# Patient Record
Sex: Male | Born: 1985 | Race: White | Hispanic: No | Marital: Married | Smoking: Former smoker
Health system: Southern US, Community
[De-identification: ages and names within clinical notes are randomized; demographics above are authoritative.]

## PROBLEM LIST (undated history)

## (undated) DIAGNOSIS — Z87442 Personal history of urinary calculi: Secondary | ICD-10-CM

---

## 2015-06-16 ENCOUNTER — Emergency Department (HOSPITAL_COMMUNITY): Payer: 59

## 2015-06-16 ENCOUNTER — Inpatient Hospital Stay (HOSPITAL_COMMUNITY)
Admission: EM | Admit: 2015-06-16 | Discharge: 2015-06-23 | DRG: 023 | Disposition: A | Discharge status: Rehabilitation Facility | Payer: 59 | Attending: General Surgery | Admitting: General Surgery

## 2015-06-16 ENCOUNTER — Encounter (HOSPITAL_COMMUNITY): Payer: Self-pay | Admitting: Emergency Medicine

## 2015-06-16 DIAGNOSIS — J9600 Acute respiratory failure, unspecified whether with hypoxia or hypercapnia: Secondary | ICD-10-CM | POA: Diagnosis present

## 2015-06-16 DIAGNOSIS — K5901 Slow transit constipation: Secondary | ICD-10-CM | POA: Diagnosis not present

## 2015-06-16 DIAGNOSIS — G8111 Spastic hemiplegia affecting right dominant side: Secondary | ICD-10-CM | POA: Diagnosis not present

## 2015-06-16 DIAGNOSIS — Z9289 Personal history of other medical treatment: Secondary | ICD-10-CM

## 2015-06-16 DIAGNOSIS — S0512XA Contusion of eyeball and orbital tissues, left eye, initial encounter: Secondary | ICD-10-CM | POA: Diagnosis present

## 2015-06-16 DIAGNOSIS — R402433 Glasgow coma scale score 3-8, at hospital admission: Secondary | ICD-10-CM | POA: Diagnosis present

## 2015-06-16 DIAGNOSIS — H05232 Hemorrhage of left orbit: Secondary | ICD-10-CM | POA: Diagnosis present

## 2015-06-16 DIAGNOSIS — S0591XS Unspecified injury of right eye and orbit, sequela: Secondary | ICD-10-CM | POA: Diagnosis not present

## 2015-06-16 DIAGNOSIS — Z87820 Personal history of traumatic brain injury: Secondary | ICD-10-CM | POA: Diagnosis present

## 2015-06-16 DIAGNOSIS — G8191 Hemiplegia, unspecified affecting right dominant side: Secondary | ICD-10-CM | POA: Diagnosis present

## 2015-06-16 DIAGNOSIS — W19XXXA Unspecified fall, initial encounter: Secondary | ICD-10-CM | POA: Diagnosis present

## 2015-06-16 DIAGNOSIS — H5704 Mydriasis: Secondary | ICD-10-CM | POA: Diagnosis present

## 2015-06-16 DIAGNOSIS — I609 Nontraumatic subarachnoid hemorrhage, unspecified: Secondary | ICD-10-CM | POA: Diagnosis not present

## 2015-06-16 DIAGNOSIS — W01118A Fall on same level from slipping, tripping and stumbling with subsequent striking against other sharp object, initial encounter: Secondary | ICD-10-CM | POA: Diagnosis present

## 2015-06-16 DIAGNOSIS — H5702 Anisocoria: Secondary | ICD-10-CM | POA: Diagnosis present

## 2015-06-16 DIAGNOSIS — Z23 Encounter for immunization: Secondary | ICD-10-CM

## 2015-06-16 DIAGNOSIS — S069X4S Unspecified intracranial injury with loss of consciousness of 6 hours to 24 hours, sequela: Secondary | ICD-10-CM | POA: Diagnosis not present

## 2015-06-16 DIAGNOSIS — Z978 Presence of other specified devices: Secondary | ICD-10-CM

## 2015-06-16 DIAGNOSIS — S069X0S Unspecified intracranial injury without loss of consciousness, sequela: Secondary | ICD-10-CM | POA: Diagnosis not present

## 2015-06-16 DIAGNOSIS — R339 Retention of urine, unspecified: Secondary | ICD-10-CM | POA: Diagnosis not present

## 2015-06-16 DIAGNOSIS — S066X4S Traumatic subarachnoid hemorrhage with loss of consciousness of 6 hours to 24 hours, sequela: Secondary | ICD-10-CM | POA: Diagnosis not present

## 2015-06-16 DIAGNOSIS — R258 Other abnormal involuntary movements: Secondary | ICD-10-CM | POA: Diagnosis present

## 2015-06-16 DIAGNOSIS — R4182 Altered mental status, unspecified: Secondary | ICD-10-CM | POA: Diagnosis present

## 2015-06-16 DIAGNOSIS — F17210 Nicotine dependence, cigarettes, uncomplicated: Secondary | ICD-10-CM | POA: Diagnosis present

## 2015-06-16 DIAGNOSIS — S069X4A Unspecified intracranial injury with loss of consciousness of 6 hours to 24 hours, initial encounter: Secondary | ICD-10-CM | POA: Diagnosis not present

## 2015-06-16 DIAGNOSIS — S066X9A Traumatic subarachnoid hemorrhage with loss of consciousness of unspecified duration, initial encounter: Secondary | ICD-10-CM | POA: Diagnosis present

## 2015-06-16 DIAGNOSIS — S0591XA Unspecified injury of right eye and orbit, initial encounter: Secondary | ICD-10-CM | POA: Diagnosis present

## 2015-06-16 DIAGNOSIS — R402412 Glasgow coma scale score 13-15, at arrival to emergency department: Secondary | ICD-10-CM | POA: Diagnosis present

## 2015-06-16 DIAGNOSIS — J9690 Respiratory failure, unspecified, unspecified whether with hypoxia or hypercapnia: Secondary | ICD-10-CM

## 2015-06-16 DIAGNOSIS — S06369A Traumatic hemorrhage of cerebrum, unspecified, with loss of consciousness of unspecified duration, initial encounter: Secondary | ICD-10-CM | POA: Diagnosis present

## 2015-06-16 DIAGNOSIS — G9340 Encephalopathy, unspecified: Secondary | ICD-10-CM | POA: Diagnosis not present

## 2015-06-16 DIAGNOSIS — H468 Other optic neuritis: Secondary | ICD-10-CM | POA: Diagnosis present

## 2015-06-16 DIAGNOSIS — J9601 Acute respiratory failure with hypoxia: Secondary | ICD-10-CM | POA: Diagnosis not present

## 2015-06-16 DIAGNOSIS — S066X4A Traumatic subarachnoid hemorrhage with loss of consciousness of 6 hours to 24 hours, initial encounter: Secondary | ICD-10-CM | POA: Diagnosis not present

## 2015-06-16 DIAGNOSIS — S0592XS Unspecified injury of left eye and orbit, sequela: Secondary | ICD-10-CM | POA: Diagnosis not present

## 2015-06-16 DIAGNOSIS — H4901 Third [oculomotor] nerve palsy, right eye: Secondary | ICD-10-CM | POA: Diagnosis present

## 2015-06-16 DIAGNOSIS — G44309 Post-traumatic headache, unspecified, not intractable: Secondary | ICD-10-CM | POA: Diagnosis not present

## 2015-06-16 HISTORY — DX: Personal history of urinary calculi: Z87.442

## 2015-06-16 LAB — TYPE AND SCREEN
ABO/RH(D): A NEG
Antibody Screen: NEGATIVE
Unit division: 0
Unit division: 0

## 2015-06-16 LAB — CBC
HCT: 43 % (ref 39.0–52.0)
HEMOGLOBIN: 14.5 g/dL (ref 13.0–17.0)
MCH: 28 pg (ref 26.0–34.0)
MCHC: 33.7 g/dL (ref 30.0–36.0)
MCV: 83.2 fL (ref 78.0–100.0)
Platelets: 205 10*3/uL (ref 150–400)
RBC: 5.17 MIL/uL (ref 4.22–5.81)
RDW: 13.1 % (ref 11.5–15.5)
WBC: 9.4 10*3/uL (ref 4.0–10.5)

## 2015-06-16 LAB — COMPREHENSIVE METABOLIC PANEL
ALT: 29 U/L (ref 17–63)
ANION GAP: 10 (ref 5–15)
AST: 22 U/L (ref 15–41)
Albumin: 4.1 g/dL (ref 3.5–5.0)
Alkaline Phosphatase: 58 U/L (ref 38–126)
BUN: 11 mg/dL (ref 6–20)
CHLORIDE: 100 mmol/L — AB (ref 101–111)
CO2: 27 mmol/L (ref 22–32)
Calcium: 9 mg/dL (ref 8.9–10.3)
Creatinine, Ser: 1.07 mg/dL (ref 0.61–1.24)
GFR calc non Af Amer: >60 mL/min (ref >60)
Glucose, Bld: 100 mg/dL — ABNORMAL HIGH (ref 65–99)
Potassium: 3.6 mmol/L (ref 3.5–5.1)
SODIUM: 137 mmol/L (ref 135–145)
Total Bilirubin: 0.5 mg/dL (ref 0.3–1.2)
Total Protein: 6.5 g/dL (ref 6.5–8.1)

## 2015-06-16 LAB — ETHANOL

## 2015-06-16 LAB — ABO/RH: ABO/RH(D): A NEG

## 2015-06-16 LAB — PROTIME-INR
INR: 1.07 (ref 0.00–1.49)
Prothrombin Time: 14.1 seconds (ref 11.6–15.2)

## 2015-06-16 MED ORDER — IOHEXOL 300 MG/ML  SOLN
100.0000 mL | Freq: Once | INTRAMUSCULAR | Status: AC | PRN
  Administered 2015-06-16: 100 mL via INTRAVENOUS

## 2015-06-16 MED ORDER — FENTANYL CITRATE (PF) 100 MCG/2ML IJ SOLN
INTRAMUSCULAR | Status: AC
  Administered 2015-06-16: 100 ug
  Filled 2015-06-16: qty 2

## 2015-06-16 MED ORDER — LIDOCAINE-EPINEPHRINE (PF) 2 %-1:200000 IJ SOLN
10.0000 mL | Freq: Once | INTRAMUSCULAR | Status: AC
  Filled 2015-06-16: qty 20

## 2015-06-16 MED ORDER — FENTANYL CITRATE (PF) 100 MCG/2ML IJ SOLN
100.0000 ug | Freq: Once | INTRAMUSCULAR | Status: AC
  Administered 2015-06-16: 100 ug via INTRAVENOUS

## 2015-06-16 MED ORDER — PROPOFOL 1000 MG/100ML IV EMUL
5.0000 ug/kg/min | Freq: Once | INTRAVENOUS | Status: DC
  Administered 2015-06-16: 10 ug/kg/min via INTRAVENOUS

## 2015-06-16 MED ORDER — FENTANYL CITRATE (PF) 100 MCG/2ML IJ SOLN
INTRAMUSCULAR | Status: AC
  Filled 2015-06-16: qty 2

## 2015-06-16 MED ORDER — LIDOCAINE-EPINEPHRINE (PF) 2 %-1:200000 IJ SOLN
INTRAMUSCULAR | Status: AC
  Administered 2015-06-16: 20 mL
  Filled 2015-06-16: qty 20

## 2015-06-16 NOTE — ED Notes (Signed)
100 of roc given

## 2015-06-16 NOTE — ED Notes (Signed)
30 etomidate given
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
## 2015-06-16 NOTE — ED Notes (Signed)
Pt in CT with Everlena Cooper, Primary RN, and Dr. Ladona Ridgel, and Dr. Rush Landmark.
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
## 2015-06-16 NOTE — ED Notes (Signed)
Chaplain and dr. Ladona Ridgel updating family. Lidocaine present

## 2015-06-16 NOTE — ED Notes (Signed)
Repeat toco pressure 23.

## 2015-06-16 NOTE — ED Notes (Signed)
CT room 1 ready

## 2015-06-16 NOTE — ED Notes (Signed)
Primary RN Everlena Cooper, Dr. Madilyn Hook, Dr. Joyice Faster, and Dr. Rush Landmark at the bedside on arrival.

## 2015-06-16 NOTE — ED Notes (Signed)
100mcg of fentanyl given

## 2015-06-16 NOTE — ED Notes (Signed)
Clarified with Dr. Ladona Ridgel and Dr. Madilyn Hook, patient's propofol to be continued for now. Increased to 76mcg/kg/min. Verbal order for repeat dose of fentanyl per Dr. Madilyn Hook.

## 2015-06-16 NOTE — ED Notes (Signed)
Paged Neurosurgery/JONES to [facility] @ [phone]

## 2015-06-16 NOTE — ED Notes (Signed)
100 succ given 

## 2015-06-16 NOTE — ED Notes (Signed)
toco pressure to left eye 40 per Dr. Ladona Ridgel

## 2015-06-16 NOTE — ED Notes (Signed)
AC present in CT.

## 2015-06-17 ENCOUNTER — Encounter (HOSPITAL_COMMUNITY): Payer: Self-pay | Admitting: Radiology

## 2015-06-17 ENCOUNTER — Inpatient Hospital Stay (HOSPITAL_COMMUNITY): Payer: 59

## 2015-06-17 DIAGNOSIS — J9601 Acute respiratory failure with hypoxia: Secondary | ICD-10-CM

## 2015-06-17 DIAGNOSIS — I609 Nontraumatic subarachnoid hemorrhage, unspecified: Secondary | ICD-10-CM

## 2015-06-17 DIAGNOSIS — G9340 Encephalopathy, unspecified: Secondary | ICD-10-CM

## 2015-06-17 LAB — BASIC METABOLIC PANEL
Anion gap: 12 (ref 5–15)
BUN: 10 mg/dL (ref 6–20)
CALCIUM: 8.9 mg/dL (ref 8.9–10.3)
CO2: 27 mmol/L (ref 22–32)
Chloride: 97 mmol/L — ABNORMAL LOW (ref 101–111)
Creatinine, Ser: 0.96 mg/dL (ref 0.61–1.24)
GFR calc Af Amer: >60 mL/min (ref >60)
GFR calc non Af Amer: >60 mL/min (ref >60)
GLUCOSE: 112 mg/dL — AB (ref 65–99)
Potassium: 3.8 mmol/L (ref 3.5–5.1)
Sodium: 136 mmol/L (ref 135–145)

## 2015-06-17 LAB — PREPARE FRESH FROZEN PLASMA: Unit division: 0

## 2015-06-17 LAB — CBC
HCT: 42.2 % (ref 39.0–52.0)
Hemoglobin: 14.1 g/dL (ref 13.0–17.0)
MCH: 28.2 pg (ref 26.0–34.0)
MCHC: 33.4 g/dL (ref 30.0–36.0)
MCV: 84.4 fL (ref 78.0–100.0)
PLATELETS: 208 10*3/uL (ref 150–400)
RBC: 5 MIL/uL (ref 4.22–5.81)
RDW: 13.5 % (ref 11.5–15.5)
WBC: 13.4 10*3/uL — ABNORMAL HIGH (ref 4.0–10.5)

## 2015-06-17 LAB — URINALYSIS, ROUTINE W REFLEX MICROSCOPIC
BILIRUBIN URINE: NEGATIVE
Glucose, UA: NEGATIVE mg/dL
Ketones, ur: NEGATIVE mg/dL
Leukocytes, UA: NEGATIVE
NITRITE: NEGATIVE
Protein, ur: NEGATIVE mg/dL
SPECIFIC GRAVITY, URINE: 1.02 (ref 1.005–1.030)
UROBILINOGEN UA: 0.2 mg/dL (ref 0.0–1.0)
pH: 6 (ref 5.0–8.0)

## 2015-06-17 LAB — BLOOD GAS, ARTERIAL
ACID-BASE EXCESS: 0.9 mmol/L (ref 0.0–2.0)
BICARBONATE: 25.5 meq/L — AB (ref 20.0–24.0)
Drawn by: 41308
FIO2: 0.4
LHR: 14 {breaths}/min
O2 SAT: 99.2 %
PEEP/CPAP: 5 cmH2O
PH ART: 7.377 (ref 7.350–7.450)
Patient temperature: 98.6
TCO2: 26.9 mmol/L (ref 0–100)
VT: 500 mL
pCO2 arterial: 44.5 mmHg (ref 35.0–45.0)
pO2, Arterial: 166 mmHg — ABNORMAL HIGH (ref 80.0–100.0)

## 2015-06-17 LAB — BLOOD PRODUCT ORDER (VERBAL) VERIFICATION

## 2015-06-17 LAB — RAPID URINE DRUG SCREEN, HOSP PERFORMED
Amphetamines: NOT DETECTED
BARBITURATES: NOT DETECTED
Benzodiazepines: NOT DETECTED
COCAINE: NOT DETECTED
Opiates: NOT DETECTED
TETRAHYDROCANNABINOL: NOT DETECTED

## 2015-06-17 LAB — URINE MICROSCOPIC-ADD ON

## 2015-06-17 LAB — MRSA PCR SCREENING: MRSA by PCR: NEGATIVE

## 2015-06-17 MED ORDER — FENTANYL CITRATE (PF) 100 MCG/2ML IJ SOLN
100.0000 ug | Freq: Once | INTRAMUSCULAR | Status: AC
  Administered 2015-06-17: 100 ug via INTRAVENOUS

## 2015-06-17 MED ORDER — CEPHALEXIN 500 MG PO CAPS
500.0000 mg | ORAL_CAPSULE | Freq: Three times a day (TID) | ORAL | Status: DC
  Administered 2015-06-17 (×2): 500 mg via ORAL
  Filled 2015-06-17 (×4): qty 1

## 2015-06-17 MED ORDER — ROCURONIUM BROMIDE 50 MG/5ML IV SOLN
INTRAVENOUS | Status: AC
  Filled 2015-06-17: qty 2

## 2015-06-17 MED ORDER — ACETAMINOPHEN 160 MG/5ML PO SOLN
650.0000 mg | Freq: Four times a day (QID) | ORAL | Status: DC | PRN
  Administered 2015-06-17 – 2015-06-21 (×5): 650 mg via ORAL
  Filled 2015-06-17 (×5): qty 20.3

## 2015-06-17 MED ORDER — IOHEXOL 350 MG/ML SOLN
50.0000 mL | Freq: Once | INTRAVENOUS | Status: AC | PRN
  Administered 2015-06-17: 50 mL via INTRAVENOUS

## 2015-06-17 MED ORDER — ONDANSETRON HCL 4 MG PO TABS
4.0000 mg | ORAL_TABLET | Freq: Four times a day (QID) | ORAL | Status: DC | PRN
Start: 2015-06-17 — End: 2015-06-23

## 2015-06-17 MED ORDER — ONDANSETRON HCL 4 MG/2ML IJ SOLN: 4.0000 mg | Freq: Four times a day (QID) | INTRAMUSCULAR | Status: DC | PRN

## 2015-06-17 MED ORDER — MIDAZOLAM HCL 2 MG/2ML IJ SOLN: 2.0000 mg | INTRAMUSCULAR | Status: DC | PRN

## 2015-06-17 MED ORDER — PROPOFOL 1000 MG/100ML IV EMUL
INTRAVENOUS | Status: AC
  Administered 2015-06-17: 50 ug/kg/min via INTRAVENOUS
  Filled 2015-06-17: qty 100

## 2015-06-17 MED ORDER — PANTOPRAZOLE SODIUM 40 MG PO TBEC
40.0000 mg | DELAYED_RELEASE_TABLET | Freq: Every day | ORAL | Status: DC
  Administered 2015-06-20 – 2015-06-21 (×2): 40 mg via ORAL
  Filled 2015-06-17 (×2): qty 1

## 2015-06-17 MED ORDER — SODIUM CHLORIDE 0.9 % IV SOLN
40.0000 ug/h | INTRAVENOUS | Status: DC
  Administered 2015-06-17: 40 ug/h via INTRAVENOUS
  Filled 2015-06-17: qty 50

## 2015-06-17 MED ORDER — ANTISEPTIC ORAL RINSE SOLUTION (CORINZ)
7.0000 mL | OROMUCOSAL | Status: DC
  Administered 2015-06-17 – 2015-06-18 (×12): 7 mL via OROMUCOSAL

## 2015-06-17 MED ORDER — SODIUM CHLORIDE 0.9 % IV BOLUS (SEPSIS)
1000.0000 mL | Freq: Once | INTRAVENOUS | Status: AC
  Administered 2015-06-18: 1000 mL via INTRAVENOUS

## 2015-06-17 MED ORDER — PANTOPRAZOLE SODIUM 40 MG IV SOLR
40.0000 mg | Freq: Every day | INTRAVENOUS | Status: DC
  Administered 2015-06-17 – 2015-06-19 (×3): 40 mg via INTRAVENOUS
  Filled 2015-06-17 (×3): qty 40

## 2015-06-17 MED ORDER — CIPROFLOXACIN HCL 0.3 % OP SOLN
2.0000 [drp] | Freq: Once | OPHTHALMIC | Status: AC
  Administered 2015-06-17: 2 [drp] via OPHTHALMIC
  Filled 2015-06-17: qty 2.5

## 2015-06-17 MED ORDER — SODIUM CHLORIDE 0.9 % IV SOLN
25.0000 ug/h | INTRAVENOUS | Status: DC
  Administered 2015-06-18: 150 ug/h via INTRAVENOUS
  Administered 2015-06-18: 200 ug/h via INTRAVENOUS
  Administered 2015-06-19: 275 ug/h via INTRAVENOUS
  Filled 2015-06-17 (×4): qty 50

## 2015-06-17 MED ORDER — LIDOCAINE HCL (CARDIAC) 20 MG/ML IV SOLN
INTRAVENOUS | Status: AC
  Filled 2015-06-17: qty 5

## 2015-06-17 MED ORDER — ERYTHROMYCIN 5 MG/GM OP OINT
TOPICAL_OINTMENT | Freq: Three times a day (TID) | OPHTHALMIC | Status: DC
  Administered 2015-06-17: 16:00:00 via OPHTHALMIC
  Administered 2015-06-17 – 2015-06-18 (×3): 1 via OPHTHALMIC
  Administered 2015-06-18: 21:00:00 via OPHTHALMIC
  Administered 2015-06-18: 1 via OPHTHALMIC
  Administered 2015-06-19 – 2015-06-22 (×12): via OPHTHALMIC
  Administered 2015-06-23: 1 via OPHTHALMIC
  Filled 2015-06-17: qty 3.5

## 2015-06-17 MED ORDER — PROPOFOL 1000 MG/100ML IV EMUL
INTRAVENOUS | Status: AC
  Filled 2015-06-17: qty 100

## 2015-06-17 MED ORDER — ROCURONIUM BROMIDE 50 MG/5ML IV SOLN
100.0000 mg | Freq: Once | INTRAVENOUS | Status: AC
  Administered 2015-06-17: 100 mg via INTRAVENOUS
  Filled 2015-06-17: qty 10

## 2015-06-17 MED ORDER — FENTANYL BOLUS VIA INFUSION
50.0000 ug | INTRAVENOUS | Status: DC | PRN
  Filled 2015-06-17: qty 50

## 2015-06-17 MED ORDER — FENTANYL CITRATE (PF) 100 MCG/2ML IJ SOLN
INTRAMUSCULAR | Status: AC
  Administered 2015-06-17: 100 ug
  Filled 2015-06-17: qty 2

## 2015-06-17 MED ORDER — TETANUS-DIPHTH-ACELL PERTUSSIS 5-2.5-18.5 LF-MCG/0.5 IM SUSP
INTRAMUSCULAR | Status: AC
  Filled 2015-06-17: qty 0.5

## 2015-06-17 MED ORDER — PROPOFOL 1000 MG/100ML IV EMUL
5.0000 ug/kg/min | INTRAVENOUS | Status: DC
  Administered 2015-06-17: 45 ug/kg/min via INTRAVENOUS
  Administered 2015-06-17: 70 ug/kg/min via INTRAVENOUS
  Administered 2015-06-17: 80 ug/kg/min via INTRAVENOUS
  Administered 2015-06-17: 60 ug/kg/min via INTRAVENOUS
  Administered 2015-06-17 (×2): 50 ug/kg/min via INTRAVENOUS
  Administered 2015-06-18: 30 ug/kg/min via INTRAVENOUS
  Administered 2015-06-18 (×2): 40 ug/kg/min via INTRAVENOUS
  Filled 2015-06-17 (×3): qty 100
  Filled 2015-06-17: qty 200
  Filled 2015-06-17 (×4): qty 100

## 2015-06-17 MED ORDER — ETOMIDATE 2 MG/ML IV SOLN
INTRAVENOUS | Status: AC
  Filled 2015-06-17: qty 20

## 2015-06-17 MED ORDER — SODIUM CHLORIDE 0.9 % IV SOLN
INTRAVENOUS | Status: DC
  Administered 2015-06-17 – 2015-06-18 (×5): via INTRAVENOUS
  Administered 2015-06-19: 100 mL via INTRAVENOUS
  Administered 2015-06-19 – 2015-06-22 (×6): via INTRAVENOUS

## 2015-06-17 MED ORDER — CEPHALEXIN 250 MG/5ML PO SUSR
500.0000 mg | Freq: Three times a day (TID) | ORAL | Status: DC
  Administered 2015-06-17 – 2015-06-19 (×5): 500 mg
  Filled 2015-06-17 (×8): qty 10

## 2015-06-17 MED ORDER — CHLORHEXIDINE GLUCONATE 0.12% ORAL RINSE (MEDLINE KIT)
15.0000 mL | Freq: Two times a day (BID) | OROMUCOSAL | Status: DC
  Administered 2015-06-17 – 2015-06-19 (×5): 15 mL via OROMUCOSAL

## 2015-06-17 MED ORDER — TETANUS-DIPHTH-ACELL PERTUSSIS 5-2.5-18.5 LF-MCG/0.5 IM SUSP
0.5000 mL | Freq: Once | INTRAMUSCULAR | Status: AC
  Administered 2015-06-17: 0.5 mL via INTRAMUSCULAR

## 2015-06-17 MED ORDER — SUCCINYLCHOLINE CHLORIDE 20 MG/ML IJ SOLN
INTRAMUSCULAR | Status: AC
  Filled 2015-06-17: qty 1

## 2015-06-17 MED ORDER — FENTANYL CITRATE (PF) 100 MCG/2ML IJ SOLN: 50.0000 ug | Freq: Once | INTRAMUSCULAR | Status: AC

## 2015-06-17 MED ORDER — FENTANYL CITRATE (PF) 100 MCG/2ML IJ SOLN: 50.0000 ug | INTRAMUSCULAR | Status: DC | PRN
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
## 2015-06-17 NOTE — Care Management Note (Signed)
Case Management Note  Patient Details  Name: Tyshan Enderle MRN: [id number] Date of Birth: 02-25-1986  Subjective/Objective:                    Action/Plan:  UR completed .  Expected Discharge Date:                  Expected Discharge Plan:     In-House Referral:     Discharge planning Services     Post Acute Care Choice:    Choice offered to:     DME Arranged:    DME Agency:     HH Arranged:    HH Agency:     Status of Service:  In process, will continue to follow  Medicare Important Message Given:    Date Medicare IM Given:    Medicare IM give by:    Date Additional Medicare IM Given:    Additional Medicare Important Message give by:     If discussed at Long Length of Stay Meetings, dates discussed:    Additional Comments:  Kingsley Plan, RN 06/17/2015, 8:07 AM
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
## 2015-06-17 NOTE — ED Notes (Signed)
Awaiting MD to allow transfer to unit, CTA to be done prior to floor per Dr.

## 2015-06-17 NOTE — ED Notes (Signed)
Verbal restraint order from Dr. Ladona Ridgel.
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
## 2015-06-17 NOTE — ED Notes (Signed)
Neurosurgeon at the bedside for placement of bolt. Dr. Ladona Ridgel called to speak to wife to update. Jasmine from 3w present at the bedside.
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
## 2015-06-17 NOTE — ED Notes (Signed)
Preparing for transport to ct. Rocuronium prior to transport.

## 2015-06-17 NOTE — ED Notes (Signed)
Neuro surgeon at the bedside

## 2015-06-17 NOTE — ED Notes (Signed)
Dr. Cathey Endow at the bedside.

## 2015-06-17 NOTE — ED Notes (Signed)
Everlena Cooper, RN, respiratory therapist, and Sharia Reeve, emt travel to CT for CTA.
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
## 2015-06-17 NOTE — Progress Notes (Signed)
   06/17/15 1402  Clinical Encounter Type  Visited With Patient and family together;Health care provider  Visit Type Initial;Spiritual support  Referral From Chaplain  Spiritual Encounters  Spiritual Needs Prayer;Emotional   Chaplain visited with family, offered emotional support and prayer. Chaplain support available as needed.   Alda Ponder, Chaplain 06/17/2015 2:04 PM
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
## 2015-06-18 ENCOUNTER — Inpatient Hospital Stay (HOSPITAL_COMMUNITY): Payer: 59

## 2015-06-18 LAB — POCT I-STAT 3, ART BLOOD GAS (G3+)
Acid-Base Excess: 1 mmol/L (ref 0.0–2.0)
BICARBONATE: 25.6 meq/L — AB (ref 20.0–24.0)
O2 Saturation: 98 %
PCO2 ART: 41.1 mmHg (ref 35.0–45.0)
TCO2: 27 mmol/L (ref 0–100)
pH, Arterial: 7.407 (ref 7.350–7.450)
pO2, Arterial: 118 mmHg — ABNORMAL HIGH (ref 80.0–100.0)

## 2015-06-18 LAB — BASIC METABOLIC PANEL
ANION GAP: 9 (ref 5–15)
BUN: 8 mg/dL (ref 6–20)
CHLORIDE: 105 mmol/L (ref 101–111)
CO2: 26 mmol/L (ref 22–32)
Calcium: 8.5 mg/dL — ABNORMAL LOW (ref 8.9–10.3)
Creatinine, Ser: 1 mg/dL (ref 0.61–1.24)
GFR calc non Af Amer: >60 mL/min (ref >60)
GLUCOSE: 92 mg/dL (ref 65–99)
POTASSIUM: 3.6 mmol/L (ref 3.5–5.1)
Sodium: 140 mmol/L (ref 135–145)

## 2015-06-18 LAB — CBC
HEMATOCRIT: 40.3 % (ref 39.0–52.0)
HEMOGLOBIN: 13.2 g/dL (ref 13.0–17.0)
MCH: 28 pg (ref 26.0–34.0)
MCHC: 32.8 g/dL (ref 30.0–36.0)
MCV: 85.6 fL (ref 78.0–100.0)
Platelets: 141 10*3/uL — ABNORMAL LOW (ref 150–400)
RBC: 4.71 MIL/uL (ref 4.22–5.81)
RDW: 13.6 % (ref 11.5–15.5)
WBC: 8.1 10*3/uL (ref 4.0–10.5)

## 2015-06-18 LAB — TRIGLYCERIDES: Triglycerides: 143 mg/dL (ref <150)

## 2015-06-18 LAB — MAGNESIUM: MAGNESIUM: 2 mg/dL (ref 1.7–2.4)

## 2015-06-18 LAB — PHOSPHORUS: Phosphorus: 2.9 mg/dL (ref 2.5–4.6)

## 2015-06-18 MED ORDER — MIDAZOLAM HCL 2 MG/2ML IJ SOLN
1.0000 mg | INTRAMUSCULAR | Status: DC | PRN
  Administered 2015-06-18 – 2015-06-19 (×3): 2 mg via INTRAVENOUS
  Filled 2015-06-18 (×3): qty 2

## 2015-06-18 MED ORDER — GADOBENATE DIMEGLUMINE 529 MG/ML IV SOLN
20.0000 mL | Freq: Once | INTRAVENOUS | Status: AC | PRN
  Administered 2015-06-18: 20 mL via INTRAVENOUS

## 2015-06-18 MED ORDER — ANTISEPTIC ORAL RINSE SOLUTION (CORINZ)
7.0000 mL | Freq: Four times a day (QID) | OROMUCOSAL | Status: DC
  Administered 2015-06-18 – 2015-06-20 (×8): 7 mL via OROMUCOSAL
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
## 2015-06-18 NOTE — Progress Notes (Signed)
Pt transported down to MRI on vent. Pt placed on 100% throughout transport, no complications noted.

## 2015-06-19 ENCOUNTER — Inpatient Hospital Stay (HOSPITAL_COMMUNITY): Payer: 59

## 2015-06-19 LAB — RENAL FUNCTION PANEL
ALBUMIN: 2.8 g/dL — AB (ref 3.5–5.0)
ANION GAP: 12 (ref 5–15)
BUN: 5 mg/dL — ABNORMAL LOW (ref 6–20)
CALCIUM: 8.5 mg/dL — AB (ref 8.9–10.3)
CO2: 24 mmol/L (ref 22–32)
CREATININE: 0.81 mg/dL (ref 0.61–1.24)
Chloride: 103 mmol/L (ref 101–111)
GFR calc non Af Amer: >60 mL/min (ref >60)
GLUCOSE: 83 mg/dL (ref 65–99)
PHOSPHORUS: 2.3 mg/dL — AB (ref 2.5–4.6)
Potassium: 4 mmol/L (ref 3.5–5.1)
SODIUM: 139 mmol/L (ref 135–145)

## 2015-06-19 LAB — POCT I-STAT 3, ART BLOOD GAS (G3+)
ACID-BASE DEFICIT: 1 mmol/L (ref 0.0–2.0)
Bicarbonate: 24.5 meq/L — ABNORMAL HIGH (ref 20.0–24.0)
O2 SAT: 99 %
PCO2 ART: 46.6 mmHg — AB (ref 35.0–45.0)
TCO2: 26 mmol/L (ref 0–100)
pH, Arterial: 7.333 — ABNORMAL LOW (ref 7.350–7.450)
pO2, Arterial: 138 mmHg — ABNORMAL HIGH (ref 80.0–100.0)

## 2015-06-19 LAB — CBC
HEMATOCRIT: 37 % — AB (ref 39.0–52.0)
HEMOGLOBIN: 12.3 g/dL — AB (ref 13.0–17.0)
MCH: 28.3 pg (ref 26.0–34.0)
MCHC: 33.2 g/dL (ref 30.0–36.0)
MCV: 85.1 fL (ref 78.0–100.0)
Platelets: 146 10*3/uL — ABNORMAL LOW (ref 150–400)
RBC: 4.35 MIL/uL (ref 4.22–5.81)
RDW: 13.2 % (ref 11.5–15.5)
WBC: 9.6 10*3/uL (ref 4.0–10.5)

## 2015-06-19 LAB — TRIGLYCERIDES: TRIGLYCERIDES: 147 mg/dL (ref <150)

## 2015-06-19 LAB — MAGNESIUM: MAGNESIUM: 1.7 mg/dL (ref 1.7–2.4)

## 2015-06-19 MED ORDER — FENTANYL CITRATE (PF) 100 MCG/2ML IJ SOLN
25.0000 ug | INTRAMUSCULAR | Status: DC | PRN
  Administered 2015-06-19 – 2015-06-20 (×9): 100 ug via INTRAVENOUS
  Administered 2015-06-20: 50 ug via INTRAVENOUS
  Administered 2015-06-21: 100 ug via INTRAVENOUS
  Administered 2015-06-21: 50 ug via INTRAVENOUS
  Filled 2015-06-19 (×12): qty 2

## 2015-06-19 MED ORDER — PIVOT 1.5 CAL PO LIQD
1000.0000 mL | ORAL | Status: DC
  Filled 2015-06-19 (×3): qty 1000

## 2015-06-19 MED ORDER — PRO-STAT SUGAR FREE PO LIQD: 60.0000 mL | Freq: Four times a day (QID) | ORAL | Status: DC

## 2015-06-19 MED ORDER — FENTANYL CITRATE (PF) 100 MCG/2ML IJ SOLN
25.0000 ug | INTRAMUSCULAR | Status: DC | PRN
  Administered 2015-06-19: 50 ug via INTRAVENOUS
  Administered 2015-06-19: 100 ug via INTRAVENOUS
  Administered 2015-06-19: 50 ug via INTRAVENOUS
  Filled 2015-06-19 (×3): qty 2
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
## 2015-06-19 NOTE — Procedures (Signed)
Extubation Procedure Note  Patient Details:   Name: Dakota [name] DOB: November 16, 1985 MRN: [id number]   Airway Documentation:  Airway (Active)  Secured at (cm) 25 cm 06/19/2015  7:41 AM  Measured From Lips 06/19/2015  7:41 AM  Secured Location Right 06/19/2015  7:41 AM  Secured By [employer] 06/19/2015  7:41 AM  Tube Holder Repositioned Yes 06/19/2015  7:41 AM  Cuff Pressure (cm H2O) 24 cm H2O 06/19/2015  7:41 AM  Site Condition Dry 06/19/2015  7:41 AM    Evaluation  O2 sats: stable throughout Complications: No apparent complications Patient did tolerate procedure well. Bilateral Breath Sounds: Clear Suctioning: Oral, Airway Yes   Positive cuff leak prior to extubation   Ok Anis, MA 06/19/2015, 10:47 AM

## 2015-06-19 NOTE — Progress Notes (Signed)
PULMONARY / CRITICAL CARE MEDICINE   Name: Dakota [name] MRN: [id number] DOB: Jan 20, 1986    ADMISSION DATE:  06/16/2015 CONSULTATION DATE:  06/17/2015  REFERRING MD :  Trauma MD  CHIEF COMPLAINT:  Vent management  INITIAL PRESENTATION: 29 year old male with no significant PMH who came back from grocery shopping.  Was in his garage then called out to his wife.  She came in he was in the floor, his left eye was proptotic and bleeding.  Brought to the ED, was oriented only to self and only said antenna.  Was noted to not be protecting his airway and had a SAH.  Was sedated and intubated.  Left pupil at the time was blown.  PCCM consulted for vent and ICU management.  STUDIES:  Head CT 10/6 with SAH  SIGNIFICANT EVENTS: 10/6 unknown trauma, intubated.  HISTORY OF PRESENT ILLNESS:  29 year old male with no significant PMH who came back from grocery shopping.  Was in his garage then called out to his wife.  She came in he was in the floor, his left eye was proptotic and bleeding.  Brought to the ED, was oriented only to self and only said antenna.  Was noted to not be protecting his airway and had a SAH.  Was sedated and intubated.  Left pupil at the time was blown.  PCCM consulted for vent and ICU management.  SUBJECTIVE: Awake and interactive on the left not right.  VITAL SIGNS: Temp:  [98.8 F (37.1 C)-101.8 F (38.8 C)] 99.9 F (37.7 C) (10/09 0900) Pulse Rate:  [58-124] 96 (10/09 0900) Resp:  [13-19] 14 (10/09 0900) BP: (102-145)/(49-89) 124/65 mmHg (10/09 0900) SpO2:  [87 %-100 %] 98 % (10/09 0900) FiO2 (%):  [30 %-40 %] 30 % (10/09 0823) HEMODYNAMICS:   VENTILATOR SETTINGS: Vent Mode:  [-] PRVC FiO2 (%):  [30 %-40 %] 30 % Set Rate:  [14 bmp] 14 bmp Vt Set:  [500 mL] 500 mL PEEP:  [5 cmH20] 5 cmH20 Pressure Support:  [10 cmH20] 10 cmH20 Plateau Pressure:  [13 cmH20-19 cmH20] 15 cmH20 INTAKE / OUTPUT:  Intake/Output Summary (Last 24 hours) at 06/19/15 0959 Last data filed  at 06/19/15 0900  Gross per 24 hour  Intake 3154.07 ml  Output   2223 ml  Net 931.07 ml    PHYSICAL EXAMINATION: General:  Well built male, acutely ill.  Alert and interactive. Neuro:  Intubated, moves left to command, right is paralyzed. HEENT:  Ventric out, left eye proptotic and pupil is dilated and non-reactive. Cardiovascular:  RRR, Nl S1/S2, -M/R/G. Lungs:  CTA bilaterally. Abdomen:  Soft, NT, ND and +BS. Musculoskeletal:  -edema and -tenderness. Skin:  Intact.  LABS:  CBC  Recent Labs Lab 06/17/15 0350 06/18/15 0230 06/19/15 0516  WBC 13.4* 8.1 9.6  HGB 14.1 13.2 12.3*  HCT 42.2 40.3 37.0*  PLT 208 141* 146*   Coag's  Recent Labs Lab 06/16/15 2158  INR 1.07   BMET  Recent Labs Lab 06/17/15 0350 06/18/15 0230 06/19/15 0516  NA 136 140 139  K 3.8 3.6 4.0  CL 97* 105 103  CO2 BUN 10 8 5*  CREATININE 0.96 1.00 0.81  GLUCOSE 112* 92 83   Electrolytes  Recent Labs Lab 06/17/15 0350 06/18/15 0230 06/19/15 0516  CALCIUM 8.9 8.5* 8.5*  MG  --  2.0 1.7  PHOS  --  2.9 2.3*   Sepsis Markers No results for input(s): LATICACIDVEN, PROCALCITON, O2SATVEN in the  last 168 hours. ABG  Recent Labs Lab 06/17/15 0230 06/18/15 0351 06/19/15 0359  PHART 7.377 7.407 7.333*  PCO2ART 44.5 41.1 46.6*  PO2ART 166* 118.0* 138.0*   Liver Enzymes  Recent Labs Lab 06/16/15 2158 06/19/15 0516  AST 22  --   ALT 29  --   ALKPHOS 58  --   BILITOT 0.5  --   ALBUMIN 4.1 2.8*   Cardiac Enzymes No results for input(s): TROPONINI, PROBNP in the last 168 hours. Glucose No results for input(s): GLUCAP in the last 168 hours.  Imaging Mr [name] Head Wo Contrast  06/18/2015   CLINICAL DATA:  Subarachnoid hemorrhage.  EXAM: MRI HEAD WITHOUT AND WITH CONTRAST  MRA HEAD WITHOUT CONTRAST  TECHNIQUE: Multiplanar, multiecho pulse sequences of the brain and surrounding structures were obtained without and with intravenous contrast. Angiographic images of the  head were obtained using MRA technique without contrast.  CONTRAST:  20mL MULTIHANCE GADOBENATE DIMEGLUMINE 529 MG/ML IV SOLN  COMPARISON:  Head CT 06/18/2015.  Head CTA 06/17/2015.  FINDINGS: MRI HEAD FINDINGS  A right frontal burr hole is noted related to prior intracranial pressure monitor. Abnormal FLAIR signal with in the basal cisterns is consistent with previously demonstrated subarachnoid hemorrhage. FLAIR hyperintensity within multiple cerebral sulci may represent a combination of subarachnoid hemorrhage and hyperoxygenation as the patient is on a ventilator. Trace hemorrhage is again seen in the occipital horns of the lateral ventricles.  As seen on head CT earlier today, there is a focus of hemorrhage in the left cerebral peduncle which measures 1.3 cm and has mild surrounding vasogenic edema. Abnormal diffusion-weighted signal around the periphery of this hemorrhage is favored to be artifactual due to the blood products. No sizable acute infarct is identified.  The ventricles and sulci are normal in size. Cerebellar tonsils project at or minimally below the foramen magnum. No midline shift or extra-axial fluid collection is present. No abnormal enhancement is identified.  Left-sided proptosis and left orbital preseptal and postseptal swelling/ hematoma are noted as previously described. There is mild bilateral ethmoid, sphenoid, and maxillary sinus mucosal thickening. Mastoid air cells are clear. Major intracranial vascular flow voids are preserved.  MRA HEAD FINDINGS  The visualized distal vertebral arteries are patent with the right being slightly larger than the left. PICA, AICA, and SCA origins are patent. Basilar artery is patent without stenosis. PCAs are unremarkable. There are patent posterior communicating arteries bilaterally.  Internal carotid arteries are patent from skullbase to carotid termini without stenosis. There is a patent and robust anterior communicating artery. ACAs and MCAs are  unremarkable. No intracranial aneurysm is identified.  IMPRESSION: 1. Subarachnoid hemorrhage predominantly in the basilar cisterns as seen on recent CTs. Trace intraventricular hemorrhage. 2. 1.3 cm focus of hemorrhage in the left cerebral peduncle with mild surrounding edema. This is favored to reflect a hemorrhage secondary to the patient's recent trauma rather than a pre-existing lesion or cause of the subarachnoid hemorrhage. 3. Unremarkable head MRA.   Electronically Signed   By: Sebastian Ache M.D.   On: 06/18/2015 14:57   Mr [name] DG Contrast  06/18/2015   CLINICAL DATA:  Subarachnoid hemorrhage.  EXAM: MRI HEAD WITHOUT AND WITH CONTRAST  MRA HEAD WITHOUT CONTRAST  TECHNIQUE: Multiplanar, multiecho pulse sequences of the brain and surrounding structures were obtained without and with intravenous contrast. Angiographic images of the head were obtained using MRA technique without contrast.  CONTRAST:  20mL MULTIHANCE GADOBENATE DIMEGLUMINE 529 MG/ML IV SOLN  COMPARISON:  Head CT 06/18/2015.  Head CTA 06/17/2015.  FINDINGS: MRI HEAD FINDINGS  A right frontal burr hole is noted related to prior intracranial pressure monitor. Abnormal FLAIR signal with in the basal cisterns is consistent with previously demonstrated subarachnoid hemorrhage. FLAIR hyperintensity within multiple cerebral sulci may represent a combination of subarachnoid hemorrhage and hyperoxygenation as the patient is on a ventilator. Trace hemorrhage is again seen in the occipital horns of the lateral ventricles.  As seen on head CT earlier today, there is a focus of hemorrhage in the left cerebral peduncle which measures 1.3 cm and has mild surrounding vasogenic edema. Abnormal diffusion-weighted signal around the periphery of this hemorrhage is favored to be artifactual due to the blood products. No sizable acute infarct is identified.  The ventricles and sulci are normal in size. Cerebellar tonsils project at or minimally below the foramen  magnum. No midline shift or extra-axial fluid collection is present. No abnormal enhancement is identified.  Left-sided proptosis and left orbital preseptal and postseptal swelling/ hematoma are noted as previously described. There is mild bilateral ethmoid, sphenoid, and maxillary sinus mucosal thickening. Mastoid air cells are clear. Major intracranial vascular flow voids are preserved.  MRA HEAD FINDINGS  The visualized distal vertebral arteries are patent with the right being slightly larger than the left. PICA, AICA, and SCA origins are patent. Basilar artery is patent without stenosis. PCAs are unremarkable. There are patent posterior communicating arteries bilaterally.  Internal carotid arteries are patent from skullbase to carotid termini without stenosis. There is a patent and robust anterior communicating artery. ACAs and MCAs are unremarkable. No intracranial aneurysm is identified.  IMPRESSION: 1. Subarachnoid hemorrhage predominantly in the basilar cisterns as seen on recent CTs. Trace intraventricular hemorrhage. 2. 1.3 cm focus of hemorrhage in the left cerebral peduncle with mild surrounding edema. This is favored to reflect a hemorrhage secondary to the patient's recent trauma rather than a pre-existing lesion or cause of the subarachnoid hemorrhage. 3. Unremarkable head MRA.   Electronically Signed   By: Sebastian Ache M.D.   On: 06/18/2015 14:57   Dg Chest Port 1 View  06/19/2015   CLINICAL DATA:  Follow of endotracheal tube placement  EXAM: PORTABLE CHEST 1 VIEW  COMPARISON:  06/18/2015  FINDINGS: Endotracheal tube tip is 4 cm above the carina. Nasogastric tube enters the stomach. Volume loss persists in both lower lobes but is improving slightly. No worsening or new findings.  IMPRESSION: Improving infiltrates/ volume loss in the lower lobes.   Electronically Signed   By: Paulina Fusi M.D.   On: 06/19/2015 08:06   ASSESSMENT / PLAN:  PULMONARY OETT 10/6>>> A: VDRF due to neuro  condition. P:   - SBT for potential extubation today. - Neuro status significantly improved, would deserve a shot at extubation and clearance of the neck prior to trach/peg and family is aware and ok with that. - Titrate O2 for sat of 88-92%. - VAP prevention.  CARDIOVASCULAR CVL PIV A: No active issues. P:  - Hydrate NS 100 ml/hr. - Maintain BP.  RENAL A:  Mild hypochloremia. P:   - IVF hydration. - BMET in AM. - Replace electrolytes as indicated.  GASTROINTESTINAL A:  No active issues. P:   - PPI - If not extubatable today then will start TF.  HEMATOLOGIC A:  Hg stable. P:  - CBC in AM. - Transfuse per ICU protocol.  INFECTIOUS A:  No signs of active infection. P:   - Keflex for surgical prophylaxis 10/6>>> -  Monitor WBC and fever curve.  ENDOCRINE A:  No active issues.   P:   - Monitor blood sugar on BMET.  NEUROLOGIC A:  SAH Likely traumatic brain injury Ventric in place. P:   - D/C fentanyl drip and change to pushes. - NS following. - RASS score of 0 to -2. - Versed pushes.  FAMILY  - Updates: Father and wife updated bedside.  PCCM will see over the weekend for vent and ICU management.  The patient is critically ill with multiple organ systems failure and requires high complexity decision making for assessment and support, frequent evaluation and titration of therapies, application of advanced monitoring technologies and extensive interpretation of multiple databases.   Critical Care Time devoted to patient care services described in this note is  35  Minutes. This time reflects time of care of this signee Dr Koren Bound. This critical care time does not reflect procedure time, or teaching time or supervisory time of PA/NP/Med student/Med Resident etc but could involve care discussion time.  Alyson Reedy, M.D. [facility] Pulmonary/Critical Care Medicine. Pager: [phone]. After hours pager: [phone].  06/19/2015, 9:59 AM

## 2015-06-19 NOTE — Progress Notes (Signed)
Brief Nutrition Note  Consult received for enteral/tube feeding initiation and management.  Adult Enteral Nutrition Protocol initiated. RD to follow-up 10/10.  Admitting Dx: SAH (subarachnoid hemorrhage) (HCC) [I60.9]  Body mass index is 27.22 kg/(m^2). Pt meets criteria for overweight based on current BMI.  Labs:   Recent Labs Lab 06/17/15 0350 06/18/15 0230 06/19/15 0516  NA 136 140 139  K 3.8 3.6 4.0  CL 97* 105 103  CO2 BUN 10 8 5*  CREATININE 0.96 1.00 0.81  CALCIUM 8.9 8.5* 8.5*  MG  --  2.0 1.7  PHOS  --  2.9 2.3*  GLUCOSE 112* 92 83    Tilda Franco, [place], [place], LDN Pager: [phone] After Hours Pager: [phone]

## 2015-06-19 NOTE — Progress Notes (Signed)
Follow up - Trauma and Critical Care  Patient Details:    Dakota [name] is an 29 y.o. male.  Lines/tubes : Airway (Active)  Secured at (cm) 25 cm 06/19/2015  7:41 AM  Measured From Lips 06/19/2015  7:41 AM  Secured Location Right 06/19/2015  7:41 AM  Secured By [employer] 06/19/2015  7:41 AM  Tube Holder Repositioned Yes 06/19/2015  7:41 AM  Cuff Pressure (cm H2O) 24 cm H2O 06/19/2015  7:41 AM  Site Condition Dry 06/19/2015  7:41 AM     NG/OG Tube Orogastric Right mouth (Active)  Placement Verification Auscultation 06/19/2015  8:00 AM  Site Assessment Clean;Intact;Dry 06/19/2015  8:00 AM  Status Clamped 06/19/2015  8:00 AM  Drainage Appearance Bile 06/19/2015  7:00 AM  Output (mL) 400 mL 06/19/2015  6:00 AM     Urethral Catheter 30335 Temperature probe 16 Fr. (Active)  Indication for Insertion or Continuance of Catheter Unstable critical patients (first 24-48 hours) 06/19/2015  8:00 AM  Site Assessment Clean;Intact 06/19/2015  8:00 AM  Catheter Maintenance Bag below level of bladder 06/19/2015  8:00 AM  Collection Container Standard drainage bag 06/19/2015  8:00 AM  Securement Method Securing device (Describe) 06/19/2015  8:00 AM  Urinary Catheter Interventions Unclamped 06/19/2015  8:00 AM  Output (mL) 100 mL 06/19/2015  7:25 AM    Microbiology/Sepsis markers: Results for orders placed or performed during the hospital encounter of 06/16/15  MRSA PCR Screening     Status: None   Collection Time: 06/17/15  2:22 AM  Result Value Ref Range Status   MRSA by PCR NEGATIVE NEGATIVE Final    Comment:        The GeneXpert MRSA Assay (FDA approved for NASAL specimens only), is one component of a comprehensive MRSA colonization surveillance program. It is not intended to diagnose MRSA infection nor to guide or monitor treatment for MRSA infections.     Anti-infectives:  Anti-infectives    Start     Dose/Rate Route Frequency Ordered Stop   06/17/15 2200  cephALEXin (KEFLEX) 250  MG/5ML suspension 500 mg     500 mg Per Tube 3 times per day 06/17/15 2136 06/22/15 0559   06/17/15 0600  cephALEXin (KEFLEX) capsule 500 mg  Status:  Discontinued     500 mg Oral 3 times per day 06/17/15 0145 06/17/15 2136      Best Practice/Protocols:  VTE Prophylaxis: Mechanical GI Prophylaxis: Proton Pump Inhibitor Continous Sedation  Consults: Treatment Team:  Tia Alert, MD    Events:  Subjective:    Overnight Issues: Waking up more   Objective:  Vital signs for last 24 hours: Temp:  [98.8 F (37.1 C)-102.6 F (39.2 C)] 99.7 F (37.6 C) (10/09 0800) Pulse Rate:  [58-144] 101 (10/09 0823) Resp:  [13-19] 14 (10/09 0823) BP: (102-145)/(49-89) 136/66 mmHg (10/09 0800) SpO2:  [87 %-100 %] 98 % (10/09 0800) FiO2 (%):  [30 %-40 %] 30 % (10/09 0823)  Hemodynamic parameters for last 24 hours:    Intake/Output from previous day: 10/08 0701 - 10/09 0700 In: 3327.5 [I.V.:3327.5] Out: 1983 [Urine:1583; Emesis/NG output:400]  Intake/Output this shift: Total I/O In: 127.5 [I.V.:127.5] Out: 100 [Urine:100]  Vent settings for last 24 hours: Vent Mode:  [-] PRVC FiO2 (%):  [30 %-40 %] 30 % Set Rate:  [14 bmp] 14 bmp Vt Set:  [500 mL] 500 mL PEEP:  [5 cmH20] 5 cmH20 Pressure Support:  [10 cmH20] 10 cmH20 Plateau Pressure:  [13 cmH20-19 cmH20] 15  cmH20  Physical Exam:  General: on vent follow commands Neuro: weakness right upper extremity and weakness right lower extremity HEENT/Neck: intubated  CVS: regular rate and rhythm, S1, S2 normal, no murmur, click, rub or gallop GI: soft, nontender, BS WNL, no r/g Skin: no rash Extremities: no edema, no erythema, pulses WNL  GCS  12 follows commands   Results for orders placed or performed during the hospital encounter of 06/16/15 (from the past 24 hour(s))  Triglycerides     Status: None   Collection Time: 06/18/15  6:15 PM  Result Value Ref Range   Triglycerides 143 <150 mg/dL  I-STAT 3, arterial blood gas (G3+)      Status: Abnormal   Collection Time: 06/19/15  3:59 AM  Result Value Ref Range   pH, Arterial 7.333 (L) 7.350 - 7.450   pCO2 arterial 46.6 (H) 35.0 - 45.0 mmHg   pO2, Arterial 138.0 (H) 80.0 - 100.0 mmHg   Bicarbonate 24.5 (H) 20.0 - 24.0 mEq/L   TCO2 26 0 - 100 mmol/L   O2 Saturation 99.0 %   Acid-base deficit 1.0 0.0 - 2.0 mmol/L   Patient temperature 100.4 F    Collection site RADIAL, ALLEN'S TEST ACCEPTABLE    Drawn by RT    Sample type ARTERIAL   Triglycerides     Status: None   Collection Time: 06/19/15  5:16 AM  Result Value Ref Range   Triglycerides 147 <150 mg/dL  CBC     Status: Abnormal   Collection Time: 06/19/15  5:16 AM  Result Value Ref Range   WBC 9.6 4.0 - 10.5 K/uL   RBC 4.35 4.22 - 5.81 MIL/uL   Hemoglobin 12.3 (L) 13.0 - 17.0 g/dL   HCT 16.1 (L) 09.6 - 04.5 %   MCV 85.1 78.0 - 100.0 fL   MCH 28.3 26.0 - 34.0 pg   MCHC 33.2 30.0 - 36.0 g/dL   RDW 40.9 81.1 - 91.4 %   Platelets 146 (L) 150 - 400 K/uL  Magnesium     Status: None   Collection Time: 06/19/15  5:16 AM  Result Value Ref Range   Magnesium 1.7 1.7 - 2.4 mg/dL  Renal function panel     Status: Abnormal   Collection Time: 06/19/15  5:16 AM  Result Value Ref Range   Sodium 139 135 - 145 mmol/L   Potassium 4.0 3.5 - 5.1 mmol/L   Chloride 103 101 - 111 mmol/L   CO2 24 22 - 32 mmol/L   Glucose, Bld 83 65 - 99 mg/dL   BUN 5 (L) 6 - 20 mg/dL   Creatinine, Ser 7.82 0.61 - 1.24 mg/dL   Calcium 8.5 (L) 8.9 - 10.3 mg/dL   Phosphorus 2.3 (L) 2.5 - 4.6 mg/dL   Albumin 2.8 (L) 3.5 - 5.0 g/dL   GFR calc non Af Amer >60 >60 mL/min   GFR calc Af Amer >60 >60 mL/min   Anion gap 12 5 - 15     Assessment/Plan:   LOS: 3 days  NEURO   intracranial injury   Plan: NSG following, ct head with punctate lesion, likely remove bolt today and will get mri  PULM  neuro assoc acute resp failure   Plan: continue PRVCfor now  Start to wean today   CARDIO  HD stable   Plan: continue  monitoring  RENAL  no acute abnormality   Plan: follow electrolyes  GI  no acute abnormality   Plan: attempt to start tube feeds  ID  no acute infection   Plan: prophylaxis for ICPm  HEME  ICH   Plan: mech prophylaxis  ENDO no acute abnormality   Plan: continue glc monitoring            Additional comments:None  Critical Care Total Time*: 30 Minutes  Darcell Yacoub A. 06/19/2015  *Care during the described time interval was provided by me and/or other providers on the critical care team.  I have reviewed this patient's available data, including medical history, events of note, physical examination and test results as part of my evaluation.

## 2015-06-19 NOTE — Progress Notes (Signed)
Order for swallow evaluation completed, will be completed next date. Thanks for this order. Donavan Burnet, MS [facility] SLP [phone]

## 2015-06-19 NOTE — Progress Notes (Signed)
Patient ID: Dakota [name], male   DOB: 03-26-1986, 29 y.o.   MRN: [id number]  :Patient about to be extubated Awake following commands Cervical spine cleared clinically - collar removed.  OK for extubation.  [name]. Corliss Skains, MD, [facility]  General/ Trauma Surgery  06/19/2015 10:45 AM

## 2015-06-19 NOTE — Progress Notes (Signed)
Pt seen and examined this am. No issues overnight. Able to answer questions, denies pain.  EXAM: Temp:  [99.1 F (37.3 C)-100.6 F (38.1 C)] 100 F (37.8 C) (10/09 1900) Pulse Rate:  [58-116] 85 (10/09 1900) Resp:  [8-22] 15 (10/09 1900) BP: (111-149)/(49-101) 118/56 mmHg (10/09 1900) SpO2:  [87 %-100 %] 99 % (10/09 1900) FiO2 (%):  [30 %-40 %] 30 % (10/09 0823) Weight:  [88.6 kg (195 lb 5.2 oz)] 88.6 kg (195 lb 5.2 oz) (10/09 0500) Intake/Output      10/09 0701 - 10/10 0700   I.V. (mL/kg) 1368.9 (15.5)   Total Intake(mL/kg) 1368.9 (15.5)   Urine (mL/kg/hr) 1445 (1.2)   Emesis/NG output    Total Output 1445   Net -76.1        Awake, alert Answers simple questions appropriately Breathing spontaneously Good strength LUE/LLE Plegic RUE/RLE  LABS: Lab Results  Component Value Date   CREATININE 0.81 06/19/2015   BUN 5* 06/19/2015   NA 139 06/19/2015   K 4.0 06/19/2015   CL 103 06/19/2015   CO2 24 06/19/2015   Lab Results  Component Value Date   WBC 9.6 06/19/2015   HGB 12.3* 06/19/2015   HCT 37.0* 06/19/2015   MCV 85.1 06/19/2015   PLT 146* 06/19/2015    IMAGING: MRI demonstrates hemorrhagic contusion of left crus cerebri.  IMPRESSION: - 29 y.o. male s/p trauma with left hemiplegia from a hemorrhagic contusion of the left cerebral peduncle.  PLAN: - Cont supportive care

## 2015-06-19 NOTE — Progress Notes (Signed)
Per Dr. Molli Knock wait for SLP evaluation before starting tube feeding.

## 2015-06-20 ENCOUNTER — Inpatient Hospital Stay (HOSPITAL_COMMUNITY): Payer: 59

## 2015-06-20 ENCOUNTER — Encounter (HOSPITAL_COMMUNITY): Payer: Self-pay | Admitting: Physical Medicine and Rehabilitation

## 2015-06-20 DIAGNOSIS — S069X0S Unspecified intracranial injury without loss of consciousness, sequela: Secondary | ICD-10-CM

## 2015-06-20 DIAGNOSIS — G44309 Post-traumatic headache, unspecified, not intractable: Secondary | ICD-10-CM

## 2015-06-20 LAB — MAGNESIUM: MAGNESIUM: 1.8 mg/dL (ref 1.7–2.4)

## 2015-06-20 LAB — CBC
HCT: 36.7 % — ABNORMAL LOW (ref 39.0–52.0)
HEMOGLOBIN: 12.1 g/dL — AB (ref 13.0–17.0)
MCH: 27.7 pg (ref 26.0–34.0)
MCHC: 33 g/dL (ref 30.0–36.0)
MCV: 84 fL (ref 78.0–100.0)
PLATELETS: 164 10*3/uL (ref 150–400)
RBC: 4.37 MIL/uL (ref 4.22–5.81)
RDW: 12.7 % (ref 11.5–15.5)
WBC: 7.7 10*3/uL (ref 4.0–10.5)

## 2015-06-20 LAB — GLUCOSE, CAPILLARY
GLUCOSE-CAPILLARY: 86 mg/dL (ref 65–99)
GLUCOSE-CAPILLARY: 88 mg/dL (ref 65–99)
GLUCOSE-CAPILLARY: 99 mg/dL (ref 65–99)
Glucose-Capillary: 89 mg/dL (ref 65–99)
Glucose-Capillary: 90 mg/dL (ref 65–99)
Glucose-Capillary: 107 mg/dL — ABNORMAL HIGH (ref 65–99)

## 2015-06-20 LAB — BASIC METABOLIC PANEL
ANION GAP: 8 (ref 5–15)
CHLORIDE: 102 mmol/L (ref 101–111)
CO2: 25 mmol/L (ref 22–32)
Calcium: 8.6 mg/dL — ABNORMAL LOW (ref 8.9–10.3)
Creatinine, Ser: 0.71 mg/dL (ref 0.61–1.24)
GFR calc Af Amer: >60 mL/min (ref >60)
Glucose, Bld: 92 mg/dL (ref 65–99)
POTASSIUM: 4 mmol/L (ref 3.5–5.1)
SODIUM: 135 mmol/L (ref 135–145)

## 2015-06-20 LAB — POCT I-STAT 3, ART BLOOD GAS (G3+)
ACID-BASE DEFICIT: 2 mmol/L (ref 0.0–2.0)
Bicarbonate: 23.1 meq/L (ref 20.0–24.0)
O2 SAT: 98 %
PCO2 ART: 42.1 mmHg (ref 35.0–45.0)
PH ART: 7.349 — AB (ref 7.350–7.450)
PO2 ART: 112 mmHg — AB (ref 80.0–100.0)
Patient temperature: 99.1
TCO2: 24 mmol/L (ref 0–100)

## 2015-06-20 LAB — PHOSPHORUS: Phosphorus: 2 mg/dL — ABNORMAL LOW (ref 2.5–4.6)

## 2015-06-20 MED ORDER — RESOURCE THICKENUP CLEAR PO POWD
ORAL | Status: DC | PRN
  Filled 2015-06-20: qty 125

## 2015-06-20 MED ORDER — INFLUENZA VAC SPLIT QUAD 0.5 ML IM SUSY
0.5000 mL | PREFILLED_SYRINGE | INTRAMUSCULAR | Status: DC
  Filled 2015-06-20: qty 0.5

## 2015-06-20 MED ORDER — ENSURE ENLIVE PO LIQD
237.0000 mL | Freq: Three times a day (TID) | ORAL | Status: DC
  Administered 2015-06-20 – 2015-06-23 (×8): 237 mL via ORAL
  Filled 2015-06-20 (×9): qty 237

## 2015-06-20 MED ORDER — OXYCODONE HCL 5 MG PO TABS
5.0000 mg | ORAL_TABLET | ORAL | Status: DC | PRN
  Administered 2015-06-20: 10 mg via ORAL
  Administered 2015-06-20 (×2): 5 mg via ORAL
  Administered 2015-06-20 – 2015-06-21 (×2): 10 mg via ORAL
  Administered 2015-06-21: 5 mg via ORAL
  Administered 2015-06-21: 10 mg via ORAL
  Filled 2015-06-20: qty 1
  Filled 2015-06-20 (×4): qty 2
  Filled 2015-06-20: qty 1
  Filled 2015-06-20 (×2): qty 2

## 2015-06-20 NOTE — Progress Notes (Signed)
   06/20/15 1447  Clinical Encounter Type  Visited With Family  Visit Type Follow-up  Spiritual Encounters  Spiritual Needs Prayer   Chaplain met family in waiting area, followed up with them, and offered support and prayer. Chaplain support available as needed.   Jeri Lager, Chaplain 06/20/2015 2:47 PM
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
## 2015-06-20 NOTE — Progress Notes (Signed)
PT Cancellation Note  Patient Details Name: Dakota [name] MRN: [id number] DOB: [birth year]   Cancelled Treatment:    Reason Eval/Treat Not Completed: Fatigue/lethargy limiting ability to participate.  Pt very painful this am and needing pain meds.  Meds have made pt very drowsy and limiting ability to participate at this time.  Will try back another time as able.     Sunny Schlein, [place] [phone] 06/20/2015, 9:19 AM

## 2015-06-20 NOTE — Progress Notes (Addendum)
Rehab Admissions Coordinator Note:  Patient was screened by [name] for appropriateness for an Inpatient Acute Rehab Consult per PT recommendation.   At this time, we are recommending Inpatient Rehab consult. OT consult also recommended.  [name] 06/20/2015, 7:38 AM  I can be reached at [phone].

## 2015-06-21 LAB — GLUCOSE, CAPILLARY
GLUCOSE-CAPILLARY: 111 mg/dL — AB (ref 65–99)
Glucose-Capillary: 88 mg/dL (ref 65–99)

## 2015-06-21 MED ORDER — MORPHINE SULFATE (PF) 2 MG/ML IV SOLN
2.0000 mg | INTRAVENOUS | Status: DC | PRN
  Administered 2015-06-21 – 2015-06-22 (×3): 2 mg via INTRAVENOUS
  Filled 2015-06-21 (×4): qty 1

## 2015-06-21 MED ORDER — TAMSULOSIN HCL 0.4 MG PO CAPS
0.8000 mg | ORAL_CAPSULE | Freq: Every day | ORAL | Status: DC
  Administered 2015-06-21 – 2015-06-23 (×3): 0.8 mg via ORAL
  Filled 2015-06-21 (×3): qty 2

## 2015-06-21 MED ORDER — BETHANECHOL CHLORIDE 25 MG PO TABS
25.0000 mg | ORAL_TABLET | Freq: Four times a day (QID) | ORAL | Status: DC
  Administered 2015-06-21 (×3): 25 mg via ORAL
  Filled 2015-06-21 (×5): qty 1

## 2015-06-21 MED ORDER — OXYCODONE HCL 5 MG PO TABS
10.0000 mg | ORAL_TABLET | ORAL | Status: DC | PRN
  Administered 2015-06-21 (×3): 15 mg via ORAL
  Filled 2015-06-21 (×3): qty 3

## 2015-06-21 MED ORDER — RESOURCE THICKENUP CLEAR PO POWD
ORAL | Status: DC | PRN
  Filled 2015-06-21: qty 125

## 2015-06-21 MED ORDER — TRAMADOL HCL 50 MG PO TABS
50.0000 mg | ORAL_TABLET | Freq: Four times a day (QID) | ORAL | Status: DC
  Administered 2015-06-21 – 2015-06-22 (×4): 50 mg via ORAL
  Filled 2015-06-21 (×4): qty 1
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
## 2015-06-21 NOTE — Progress Notes (Signed)
Patient transferred form unit 29M to room 5M08 at 1600. Alert and in stable condition.

## 2015-06-21 NOTE — Progress Notes (Signed)
Trauma Service Note  Subjective: Patient sitting up in chair.  Still very weak on the right side, but has some sensation.  Improved on swallowing and will likely be upgraded to thin liquids.  Having issues with eye pain  Objective: Vital signs in last 24 hours: Temp:  [98.5 F (36.9 C)-100.1 F (37.8 C)] 98.8 F (37.1 C) (10/11 0802) Pulse Rate:  [69-105] 76 (10/11 0800) Resp:  [10-18] 10 (10/11 0800) BP: (110-132)/(63-87) 126/64 mmHg (10/11 0800) SpO2:  [93 %-98 %] 95 % (10/11 0800)    Intake/Output from previous day: 10/10 0701 - 10/11 0700 In: 3065 [P.O.:665; I.V.:2400] Out: 2600 [Urine:2600] Intake/Output this shift: Total I/O In: -  Out: 120 [Urine:120]  General: No acute distress.  Seems dep[resse3d.  Lungs: Clear  Abd: Benign  Extremities: No changes  Neuro: Right sided paresis  Lab Results: CBC   Recent Labs  06/19/15 0516 06/20/15 0223  WBC 9.6 7.7  HGB 12.3* 12.1*  HCT 37.0* 36.7*  PLT 146* 164   BMET  Recent Labs  06/19/15 0516 06/20/15 0223  NA 139 135  K 4.0 4.0  CL 103 102  CO2 24 25  GLUCOSE 83 92  BUN 5* <5*  CREATININE 0.81 0.71  CALCIUM 8.5* 8.6*   PT/INR No results for input(s): LABPROT, INR in the last 72 hours. ABG  Recent Labs  06/19/15 0359 06/20/15 0311  PHART 7.333* 7.349*  HCO3 24.5* 23.1    Studies/Results: Dg Chest Port 1 View  06/20/2015   CLINICAL DATA:  Shortness of Breath  EXAM: PORTABLE CHEST 1 VIEW  COMPARISON:  April 19, 2015  FINDINGS: Nasogastric tube and endotracheal tube have been removed. No pneumothorax. There is patchy infiltrate in the left base, stable. The right lung is now clear. Heart is upper normal in size with pulmonary vascularity within normal limits. No adenopathy.  IMPRESSION: No pneumothorax. Patchy infiltrate left base, stable. Elsewhere lungs clear. No new opacity. No change in cardiac silhouette.   Electronically Signed   By: Bretta Bang III M.D.   On: 06/20/2015 07:23     Anti-infectives: Anti-infectives    Start     Dose/Rate Route Frequency Ordered Stop   06/17/15 2200  cephALEXin (KEFLEX) 250 MG/5ML suspension 500 mg  Status:  Discontinued     500 mg Per Tube 3 times per day 06/17/15 2136 06/19/15 1921   06/17/15 0600  cephALEXin (KEFLEX) capsule 500 mg  Status:  Discontinued     500 mg Oral 3 times per day 06/17/15 0145 06/17/15 2136      Assessment/Plan: s/p  Better pain control.  Transfer to SDU  LOS: 5 days   Marta Lamas. Gae Bon, MD, FACS [phone] Trauma Surgeon 06/21/2015
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
## 2015-06-21 NOTE — Progress Notes (Signed)
Inpatient Rehabilitation   I met with the patient, his wife Benjie Karvonen and his dad Grayland Ormond at the bedside to discuss post acute rehab needs.  I provided information on IP Rehab program and answered all their questions.  They have requested that I pursue insurance authorization for IP Rehab and this process was initiated earlier this afternoon.  I will follow up tomorrow for pt's medical readiness and will await insurance decision.  Please call if questions.  [facility] Admissions Coordinator Cell [phone] Office [phone]
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
## 2015-06-22 DIAGNOSIS — S0591XA Unspecified injury of right eye and orbit, initial encounter: Secondary | ICD-10-CM | POA: Diagnosis present

## 2015-06-22 DIAGNOSIS — W19XXXA Unspecified fall, initial encounter: Secondary | ICD-10-CM | POA: Diagnosis present

## 2015-06-22 MED ORDER — TRAMADOL HCL 50 MG PO TABS
100.0000 mg | ORAL_TABLET | Freq: Four times a day (QID) | ORAL | Status: DC
  Administered 2015-06-22 – 2015-06-23 (×5): 100 mg via ORAL
  Filled 2015-06-22 (×6): qty 2

## 2015-06-22 MED ORDER — OXYCODONE HCL 5 MG PO TABS
5.0000 mg | ORAL_TABLET | ORAL | Status: DC | PRN
  Administered 2015-06-22 (×3): 15 mg via ORAL
  Administered 2015-06-23 (×2): 10 mg via ORAL
  Administered 2015-06-23: 15 mg via ORAL
  Filled 2015-06-22: qty 2
  Filled 2015-06-22 (×2): qty 3
  Filled 2015-06-22: qty 2
  Filled 2015-06-22 (×2): qty 3

## 2015-06-22 MED ORDER — BETHANECHOL CHLORIDE 25 MG PO TABS
25.0000 mg | ORAL_TABLET | Freq: Three times a day (TID) | ORAL | Status: DC
  Administered 2015-06-22 – 2015-06-23 (×4): 25 mg via ORAL
  Filled 2015-06-22 (×4): qty 1

## 2015-06-22 MED ORDER — DOCUSATE SODIUM 100 MG PO CAPS
100.0000 mg | ORAL_CAPSULE | Freq: Two times a day (BID) | ORAL | Status: DC
  Administered 2015-06-22 – 2015-06-23 (×2): 100 mg via ORAL
  Filled 2015-06-22 (×2): qty 1

## 2015-06-22 NOTE — Progress Notes (Signed)
Pt IV removed during repositioning. Site assessed no bleeding. IV catheter still intact. Dry dressing applied. Attempted to restart but was unsuccessful. IV team consult order

## 2015-06-22 NOTE — Progress Notes (Signed)
Subjective: Patient reports that he is improving. Less headache. Still no movement on the right side.  Objective: Vital signs in last 24 hours: Temp:  [98.4 F (36.9 C)-99.2 F (37.3 C)] 98.8 F (37.1 C) (10/12 1422) Pulse Rate:  [68-82] 73 (10/12 1422) Resp:  [16-20] 16 (10/12 1422) BP: (114-138)/(45-91) 114/45 mmHg (10/12 1422) SpO2:  [94 %-98 %] 97 % (10/12 1422)  Intake/Output from previous day: 10/11 0730 - 10/12 0729 In: 900 [I.V.:900] Out: 1945 [Urine:1945] Intake/Output this shift:    He remains right hemiplegic and his left eye is swollen shut with right facial droop. He does have some movement in the right lower extremity.  Lab Results: Lab Results  Component Value Date   WBC 7.7 06/20/2015   HGB 12.1* 06/20/2015   HCT 36.7* 06/20/2015   MCV 84.0 06/20/2015   PLT 164 06/20/2015   Lab Results  Component Value Date   INR 1.07 06/16/2015   BMET Lab Results  Component Value Date   NA 135 06/20/2015   K 4.0 06/20/2015   CL 102 06/20/2015   CO2 25 06/20/2015   GLUCOSE 92 06/20/2015   BUN <5* 06/20/2015   CREATININE 0.71 06/20/2015   CALCIUM 8.6* 06/20/2015    Studies/Results: No results found.  Assessment/Plan: Overall stable. Awaiting CIR   LOS: 6 days    Dakota [name] S 06/22/2015, 3:25 PM
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
## 2015-06-22 NOTE — Progress Notes (Signed)
Therapy put pt in the chair this am attempted to ambulate from the chair to the bed but pt too weak. Pt stated that he was unable to move right upper and lower extremities due to weakness. Pt stated that he felt sensation the same as other but not able to move. Pt assisted x2 back to bed to rest. Call bell within reach. Family at bedside. Will continue to monitor.
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
## 2015-06-22 NOTE — Progress Notes (Signed)
[facility] to [facility] today.  Will update the team when I have their decision.  Per pt's dad, pt had morphine a short while ago and is sleeping heavily at this time.    Weldon PickingSusan Dajha Urquilla PT Inpatient Rehab Admissions Coordinator Cell [phone] Office [phone]
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
## 2015-06-23 ENCOUNTER — Inpatient Hospital Stay (HOSPITAL_COMMUNITY)
Admission: RE | Admit: 2015-06-23 | Discharge: 2015-07-14 | DRG: 949 | Disposition: A | Discharge status: Home / Self Care | Payer: 59 | Source: Intra-hospital | Attending: Physical Medicine & Rehabilitation | Admitting: Physical Medicine & Rehabilitation

## 2015-06-23 ENCOUNTER — Inpatient Hospital Stay (HOSPITAL_COMMUNITY): Payer: 59

## 2015-06-23 ENCOUNTER — Inpatient Hospital Stay (HOSPITAL_COMMUNITY): Payer: 59 | Admitting: Speech Pathology

## 2015-06-23 DIAGNOSIS — S0591XS Unspecified injury of right eye and orbit, sequela: Secondary | ICD-10-CM | POA: Diagnosis not present

## 2015-06-23 DIAGNOSIS — S0592XS Unspecified injury of left eye and orbit, sequela: Secondary | ICD-10-CM | POA: Diagnosis not present

## 2015-06-23 DIAGNOSIS — R252 Cramp and spasm: Secondary | ICD-10-CM | POA: Diagnosis present

## 2015-06-23 DIAGNOSIS — G8191 Hemiplegia, unspecified affecting right dominant side: Secondary | ICD-10-CM | POA: Diagnosis present

## 2015-06-23 DIAGNOSIS — S066X4A Traumatic subarachnoid hemorrhage with loss of consciousness of 6 hours to 24 hours, initial encounter: Secondary | ICD-10-CM

## 2015-06-23 DIAGNOSIS — S069X4A Unspecified intracranial injury with loss of consciousness of 6 hours to 24 hours, initial encounter: Secondary | ICD-10-CM | POA: Diagnosis not present

## 2015-06-23 DIAGNOSIS — R1310 Dysphagia, unspecified: Secondary | ICD-10-CM | POA: Diagnosis present

## 2015-06-23 DIAGNOSIS — R52 Pain, unspecified: Secondary | ICD-10-CM

## 2015-06-23 DIAGNOSIS — K5900 Constipation, unspecified: Secondary | ICD-10-CM | POA: Diagnosis present

## 2015-06-23 DIAGNOSIS — R471 Dysarthria and anarthria: Secondary | ICD-10-CM | POA: Diagnosis present

## 2015-06-23 DIAGNOSIS — R258 Other abnormal involuntary movements: Secondary | ICD-10-CM | POA: Diagnosis not present

## 2015-06-23 DIAGNOSIS — S066X4S Traumatic subarachnoid hemorrhage with loss of consciousness of 6 hours to 24 hours, sequela: Secondary | ICD-10-CM | POA: Diagnosis not present

## 2015-06-23 DIAGNOSIS — S066X9D Traumatic subarachnoid hemorrhage with loss of consciousness of unspecified duration, subsequent encounter: Secondary | ICD-10-CM | POA: Diagnosis present

## 2015-06-23 DIAGNOSIS — E871 Hypo-osmolality and hyponatremia: Secondary | ICD-10-CM | POA: Diagnosis present

## 2015-06-23 DIAGNOSIS — R339 Retention of urine, unspecified: Secondary | ICD-10-CM | POA: Diagnosis not present

## 2015-06-23 DIAGNOSIS — S0591XA Unspecified injury of right eye and orbit, initial encounter: Secondary | ICD-10-CM | POA: Diagnosis present

## 2015-06-23 DIAGNOSIS — S058X2D Other injuries of left eye and orbit, subsequent encounter: Secondary | ICD-10-CM

## 2015-06-23 DIAGNOSIS — Z87820 Personal history of traumatic brain injury: Secondary | ICD-10-CM | POA: Diagnosis present

## 2015-06-23 DIAGNOSIS — G8111 Spastic hemiplegia affecting right dominant side: Secondary | ICD-10-CM

## 2015-06-23 DIAGNOSIS — Z9889 Other specified postprocedural states: Secondary | ICD-10-CM | POA: Diagnosis not present

## 2015-06-23 DIAGNOSIS — J9600 Acute respiratory failure, unspecified whether with hypoxia or hypercapnia: Secondary | ICD-10-CM | POA: Diagnosis present

## 2015-06-23 DIAGNOSIS — K5901 Slow transit constipation: Secondary | ICD-10-CM | POA: Diagnosis not present

## 2015-06-23 DIAGNOSIS — R140 Abdominal distension (gaseous): Secondary | ICD-10-CM

## 2015-06-23 DIAGNOSIS — S069X4S Unspecified intracranial injury with loss of consciousness of 6 hours to 24 hours, sequela: Secondary | ICD-10-CM | POA: Diagnosis not present

## 2015-06-23 MED ORDER — ENSURE ENLIVE PO LIQD
237.0000 mL | Freq: Three times a day (TID) | ORAL | Status: DC
  Administered 2015-06-24 – 2015-07-04 (×18): 237 mL via ORAL

## 2015-06-23 MED ORDER — POLYETHYLENE GLYCOL 3350 17 G PO PACK
17.0000 g | PACK | Freq: Two times a day (BID) | ORAL | Status: DC
  Administered 2015-06-23 – 2015-07-07 (×23): 17 g via ORAL
  Filled 2015-06-23 (×32): qty 1

## 2015-06-23 MED ORDER — ACETAMINOPHEN 160 MG/5ML PO SOLN
650.0000 mg | Freq: Four times a day (QID) | ORAL | Status: DC | PRN
  Administered 2015-07-11: 650 mg via ORAL
  Filled 2015-06-23 (×2): qty 20.3

## 2015-06-23 MED ORDER — DOCUSATE SODIUM 100 MG PO CAPS: 200.0000 mg | ORAL_CAPSULE | Freq: Two times a day (BID) | ORAL | Status: DC

## 2015-06-23 MED ORDER — BETHANECHOL CHLORIDE 25 MG PO TABS
25.0000 mg | ORAL_TABLET | Freq: Four times a day (QID) | ORAL | Status: DC
  Administered 2015-06-23 – 2015-07-02 (×38): 25 mg via ORAL
  Filled 2015-06-23 (×38): qty 1

## 2015-06-23 MED ORDER — OXYCODONE HCL 5 MG PO TABS
10.0000 mg | ORAL_TABLET | ORAL | Status: DC | PRN
  Administered 2015-06-23 – 2015-06-26 (×6): 10 mg via ORAL
  Filled 2015-06-23 (×6): qty 2

## 2015-06-23 MED ORDER — BISACODYL 10 MG RE SUPP
10.0000 mg | Freq: Every day | RECTAL | Status: DC | PRN
  Administered 2015-06-26: 10 mg via RECTAL
  Filled 2015-06-23: qty 1

## 2015-06-23 MED ORDER — METHOCARBAMOL 500 MG PO TABS
500.0000 mg | ORAL_TABLET | Freq: Four times a day (QID) | ORAL | Status: DC | PRN
  Filled 2015-06-23: qty 1

## 2015-06-23 MED ORDER — BETHANECHOL CHLORIDE 25 MG PO TABS
25.0000 mg | ORAL_TABLET | Freq: Four times a day (QID) | ORAL | Status: DC
  Administered 2015-06-23: 25 mg via ORAL
  Filled 2015-06-23: qty 1

## 2015-06-23 MED ORDER — ERYTHROMYCIN 5 MG/GM OP OINT
TOPICAL_OINTMENT | Freq: Three times a day (TID) | OPHTHALMIC | Status: DC
  Administered 2015-06-23 – 2015-06-25 (×5): via OPHTHALMIC
  Administered 2015-06-25: 1 via OPHTHALMIC
  Administered 2015-06-25 – 2015-07-03 (×22): via OPHTHALMIC
  Administered 2015-07-03: 1 via OPHTHALMIC
  Administered 2015-07-04 – 2015-07-14 (×26): via OPHTHALMIC
  Filled 2015-06-23 (×2): qty 3.5

## 2015-06-23 MED ORDER — ONDANSETRON HCL 4 MG PO TABS: 4.0000 mg | ORAL_TABLET | Freq: Four times a day (QID) | ORAL | Status: DC | PRN

## 2015-06-23 MED ORDER — FLEET ENEMA 7-19 GM/118ML RE ENEM: 1.0000 | ENEMA | Freq: Once | RECTAL | Status: DC | PRN

## 2015-06-23 MED ORDER — ALUM & MAG HYDROXIDE-SIMETH 200-200-20 MG/5ML PO SUSP
30.0000 mL | ORAL | Status: DC | PRN
  Administered 2015-06-29: 30 mL via ORAL
  Filled 2015-06-23: qty 30

## 2015-06-23 MED ORDER — TRAZODONE HCL 50 MG PO TABS: 25.0000 mg | ORAL_TABLET | Freq: Every evening | ORAL | Status: DC | PRN

## 2015-06-23 MED ORDER — FLEET ENEMA 7-19 GM/118ML RE ENEM
1.0000 | ENEMA | Freq: Once | RECTAL | Status: AC
  Administered 2015-06-23: 1 via RECTAL
  Filled 2015-06-23: qty 1

## 2015-06-23 MED ORDER — TRAMADOL HCL 50 MG PO TABS
100.0000 mg | ORAL_TABLET | Freq: Four times a day (QID) | ORAL | Status: DC
  Administered 2015-06-23 – 2015-07-07 (×44): 100 mg via ORAL
  Filled 2015-06-23 (×48): qty 2

## 2015-06-23 MED ORDER — ONDANSETRON HCL 4 MG/2ML IJ SOLN: 4.0000 mg | Freq: Four times a day (QID) | INTRAMUSCULAR | Status: DC | PRN

## 2015-06-23 MED ORDER — TAMSULOSIN HCL 0.4 MG PO CAPS
0.8000 mg | ORAL_CAPSULE | Freq: Every day | ORAL | Status: DC
Start: 2015-06-24 — End: 2015-07-07
  Administered 2015-06-24 – 2015-07-07 (×14): 0.8 mg via ORAL
  Filled 2015-06-23 (×14): qty 2

## 2015-06-23 MED ORDER — GUAIFENESIN-DM 100-10 MG/5ML PO SYRP
5.0000 mL | ORAL_SOLUTION | Freq: Four times a day (QID) | ORAL | Status: DC | PRN
Start: 2015-06-23 — End: 2015-07-14

## 2015-06-23 MED ORDER — POLYETHYLENE GLYCOL 3350 17 G PO PACK
17.0000 g | PACK | Freq: Every day | ORAL | Status: DC
  Administered 2015-06-23: 17 g via ORAL
  Filled 2015-06-23 (×2): qty 1

## 2015-06-23 NOTE — Progress Notes (Signed)
Inpatient Rehabilitation   [facility] Re[facility].  I have spoken with Charma IgoMichael Jeffery, trauma PA who gives medical clearance.  I have updated Courtney Robarge, CM and Dysheka Bibbs, SW as well as pt's Charity fundraiserN.  I will make necessary arrangements to admit today.  Please call if questions.  Weldon PickingSusan Burnette Sautter PT Inpatient Rehab Admissions Coordinator Cell [phone] Office [phone]
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
## 2015-06-24 ENCOUNTER — Inpatient Hospital Stay (HOSPITAL_COMMUNITY): Payer: 59 | Admitting: Occupational Therapy

## 2015-06-24 ENCOUNTER — Inpatient Hospital Stay (HOSPITAL_COMMUNITY): Payer: 59 | Admitting: Physical Therapy

## 2015-06-24 ENCOUNTER — Inpatient Hospital Stay (HOSPITAL_COMMUNITY): Payer: 59

## 2015-06-24 ENCOUNTER — Inpatient Hospital Stay (HOSPITAL_COMMUNITY): Payer: 59 | Admitting: Speech Pathology

## 2015-06-24 DIAGNOSIS — S0592XS Unspecified injury of left eye and orbit, sequela: Secondary | ICD-10-CM

## 2015-06-24 DIAGNOSIS — S066X4S Traumatic subarachnoid hemorrhage with loss of consciousness of 6 hours to 24 hours, sequela: Secondary | ICD-10-CM

## 2015-06-24 DIAGNOSIS — G8191 Hemiplegia, unspecified affecting right dominant side: Secondary | ICD-10-CM

## 2015-06-24 LAB — CBC WITH DIFFERENTIAL/PLATELET
Basophils Absolute: 0 10*3/uL (ref 0.0–0.1)
Basophils Relative: 0 %
Eosinophils Absolute: 0.2 10*3/uL (ref 0.0–0.7)
Eosinophils Relative: 3 %
HEMATOCRIT: 40.9 % (ref 39.0–52.0)
Hemoglobin: 14.1 g/dL (ref 13.0–17.0)
LYMPHS ABS: 1.6 10*3/uL (ref 0.7–4.0)
LYMPHS PCT: 19 %
MCH: 28.4 pg (ref 26.0–34.0)
MCHC: 34.5 g/dL (ref 30.0–36.0)
MCV: 82.3 fL (ref 78.0–100.0)
MONO ABS: 0.7 10*3/uL (ref 0.1–1.0)
MONOS PCT: 8 %
NEUTROS ABS: 5.8 10*3/uL (ref 1.7–7.7)
Neutrophils Relative %: 70 %
Platelets: 267 10*3/uL (ref 150–400)
RBC: 4.97 MIL/uL (ref 4.22–5.81)
RDW: 13 % (ref 11.5–15.5)
WBC: 8.3 10*3/uL (ref 4.0–10.5)

## 2015-06-24 LAB — URINALYSIS, ROUTINE W REFLEX MICROSCOPIC
Bilirubin Urine: NEGATIVE
GLUCOSE, UA: NEGATIVE mg/dL
KETONES UR: NEGATIVE mg/dL
Leukocytes, UA: NEGATIVE
Nitrite: NEGATIVE
PH: 6.5 (ref 5.0–8.0)
PROTEIN: NEGATIVE mg/dL
Specific Gravity, Urine: 1.013 (ref 1.005–1.030)
Urobilinogen, UA: 1 mg/dL (ref 0.0–1.0)

## 2015-06-24 LAB — COMPREHENSIVE METABOLIC PANEL
ALBUMIN: 3.4 g/dL — AB (ref 3.5–5.0)
ALK PHOS: 59 U/L (ref 38–126)
ALT: 124 U/L — AB (ref 17–63)
AST: 57 U/L — AB (ref 15–41)
Anion gap: 10 (ref 5–15)
BILIRUBIN TOTAL: 0.5 mg/dL (ref 0.3–1.2)
BUN: 12 mg/dL (ref 6–20)
CO2: 28 mmol/L (ref 22–32)
CREATININE: 0.87 mg/dL (ref 0.61–1.24)
Calcium: 9.2 mg/dL (ref 8.9–10.3)
Chloride: 96 mmol/L — ABNORMAL LOW (ref 101–111)
GFR calc Af Amer: >60 mL/min (ref >60)
GLUCOSE: 123 mg/dL — AB (ref 65–99)
Potassium: 3.9 mmol/L (ref 3.5–5.1)
Sodium: 134 mmol/L — ABNORMAL LOW (ref 135–145)
TOTAL PROTEIN: 6.6 g/dL (ref 6.5–8.1)

## 2015-06-24 LAB — URINE MICROSCOPIC-ADD ON
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
## 2015-06-24 NOTE — Progress Notes (Signed)
Dakota [name] PHYSICAL MEDICINE & REHABILITATION     PROGRESS NOTE    Subjective/Complaints: Had a reasonable night. Does complain of pain in his left eye. Right side remains quite weak.   ROS: Pt denies fever, rash/itching, headache, blurred or double vision, nausea, vomiting, abdominal pain, diarrhea, chest pain, shortness of breath, palpitations, dysuria, dizziness, neck or back pain, bleeding, anxiety, or depression  Objective: Vital Signs: Blood pressure 116/61, pulse 84, temperature 99 F (37.2 C), temperature source Oral, resp. rate 16, SpO2 96 %. Dg Abd Portable 1v  06/23/2015  CLINICAL DATA:  Abdominal pain and distention. EXAM: PORTABLE ABDOMEN - 1 VIEW COMPARISON:  CT of the chest abdomen pelvis dated 06/16/2015 FINDINGS: The bowel gas pattern is normal. No radio-opaque calculi or other significant radiographic abnormality are seen. Osseous structures are normal. IMPRESSION: Negative. Electronically Signed   By: Ted Mcalpine M.D.   On: 06/23/2015 17:34    Recent Labs  06/24/15 0518  WBC 8.3  HGB 14.1  HCT 40.9  PLT 267    Recent Labs  06/24/15 0518  NA 134*  K 3.9  CL 96*  GLUCOSE 123*  BUN 12  CREATININE 0.87  CALCIUM 9.2   CBG (last 3)   Recent Labs  06/21/15 1157  GLUCAP 111*    Wt Readings from Last 3 Encounters:  06/20/15 91.7 kg (202 lb 2.6 oz)    Physical Exam:  Constitutional: He is oriented to person, place, and time. He appears well-developed and well-nourished. He appears lethargic. He is easily aroused.  Flat affect. Fairly alert.    HENT:  Head: Normocephalic.  Tongue with white coating.  Eyes: Right eye exhibits no discharge. Left eye exhibits discharge. Left conjunctiva is injected. Left pupil is not reactive.  Left ptosis with decrease in lid edema/ecchymosis. Right - normal in appearance  Neck: Normal range of motion. Neck supple.  Cardiovascular: Normal rate and regular rhythm.  No murmur heard. Respiratory:  Effort normal and breath sounds normal. No respiratory distress. He has no wheezes.  GI: Soft. Bowel sounds are normal. He exhibits no distension. There is no tenderness.  Musculoskeletal: He exhibits no edema or tenderness.  Neurological: He is oriented to person, place, and time and easily aroused. He is unable to lift his left eye lid. Pupil non-reactive, cannot deviate medially or vertically, perhaps mild abduction Right facial weakness with mild to moderate dysarthria and right tongue deviation. Dense right hemiparesis of the UE: 0/5 prox to distal. RLE: with extensor tone, grossly 1-2/5 HF,KE, ADF/APF but inconsistent. Sensation 1/2 right face and RUE, perhaps 1+ RLE. Lacks insight and awareness of deficits.  Motor strength is 0/5 in the right deltoid, biceps, triceps, grip Trace right hip knee extensor synergy otherwise 0/5 in the right lower extremity 5/5 on the left upper and left lower extremity    Psychiatric: His affect is blunt. His speech is delayed and slurred. He is slowed.   Assessment/Plan: 1. Functional deficits secondary to TBI with [facility] which require 3+ hours per day of interdisciplinary therapy in a comprehensive inpatient rehab setting. Physiatrist is providing close team supervision and 24 hour management of active medical problems listed below. Physiatrist and rehab team continue to assess barriers to discharge/monitor patient progress toward functional and medical goals.  Function:  Bathing Bathing position      Bathing parts      Bathing assist        Upper Body Dressing/Undressing Upper body dressing  Upper body assist        Lower Body Dressing/Undressing Lower body dressing                                  Lower body assist        Toileting Toileting     Toileting steps completed by helper: Adjust clothing prior to toileting, Performs perineal hygiene, Adjust clothing after toileting    Toileting  assist Assist level: Two helpers   Transfers Chair/bed Optician, dispensingtransfer             Locomotion Ambulation           Wheelchair          Cognition Comprehension Comprehension assist level: Understands basic 75 - 89% of the time/ requires cueing 10 - 24% of the time  Expression Expression assist level: Expresses basic 25 - 49% of the time/requires cueing 50 - 75% of the time. Uses single words/gestures.  Social Interaction Social Interaction assist level: Interacts appropriately 90% of the time - Needs monitoring or encouragement for participation or interaction.  Problem Solving Problem solving assist level: Solves basic 50 - 74% of the time/requires cueing 25 - 49% of the time  Memory Memory assist level: Recognizes or recalls 75 - 89% of the time/requires cueing 10 - 24% of the time   Medical Problem List and Plan: 1. Functional deficits secondary to TBI with SAH   -begin CIR therapies 2. DVT Prophylaxis/Anticoagulation: Mechanical: Sequential compression devices, below knee Bilateral lower extremities.  -dopplers ordered due to dense Right hemiparesis and immobility.  3. Pain Management: Will continue oxycodone prn. encouraged use as needed. Consider scheduled doses short term 4. Mood: Monitor as mentation improves. LCSW to follow with patient for evaluation and support.  5. Neuropsych: This patient is not capable of making decisions on his own behalf. 6. Skin/Wound Care: Routine pressure relief measures. Monitor L-eye for resolution of edema.  7. Fluids/Electrolytes/Nutrition: Monitor I/O. continue nutritional supplements as only ordering/eating soft fruits. Educated wife on helping to order appropriate foods.  -Sodium 134 but essentially BMET was normal today. I personally reviewed the patient's labs today.  8. Constipation:  Senna S scheduled. Had bm last night. 9. Urinary retention:  Urecholine increased to qid. Continue Flomax. pvr's ranging from 500-1000cc  -encouraged  timed attempts at voiding 10. Left retrobulbar hemorrhage with RAPD: continue observation and conservative care. LOS (Days) 1 A FACE TO FACE EVALUATION WAS PERFORMED  Rubbie Goostree T 06/24/2015 10:12 AM
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
## 2015-06-24 NOTE — Progress Notes (Signed)
Pt attempted to void on BSC with no results. Pt states he has urge. Bladder scan for 538ml. RN I&O cathed pt at 12am for 500ml. Educated pt and wife on I&O catheterization, bladder scans, and urecholine for bladder retention. Will continue to monitor. Encouraged pt to [facility] to [facility] or toilet prior to any catheterization.

## 2015-06-24 NOTE — Progress Notes (Signed)
Patient information reviewed and entered into eRehab system by Tekeyah Santiago, RN, CRRN, PPS Coordinator.  Information including medical coding and functional independence measure will be reviewed and updated through discharge.    
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
## 2015-06-24 NOTE — Progress Notes (Signed)
Occupational Therapy Note  Patient Details  Name: Dakota PartridgeJessie [name] MRN: [id number] Date of Birth: 05/10/1986  Today's Date: 06/24/2015 OT Individual Time: 1130-1200 OT Individual Time Calculation (min): 30 min   Pt denied pain Individual Therapy  Pt resting in w/c with father present.  Pt stated he was extremely tired and requested to return to bed.  Pt agreed to remain in w/c until after lunch and then return to bed to rest before afternoon session.  Pt unable to keep right eye open and actively engaged in discussion although he did respond to direct questions.  Pt's father stated that pt is not used to this extensive therapy.  Discussed role of OT, LTGs, POC, and scheduling.    Lavone NeriLanier, Zenith Kercheval [facility] 06/24/2015, 12:10 PM
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
## 2015-06-25 ENCOUNTER — Inpatient Hospital Stay (HOSPITAL_COMMUNITY): Payer: 59 | Admitting: Physical Therapy

## 2015-06-25 ENCOUNTER — Inpatient Hospital Stay (HOSPITAL_COMMUNITY): Payer: 59 | Admitting: Occupational Therapy

## 2015-06-25 ENCOUNTER — Inpatient Hospital Stay (HOSPITAL_COMMUNITY): Payer: 59 | Admitting: Speech Pathology

## 2015-06-25 LAB — URINE CULTURE: CULTURE: NO GROWTH

## 2015-06-25 NOTE — Progress Notes (Signed)
[facility] PHYSICAL MEDICINE & REHABILITATION     PROGRESS NOTE    Subjective/Complaints: Father in room , asking about PRAFO ROS: Pt denies fever, rash/itching, headache, blurred or double vision, nausea, vomiting, abdominal pain, diarrhea, chest pain, shortness of breath, palpitations, dysuria, dizziness, neck or back pain, bleeding, anxiety, or depression  Objective: Vital Signs: Blood pressure 113/62, pulse 72, temperature 99.2 F (37.3 C), temperature source Oral, resp. rate 16, SpO2 96 %. Dg Abd Portable 1v  06/23/2015  CLINICAL DATA:  Abdominal pain and distention. EXAM: PORTABLE ABDOMEN - 1 VIEW COMPARISON:  CT of the chest abdomen pelvis dated 06/16/2015 FINDINGS: The bowel gas pattern is normal. No radio-opaque calculi or other significant radiographic abnormality are seen. Osseous structures are normal. IMPRESSION: Negative. Electronically Signed   By: Ted Mcalpine M.D.   On: 06/23/2015 17:34    Recent Labs  06/24/15 0518  WBC 8.3  HGB 14.1  HCT 40.9  PLT 267    Recent Labs  06/24/15 0518  NA 134*  K 3.9  CL 96*  GLUCOSE 123*  BUN 12  CREATININE 0.87  CALCIUM 9.2   CBG (last 3)  No results for input(s): GLUCAP in the last 72 hours.  Wt Readings from Last 3 Encounters:  06/20/15 91.7 kg (202 lb 2.6 oz)    Physical Exam:  Constitutional: He is oriented to person, place, and time. He appears well-developed and well-nourished. He appears lethargic. He is easily aroused.  Flat affect. Fairly alert.    HENT:  Head: Normocephalic.  Tongue with white coating.  Eyes: Right eye exhibits no discharge. Left eye exhibits discharge. Left conjunctiva is injected. Left pupil is not reactive.  Left ptosis with decrease in lid edema/ecchymosis. Right - normal in appearance  Neck: Normal range of motion. Neck supple.  Cardiovascular: Normal rate and regular rhythm.  No murmur heard. Respiratory: Effort normal and breath sounds normal. No respiratory  distress. He has no wheezes.  GI: Soft. Bowel sounds are normal. He exhibits no distension. There is no tenderness.  Musculoskeletal: He exhibits no edema or tenderness.  Neurological: He is oriented to person, place, and time and easily aroused. He is unable to lift his left eye lid. Pupil non-reactive, cannot deviate medially or vertically, perhaps mild abduction Right facial weakness with mild to moderate dysarthria and right tongue deviation. Dense right hemiparesis of the UE: 0/5 prox to distal. RLE: with extensor tone, grossly 1-2/5 HF,KE, ADF/APF but inconsistent. Sensation 1/2 right face and RUE, perhaps 1+ RLE. Lacks insight and awareness of deficits.  Motor strength is 0/5 in the right deltoid, biceps, triceps, grip Trace right hip knee extensor synergy otherwise 0/5 in the right lower extremity 5/5 on the left upper and left lower extremity    Psychiatric: His affect is blunt. His speech is delayed and slurred. He is slowed.   Assessment/Plan: 1. Functional deficits secondary to TBI with [facility] which require 3+ hours per day of interdisciplinary therapy in a comprehensive inpatient rehab setting. Physiatrist is providing close team supervision and 24 hour management of active medical problems listed below. Physiatrist and rehab team continue to assess barriers to discharge/monitor patient progress toward functional and medical goals.  Function:  Bathing Bathing position   Position: Sitting EOB  Bathing parts Body parts bathed by patient: Right arm, Chest, Abdomen, Front perineal area, Right upper leg, Left upper leg, Right lower leg, Left lower leg Body parts bathed by helper: Left arm, Buttocks, Back  Bathing assist  Upper Body Dressing/Undressing Upper body dressing   What is the patient wearing?: Pull over shirt/dress     Pull over shirt/dress - Perfomed by patient: Thread/unthread left sleeve, Put head through opening, Pull shirt over trunk Pull over  shirt/dress - Perfomed by helper: Thread/unthread right sleeve        Upper body assist        Lower Body Dressing/Undressing Lower body dressing   What is the patient wearing?: Underwear, Pants, Socks, Shoes Underwear - Performed by patient: Thread/unthread left underwear leg, Pull underwear up/down Underwear - Performed by helper: Thread/unthread right underwear leg Pants- Performed by patient: Thread/unthread left pants leg, Pull pants up/down Pants- Performed by helper: Thread/unthread right pants leg       Socks - Performed by helper: Don/doff right sock, Don/doff left sock   Shoes - Performed by helper: Don/doff right shoe, Don/doff left shoe, Fasten right, Fasten left          Lower body assist        Toileting Toileting     Toileting steps completed by helper: Adjust clothing prior to toileting, Performs perineal hygiene, Adjust clothing after toileting    Toileting assist Assist level: Two helpers   Transfers Chair/bed transfer   Chair/bed transfer method: Squat pivot Chair/bed transfer assist level: Moderate assist (Pt 50 - 74%/lift or lower) Chair/bed transfer assistive device: Armrests     Locomotion Ambulation     Max distance: 80 ft Assist level: 2 helpers   Wheelchair   Type: Manual Max wheelchair distance: 165 ft Assist Level: Supervision or verbal cues  Cognition Comprehension Comprehension assist level: Understands basic 75 - 89% of the time/ requires cueing 10 - 24% of the time  Expression Expression assist level: Expresses basic 50 - 74% of the time/requires cueing 25 - 49% of the time. Needs to repeat parts of sentences.  Social Interaction Social Interaction assist level: Interacts appropriately 75 - 89% of the time - Needs redirection for appropriate language or to initiate interaction.  Problem Solving Problem solving assist level: Solves basic 50 - 74% of the time/requires cueing 25 - 49% of the time  Memory Memory assist level:  Recognizes or recalls 50 - 74% of the time/requires cueing 25 - 49% of the time   Medical Problem List and Plan: 1. 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 RIght hemiparesis dense in UE , emerging synergy RLE  -begin CIR therapies 2. DVT Prophylaxis/Anticoagulation: Mechanical: Sequential compression devices, below knee Bilateral lower extremities.  -dopplers ordered due to dense Right hemiparesis and immobility.  3. Pain Management: Will continue oxycodone prn. encouraged use as needed. Consider scheduled doses short term 4. Mood: Monitor as mentation improves. LCSW to follow with patient for evaluation and support.  5. Neuropsych: This patient is not capable of making decisions on his own behalf. 6. Skin/Wound Care: Routine pressure relief measures. Monitor L-eye for resolution of edema.  7. Fluids/Electrolytes/Nutrition: Monitor I/O. continue nutritional supplements   -Sodium 134  I personally reviewed the patient's labs today.  8. Constipation:  Senna S scheduled.  9. Urinary retention:  Urecholine increased to qid. Continue Flomax. pvr's ranging from 500-1000cc  -encouraged timed attempts at voiding 10. Left retrobulbar hemorrhage with RAPD: continue observation and conservative care.no eye pain c/os LOS (Days) 2 A FACE TO FACE EVALUATION WAS PERFORMED  Claudette LawsKIRSTEINS,ANDREW E 06/25/2015 9:17 AM

## 2015-06-25 NOTE — Progress Notes (Signed)
Orthopedic Tech Progress Note Patient Details:  Dakota PartridgeJessie [name] [birth year] [id number]  Ortho Devices Type of Ortho Device: Sling immobilizer Ortho Device/Splint Interventions: Application   Saul FordyceJennifer C [name] 06/25/2015, 11:19 AM

## 2015-06-25 NOTE — Progress Notes (Signed)
Occupational Therapy Session Note  Patient Details  Name: Pieter PartridgeJessie Valiente MRN: [id number] Date of Birth: 08-16-1986  Today's Date: 06/25/2015 OT Individual Time: 1300-1330 OT Individual Time Calculation (min): 30 min    Short Term Goals: Week 1:  OT Short Term Goal 1 (Week 1): Pt will transfer to the toilet with min A using a squat pivot transfer. OT Short Term Goal 2 (Week 1): Pt will complete toileting with steadying A. OT Short Term Goal 3 (Week 1): Pt will don shirt with set up. OT Short Term Goal 4 (Week 1): Pt will dress LB with min A. OT Short Term Goal 5 (Week 1): Pt will complete RUE self ROM ex with min cues.  Skilled Therapeutic Interventions/Progress Updates:    Pt seen for 1:1 OT session with focus on R NMR, cognitive remediation, and activity tolerance. Pt received sitting in w/c with father present. Engaged in card games while in standing with therapist strategically placing to right to facilitate weightbearing to RUE and RLE. Card games utilized to challenge problem solving skills. Played game of war with pt demonstrating 100% accuracy. Completed black-jack with pt dealing cards. He required 2 cues for correct sequence of distributing cards and 1 cue for correct addition. Therapist engaged in therapeutic conversation during task to address alternating attention. Pt demonstrated alternating attention with minimal cues. Returned to room and left with all needs in reach and father present.   Therapy Documentation Precautions:  Precautions Precautions: Fall Precaution Comments: R sided weakness Restrictions Weight Bearing Restrictions: No General:   Vital Signs:   Pain: Pain Assessment Pain Assessment: 0-10 Pain Score: 5  Pain Type: Acute pain Pain Location: Eye Pain Orientation: Left Pain Descriptors / Indicators: Aching Pain Onset: Gradual Pain Intervention(s): Medication (See eMAR)  See Function Navigator for Current Functional Status.   Therapy/Group:  Individual Therapy  Mersadie Kavanaugh N 06/25/2015, 1:40 PM

## 2015-06-25 NOTE — Progress Notes (Signed)
Speech Language Pathology Daily Session Note  Patient Details  Name: Dakota [name] MRN: [id number] Date of Birth: 03-31-1986  Today's Date: 06/25/2015 SLP Individual Time: [id number] SLP Individual Time Calculation (min): 45 min  Short Term Goals: Week 1: SLP Short Term Goal 1 (Week 1): Pt will consume presentations of his currently prescribed diet with supervision cues for use of swallowing precautions.  SLP Short Term Goal 2 (Week 1): Pt will consume trials of advanced consistencies with minimal overt s/s of aspiration and supervision cues for use of swallowing precautions over 2 targeted sessions prior to advancement.  SLP Short Term Goal 3 (Week 1): Pt will utilize memory compensatory strategies to facilitate recall of daily information wtih min assist verbal cues.   SLP Short Term Goal 4 (Week 1): Pt will complete basic to semi-complex self care and/or home management tasks with min assist verbal cues for functional problem solving.   SLP Short Term Goal 5 (Week 1): Pt will utilize increased vocal intensity and overarticulation to achieve intelligibility in conversation with supervision.   SLP Short Term Goal 6 (Week 1): Pt will recognize and correct errors in the moment during functional tasks with min assist verbal cues.    Skilled Therapeutic Interventions: Skilled ST intervention provided with focus on dysphagia and cognitive goals. Pt required moderate verbal encouragement to participate in tx tasks, along with 2 redirections. Pt consumed trial of D4/thin liquids to assess potential for diet upgrade. Prolonged mastication noted with regular solids and oral stasis cleared with liquid wash. No overt s/s of aspiration with small sips of thin liquids via straw trial. Pt completed complex PS tasks with 100% accuracy, no assist. Pt completed mediation management tasks with use of pill box organizer with 100% accuracy, no assist. Pt and father report changes in sound of pt's voice. Father states,  "it sounds weaker".    Function:  Eating Eating   Modified Consistency Diet: Yes Eating Assist Level: Supervision or verbal cues           Cognition Comprehension Comprehension assist level: Understands basic 90% of the time/cues < 10% of the time  Expression   Expression assist level: Expresses basic 75 - 89% of the time/requires cueing 10 - 24% of the time. Needs helper to occlude trach/needs to repeat words.  Social Interaction Social Interaction assist level: Interacts appropriately 50 - 74% of the time - May be physically or verbally inappropriate.  Problem Solving Problem solving assist level: Solves complex 90% of the time/cues < 10% of the time  Memory Memory assist level: Recognizes or recalls 50 - 74% of the time/requires cueing 25 - 49% of the time    Pain    Therapy/Group: Individual Therapy  Winola Drum, Kara PacerLeah N 06/25/2015, 11:29 AM

## 2015-06-25 NOTE — Progress Notes (Signed)
Orthopedic Tech Progress Note Patient Details:  Dakota PartridgeJessie [name] [birth year] [id number]  Patient ID: Dakota [name], male   DOB: [birth year], 29 y.o.   MRN: [id number]   Saul FordyceJennifer C Erisa Mehlman 06/25/2015, 11:26 AMCalled [employer] for right Prafo brace.

## 2015-06-25 NOTE — Progress Notes (Signed)
Physical Therapy Session Note  Patient Details  Name: Dakota PartridgeJessie [name] MRN: [id number] Date of Birth: [birth year]  Today's Date: 06/25/2015 PT Individual Time: 1400-1500 PT Individual Time Calculation (min): 60 min   Short Term Goals: Week 1:  PT Short Term Goal 1 (Week 1): Patient will ambulate x 100 ft using LRAD with assist of one person.  PT Short Term Goal 2 (Week 1): Patient will perform bed <> wheelchair transfers with consistent min A.  PT Short Term Goal 3 (Week 1): Patient will perform bed mobility with consistent min A and min cues for sequencing and technique.  PT Short Term Goal 4 (Week 1): Patient will negotiate up/down 12 stairs using 1 rail with mod A x 1.  PT Short Term Goal 5 (Week 1): Patient will maintain dynamic standing balance x 5 min with mod A overall.   Skilled Therapeutic Interventions/Progress Updates:   Pt received sitting in w/c in room, agreeable to therapy session, father present to observe and follow with chair as needed.  Skilled session focused on gait training with trials of varying AD and addition of blue rocker AFO for DF assist and increased R knee stabilization in stance.  Performed 1830' with use of hemi walker and no AFO at mod A level.  Pt with good hip and knee initiation, however note marked toe drag and require physical assist to clear RLE.  Added blue rocker AFO and progressed to [facility] x another 50' at mod to min A with marked improvement noted in ability to clear RLE as well as good stabilization of R knee during stance (provided intermittent tapping to quad for increased mm activation).  Cues for placement of cane as well as increased L step length.  Ended gait with progression to [facility] x 80' at min A with AFO as above.  Great ability to progress to two point gait pattern and step through pattern.  Cues for upright posture and min facilitation at hips for adequate weight shift.  Performed 4, 6" steps with use of L handrail at mod A level.  Pt able to bring  RLE to step, however provided facilitation for increased knee flex.  Max cues for sequencing.  Ended session with tall kneeling task with kay bench in front of pt.  Performing reaching task to the R with PT providing WB and external rotation through RUE as well as cues for increased R lateral weight shift.  Progressed to tall kneeling squats x 10 reps with cues for keeping "hip to my hip" for increased R weight shift.  Tolerated well.  Assisted back to room and left in w/c.  Educated pt and father on pts progress and OP therapy following hospital stay.    Therapy Documentation Precautions:  Precautions Precautions: Fall Precaution Comments: R sided weakness Restrictions Weight Bearing Restrictions: No   Vital Signs: Therapy Vitals Temp: 98.9 F (37.2 C) Temp Source: Oral Pulse Rate: 72 Resp: 18 BP: 122/76 mmHg Patient Position (if appropriate): Sitting Oxygen Therapy SpO2: 95 % Pain: Pain Assessment Pain Assessment: 0-10 Pain Score: 2  Pain Type: Acute pain Pain Location: Eye Pain Orientation: Left Pain Descriptors / Indicators: Aching Pain Onset: Gradual Pain Intervention(s): Medication (See eMAR)   See Function Navigator for Current Functional Status.   Therapy/Group: Individual Therapy  Vista Deckarcell, Jaeson Molstad Ann 06/25/2015, 3:55 PM

## 2015-06-25 NOTE — Progress Notes (Addendum)
Occupational Therapy Session Note  Patient Details  Name: Dakota PartridgeJessie [name] MRN: [id number] Date of Birth: 1985-10-12  Today's Date: 06/25/2015 OT Individual Time:  -    1100-1210   (70 min)   Short Term Goals: Week 1:  OT Short Term Goal 1 (Week 1): Pt will transfer to the toilet with min A using a squat pivot transfer. OT Short Term Goal 2 (Week 1): Pt will complete toileting with steadying A. OT Short Term Goal 3 (Week 1): Pt will don shirt with set up. OT Short Term Goal 4 (Week 1): Pt will dress LB with min A. OT Short Term Goal 5 (Week 1): Pt will complete RUE self ROM ex with min cues.  Skilled Therapeutic Interventions/Progress Updates:    Addressed the following deficits:  acute pain, blindness and low vision, cognitive deficits and hemiplegia affecting dominant side:  Pt lying in bed.  Supine>sit>wc>tub bench>wc with mod assist.  Dad left after OT started to give son privacy.  Sit to stand with min assist while in shower; did lateral leans for posterior pelvic area. Pt needed cues for impulsive movement through out session.  He rolled wc at end of session to clean up dirty linens with no cues and min guarding for reaching to floor.    Therapy Documentation Precautions:  Precautions Precautions: Fall Precaution Comments: R sided weakness Restrictions Weight Bearing Restrictions: No      Pain:  5/10  Right eye          See Function Navigator for Current Functional Status.   Therapy/Group: Individual Therapy  Humberto Sealsdwards, Shanyn Preisler J 06/25/2015, 8:00 AM

## 2015-06-26 ENCOUNTER — Inpatient Hospital Stay (HOSPITAL_COMMUNITY): Payer: 59

## 2015-06-26 ENCOUNTER — Inpatient Hospital Stay (HOSPITAL_COMMUNITY): Payer: 59 | Admitting: Physical Therapy

## 2015-06-26 DIAGNOSIS — Z9889 Other specified postprocedural states: Secondary | ICD-10-CM

## 2015-06-26 DIAGNOSIS — K5901 Slow transit constipation: Secondary | ICD-10-CM

## 2015-06-26 NOTE — Progress Notes (Signed)
[facility] PHYSICAL MEDICINE & REHABILITATION     PROGRESS NOTE    Subjective/Complaints: Discussed increased tone in Right finger flexors, received PRAFO yesterday Discussed bladder issues with RN ROS: Pt denies fever, rash/itching, headache, blurred or double vision, nausea, vomiting, abdominal pain, diarrhea, + constipation  Objective: Vital Signs: Blood pressure 111/58, pulse 69, temperature 97.7 F (36.5 C), temperature source Oral, resp. rate 18, SpO2 93 %. No results found.  Recent Labs  06/24/15 0518  WBC 8.3  HGB 14.1  HCT 40.9  PLT 267    Recent Labs  06/24/15 0518  NA 134*  K 3.9  CL 96*  GLUCOSE 123*  BUN 12  CREATININE 0.87  CALCIUM 9.2   CBG (last 3)  No results for input(s): GLUCAP in the last 72 hours.  Wt Readings from Last 3 Encounters:  06/20/15 91.7 kg (202 lb 2.6 oz)    Physical Exam:  Constitutional: He is oriented to person, place, and time. He appears well-developed and well-nourished. He appears lethargic. He is easily aroused.  Flat affect. Fairly alert.    HENT:  Head: Normocephalic.  Tongue with white coating.  Eyes: Right eye exhibits no discharge. Left eye exhibits discharge. Left conjunctiva is injected. Left pupil is not reactive.  Left ptosis with decrease in lid edema/ecchymosis. Right - normal in appearance  Neck: Normal range of motion. Neck supple.  Cardiovascular: Normal rate and regular rhythm.  No murmur heard. Respiratory: Effort normal and breath sounds normal. No respiratory distress. He has no wheezes.  GI: Soft. Bowel sounds are normal. He exhibits no distension. There is no tenderness. No rebound Musculoskeletal: He exhibits no edema or tenderness.  Neurological: He is oriented to person, place, and time and easily aroused. He is unable to lift his left eye lid. Pupil non-reactive, cannot deviate medially or vertically, perhaps mild abduction Right facial weakness with mild to moderate dysarthria and  right tongue deviation. Dense right hemiparesis of the UE: 0/5 prox to distal. RLE: with extensor tone, grossly 1-2/5 HF,KE, ADF/APF but inconsistent. Sensation 1/2 right face and RUE, perhaps 1+ RLE. Lacks insight and awareness of deficits.  Motor strength is 0/5 in the right deltoid, biceps, triceps, grip Trace right hip knee extensor synergy otherwise 0/5 in the right lower extremity 5/5 on the left upper and left lower extremity    Psychiatric: His affect is blunt. His speech is delayed and slurred. He is slowed.   Assessment/Plan: 1. Functional deficits secondary to TBI with [facility] which require 3+ hours per day of interdisciplinary therapy in a comprehensive inpatient rehab setting. Physiatrist is providing close team supervision and 24 hour management of active medical problems listed below. Physiatrist and rehab team continue to assess barriers to discharge/monitor patient progress toward functional and medical goals.  Function:  Bathing Bathing position   Position: Shower  Bathing parts Body parts bathed by patient: Right arm, Chest, Abdomen, Front perineal area, Right upper leg, Left upper leg, Right lower leg, Left lower leg, Buttocks Body parts bathed by helper: Left arm, Back  Bathing assist Assist Level: Touching or steadying assistance(Pt > 75%)      Upper Body Dressing/Undressing Upper body dressing   What is the patient wearing?: Pull over shirt/dress     Pull over shirt/dress - Perfomed by patient: Thread/unthread left sleeve, Put head through opening, Pull shirt over trunk Pull over shirt/dress - Perfomed by helper: Thread/unthread right sleeve        Upper body assist Assist Level: Touching or  steadying assistance(Pt > 75%)      Lower Body Dressing/Undressing Lower body dressing   What is the patient wearing?: Underwear, Pants, Socks, Shoes Underwear - Performed by patient: Thread/unthread left underwear leg, Pull underwear up/down Underwear - Performed by  helper: Thread/unthread right underwear leg Pants- Performed by patient: Thread/unthread left pants leg, Pull pants up/down Pants- Performed by helper: Thread/unthread right pants leg     Socks - Performed by patient: Don/doff left sock Socks - Performed by helper: Don/doff right sock   Shoes - Performed by helper: Don/doff right shoe, Don/doff left shoe, Fasten right, Fasten left          Lower body assist        Toileting Toileting     Toileting steps completed by helper: Adjust clothing prior to toileting, Performs perineal hygiene, Adjust clothing after toileting    Toileting assist Assist level: Two helpers   Transfers Chair/bed transfer   Chair/bed transfer method: Squat pivot Chair/bed transfer assist level: Moderate assist (Pt 50 - 74%/lift or lower) Chair/bed transfer assistive device: Armrests     Locomotion Ambulation     Max distance: 100 Assist level: Moderate assist (Pt 50 - 74%) (mod fading to min)   Wheelchair   Type: Manual Max wheelchair distance: 165 ft Assist Level: Supervision or verbal cues  Cognition Comprehension Comprehension assist level: Understands basic 90% of the time/cues < 10% of the time  Expression Expression assist level: Expresses basic 75 - 89% of the time/requires cueing 10 - 24% of the time. Needs helper to occlude trach/needs to repeat words.  Social Interaction Social Interaction assist level: Interacts appropriately 50 - 74% of the time - May be physically or verbally inappropriate.  Problem Solving Problem solving assist level: Solves complex 90% of the time/cues < 10% of the time  Memory Memory assist level: Recognizes or recalls 50 - 74% of the time/requires cueing 25 - 49% of the time   Medical Problem List and Plan: 1. 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 RIght hemiparesis dense in UE , emerging synergy RLE  -begin CIR therapies 2. DVT Prophylaxis/Anticoagulation: Mechanical: Sequential compression devices,  below knee Bilateral lower extremities.  -dopplers ordered due to dense Right hemiparesis and immobility.  3. Pain Management: Will continue oxycodone prn. encouraged use as needed. Consider scheduled doses short term 4. Mood: Monitor as mentation improves. LCSW to follow with patient for evaluation and support.  5. Neuropsych: This patient is not capable of making decisions on his own behalf. 6. Skin/Wound Care: Routine pressure relief measures. Monitor L-eye for resolution of edema.  7. Fluids/Electrolytes/Nutrition: Monitor I/O. continue nutritional supplements   -Sodium 134  I personally reviewed the patient's labs today.  8. Constipation:  Senna S scheduled. Will increase and give supp today 9. Urinary retention:  Urecholine increased to qid. Continue Flomax. pvr's normalized, still with hesitency but emptying without I/O cath   10. Left retrobulbar hemorrhage with RAPD: continue observation and conservative care.no eye pain c/os LOS (Days) 3 A FACE TO FACE EVALUATION WAS PERFORMED  KIRSTEINS,ANDREW E 06/26/2015 9:06 AM

## 2015-06-26 NOTE — Progress Notes (Signed)
Physical Therapy Session Note  Patient Details  Name: Pieter PartridgeJessie Sayavong MRN: [id number] Date of Birth: June 14, 1986  Today's Date: 06/26/2015 PT Individual Time: 1000-1100 PT Individual Time Calculation (min): 60 min   Short Term Goals: Week 1:  PT Short Term Goal 1 (Week 1): Patient will ambulate x 100 ft using LRAD with assist of one person.  PT Short Term Goal 2 (Week 1): Patient will perform bed <> wheelchair transfers with consistent min A.  PT Short Term Goal 3 (Week 1): Patient will perform bed mobility with consistent min A and min cues for sequencing and technique.  PT Short Term Goal 4 (Week 1): Patient will negotiate up/down 12 stairs using 1 rail with mod A x 1.  PT Short Term Goal 5 (Week 1): Patient will maintain dynamic standing balance x 5 min with mod A overall.   Skilled Therapeutic Interventions/Progress Updates:   Session focused on RLE/RUE NMR, functional ambulation, standing balance, and activity tolerance. Patient propelled wheelchair via L hemi technique to/from room with supervision to avoid obstacles on R. Gait using SBQC with R AFO donned x 90 ft with mod A overall, verbal/tactile cues to decrease R step length to maintain COG over BOS, widen BOS, and decrease compensatory trunk extension to advance RLE during gait. Patient performed rapid stepping to 4" step with LLE using SBQC for forced use RLE with verbal/tactile cues for knee extension in stance and full trunk/hip extension. Sit <> stand to fatigue with RUE HHA to prevent subluxation with focus on equal WB through BLE progressed to sit <> stand to fatigue with RUE HHA and LLE on 4" step to facilitate increased RLE WB. Quadruped with small bench in front with reaching using LUE across midline to facilitate increased RUE weightbearing and weight shift to R. Progressed to prolonged quadruped with assist at R tricep for stabilization, attempted unweighting LUE to promote increased RUE WB but patient with consistent LOB. Patient  ambulated back towards room x 90 ft with mod A overall, patient left sitting in wheelchair to return to room with family.    Therapy Documentation Precautions:  Precautions Precautions: Fall Precaution Comments: R sided weakness Restrictions Weight Bearing Restrictions: No Pain: Pain Assessment Pain Assessment: No/denies pain  See Function Navigator for Current Functional Status.   Therapy/Group: Individual Therapy  [name], [place] Gustafson A 06/26/2015, 11:01 AM

## 2015-06-26 NOTE — Progress Notes (Signed)
VASCULAR LAB PRELIMINARY  PRELIMINARY  PRELIMINARY  PRELIMINARY  Bilateral lower extremity venous duplex  completed.    Preliminary report:  Bilateral:  No evidence of DVT, superficial thrombosis, or Baker's Cyst.   Arnez Stoneking, RVT 06/26/2015, 11:49 AM

## 2015-06-27 ENCOUNTER — Inpatient Hospital Stay (HOSPITAL_COMMUNITY): Payer: 59 | Admitting: Occupational Therapy

## 2015-06-27 ENCOUNTER — Encounter (HOSPITAL_COMMUNITY): Payer: 59

## 2015-06-27 ENCOUNTER — Inpatient Hospital Stay (HOSPITAL_COMMUNITY): Payer: 59 | Admitting: Speech Pathology

## 2015-06-27 ENCOUNTER — Inpatient Hospital Stay (HOSPITAL_COMMUNITY): Payer: 59

## 2015-06-27 DIAGNOSIS — S066X4A Traumatic subarachnoid hemorrhage with loss of consciousness of 6 hours to 24 hours, initial encounter: Secondary | ICD-10-CM

## 2015-06-27 DIAGNOSIS — S069X4S Unspecified intracranial injury with loss of consciousness of 6 hours to 24 hours, sequela: Secondary | ICD-10-CM
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
## 2015-06-27 NOTE — Progress Notes (Signed)
[place] PHYSICAL MEDICINE & REHABILITATION     PROGRESS NOTE    Subjective/Complaints: Right leg feels "tense". Wearing PRAFO. Mild pain behind left ey. ROS: Pt denies fever, rash/itching, headache, blurred or double vision, nausea, vomiting, abdominal pain, diarrhea, + constipation  Objective: Vital Signs: Blood pressure 110/70, pulse 71, temperature 98.6 F (37 C), temperature source Oral, resp. rate 18, SpO2 95 %. No results found. No results for input(s): WBC, HGB, HCT, PLT in the last 72 hours. No results for input(s): NA, K, CL, GLUCOSE, BUN, CREATININE, CALCIUM in the last 72 hours.  Invalid input(s): CO CBG (last 3)  No results for input(s): GLUCAP in the last 72 hours.  Wt Readings from Last 3 Encounters:  06/20/15 91.7 kg (202 lb 2.6 oz)    Physical Exam:  Constitutional: He is oriented to person, place, and time. He appears well-developed and well-nourished. He appears lethargic. He is easily aroused.  Flat affect. Fairly alert.    HENT:  Head: Normocephalic.  Tongue with white coating.  Eyes: Right eye exhibits no discharge. Left eye exhibits discharge. Left conjunctiva is injected. Left pupil is not reactive.  Left ptosis with decrease in lid edema/ecchymosis. Right - normal in appearance  Neck: Normal range of motion. Neck supple.  Cardiovascular: Normal rate and regular rhythm.  No murmur heard. Respiratory: Effort normal and breath sounds normal. No respiratory distress. He has no wheezes.  GI: Soft. Bowel sounds are normal. He exhibits no distension. There is no tenderness. No rebound Musculoskeletal: He exhibits no edema or tenderness.  Neurological: He is oriented to person, place, and time and easily aroused. He is unable to lift his left eye lid. Pupil non-reactive, has mild medial movement rather than abduction (as I previously stated) Right facial weakness with mild to moderate dysarthria and right tongue deviation. Dense right  hemiparesis of the UE: 0/5 prox to distal. RLE: still with extensor tone, grossly 1-2/5 HF,KE, ADF/APF but inconsistent. Sensation 1/2 right face and RUE, perhaps 1+ RLE. Lacks insight and awareness of deficits.  Motor strength is 0/5 in the right deltoid, biceps, triceps, grip Trace right hip knee extensor synergy otherwise 0/5 in the right lower extremity 5/5 on the left upper and left lower extremity    Psychiatric: His affect is blunt. His speech is delayed and slurred. He is slowed.   Assessment/Plan: 1. Functional deficits secondary to TBI with [facility] which require 3+ hours per day of interdisciplinary therapy in a comprehensive inpatient rehab setting. Physiatrist is providing close team supervision and 24 hour management of active medical problems listed below. Physiatrist and rehab team continue to assess barriers to discharge/monitor patient progress toward functional and medical goals.  Function:  Bathing Bathing position Bathing activity did not occur: Refused Position: Systems developer parts bathed by patient: Right arm, Chest, Abdomen, Front perineal area, Right upper leg, Left upper leg, Right lower leg, Left lower leg, Buttocks Body parts bathed by helper: Left arm, Back  Bathing assist Assist Level: Touching or steadying assistance(Pt > 75%)      Upper Body Dressing/Undressing Upper body dressing Upper body dressing/undressing activity did not occur: Refused What is the patient wearing?: Pull over shirt/dress     Pull over shirt/dress - Perfomed by patient: Thread/unthread left sleeve, Put head through opening, Pull shirt over trunk Pull over shirt/dress - Perfomed by helper: Thread/unthread right sleeve        Upper body assist Assist Level: Touching or steadying assistance(Pt > 75%)  Lower Body Dressing/Undressing Lower body dressing Lower body dressing/undressing activity did not occur: Refused What is the patient wearing?: Underwear, Pants,  Socks, Shoes Underwear - Performed by patient: Thread/unthread left underwear leg, Pull underwear up/down Underwear - Performed by helper: Thread/unthread right underwear leg Pants- Performed by patient: Thread/unthread left pants leg, Pull pants up/down Pants- Performed by helper: Thread/unthread right pants leg     Socks - Performed by patient: Don/doff left sock Socks - Performed by helper: Don/doff right sock   Shoes - Performed by helper: Don/doff right shoe, Don/doff left shoe, Fasten right, Fasten left          Lower body assist        Toileting Toileting     Toileting steps completed by helper: Adjust clothing prior to toileting, Performs perineal hygiene, Adjust clothing after toileting    Toileting assist Assist level: Two helpers   Transfers Chair/bed transfer   Chair/bed transfer method: Ambulatory Chair/bed transfer assist level: Touching or steadying assistance (Pt > 75%) Chair/bed transfer assistive device: Armrests, Cane     Locomotion Ambulation     Max distance: 90 ft Assist level: Moderate assist (Pt 50 - 74%)   Wheelchair   Type: Manual Max wheelchair distance: 165 ft Assist Level: Supervision or verbal cues  Cognition Comprehension Comprehension assist level: Understands basic 90% of the time/cues < 10% of the time  Expression Expression assist level: Expresses basic 75 - 89% of the time/requires cueing 10 - 24% of the time. Needs helper to occlude trach/needs to repeat words.  Social Interaction Social Interaction assist level: Interacts appropriately 75 - 89% of the time - Needs redirection for appropriate language or to initiate interaction.  Problem Solving Problem solving assist level: Solves basic 75 - 89% of the time/requires cueing 10 - 24% of the time  Memory Memory assist level: Recognizes or recalls 75 - 89% of the time/requires cueing 10 - 24% of the time   Medical Problem List and Plan: 1. Functional deficits secondary to TBI with  SAH . RIght hemiparesis dense in UE , emerging synergy RLE  -begin CIR therapies 2. DVT Prophylaxis/Anticoagulation: Mechanical: Sequential compression devices, below knee Bilateral lower extremities.  -dopplers ordered due to dense Right hemiparesis and immobility--negative 3. Pain Management: Will continue oxycodone prn. encouraged use as needed.   -robaxin prn for spasms, consider something scheduled for tone 4. Mood: Monitor as mentation improves. LCSW to follow with patient for evaluation and support.  5. Neuropsych: This patient is not capable of making decisions on his own behalf. 6. Skin/Wound Care: Routine pressure relief measures. Monitor L-eye for resolution of edema.  7. Fluids/Electrolytes/Nutrition: Monitor I/O. continue nutritional supplements   -Sodium 134  I .  8. Constipation:  Senna S scheduled. Will increase and give supp today 9. Urinary retention:  Urecholine increased to qid. Continue Flomax. Emptying completely now,   10. Left retrobulbar hemorrhage with RAPD: continue observation and conservative care. Only mild pain currently LOS (Days) 4 A FACE TO FACE EVALUATION WAS PERFORMED  Cedricka Sackrider T 06/27/2015 9:21 AM
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
## 2015-06-27 NOTE — Progress Notes (Addendum)
Physical Therapy Session Note  Patient Details  Name: Pieter PartridgeJessie Cabriales MRN: [id number] Date of Birth: [birth year]  Today's Date: 06/27/2015 PT Individual Time: [id number] PT Individual Time Calculation (min): 30 min   Short Term Goals: Week 1:  PT Short Term Goal 1 (Week 1): Patient will ambulate x 100 ft using LRAD with assist of one person.  PT Short Term Goal 2 (Week 1): Patient will perform bed <> wheelchair transfers with consistent min A.  PT Short Term Goal 3 (Week 1): Patient will perform bed mobility with consistent min A and min cues for sequencing and technique.  PT Short Term Goal 4 (Week 1): Patient will negotiate up/down 12 stairs using 1 rail with mod A x 1.  PT Short Term Goal 5 (Week 1): Patient will maintain dynamic standing balance x 5 min with mod A overall.   Skilled Therapeutic Interventions/Progress Updates:  tx focused on neuromuscular re-education via forced use, positioning, manual cues, VCs for activities below. Pt wore R blue rocker AFO during session for R knee control.  Pt passed off to OT in gym at end of session.     Therapy Documentation Precautions:  Precautions Precautions: Fall Precaution Comments: R sided weakness Restrictions Weight Bearing Restrictions: No   Pain: Pain Assessment Pain Assessment: No/denies pain   Other Treatments: Treatments Therapeutic Activity: R heel cord stretch in sitting; R lateral leans over bolster in sitting while reaching across midline LUE; reaching up and to R in standing biased to R; sit>< stand without use of UEs focusing on eccentric control; R plevic protraction/retraction during reciprocal scooting in unsupported sitting Neuromuscular Facilitation: Right;Upper Extremity;Lower Extremity;Forced use;Activity to increase grading;Activity to increase timing and sequencing;Activity to increase sustained activation;Activity to increase lateral weight shifting;Activity to increase anterior-posterior weight  shifting Weight Bearing Technique Weight Bearing Technique: Yes RUE Weight Bearing Technique: Forearm standing;Forearm seated   See Function Navigator for Current Functional Status.   Therapy/Group: Individual Therapy  Carmell Elgin 06/27/2015, 3:53 PM

## 2015-06-27 NOTE — Progress Notes (Signed)
Occupational Therapy Session Note  Patient Details  Name: Dakota [name] MRN: [id number] Date of Birth: [birth year]  Today's Date: 06/27/2015 OT Individual Time: [id number] OT Individual Time Calculation (min): 30 min    Skilled Therapeutic Interventions/Progress Updates:    Session 1:  Rolled pt down to the therapy gym with his dad accompanying him and his spouse also coming in 1/2 way through the session.  Transferred to the therapy mat stand pivot with mod assist.  Focused session weightbearing in the RUE and active trunk shortening on the right side as well.  Had pt place the RUE on the mat while sitting and reaching across his body, to the right side to place clothespins.  Pt needing max facilitation to maintain right elbow extension and to keep from falling onto his right side.  Pt relying on the activity in left trunk to help him return to midline, however when therapist kept him from initiating this movement with his head he was able to demonstrate greater activity in the right trunk and trace elbow extension in the right arm.  Progressed to working on reciprical scooting, including sequencing an weightshift during session as well.  Pt needing mod assist to reciprically scoot the right side.  Pt left in wheelchair in the gym at end of session with family supervising and PT coming to see him.    Session 2 (15:35-16:02)  Pt sat EOM and worked on Best boyUE neuromuscular re-education using therapy ball balanced on board and using tilted stool.  He was able to initiate small movements of internal rotation of the right shoulder using therapy ball multiple repetitions.  Min facilitation for external rotation 50% of the time and to decreased shoulder compensations.  He was able to perform active shoulder flexion using the tilted stool multiple times as well.  Pt did better when force was applied by the therapist to keep him from pushing it forward, then when the therapist would let go he would push the stool  forward with greater force.  Pt transferred back to the wheelchair with min assist stand pivot, using right AFO.  He pushed himself back to his room with his father accompanying him.  Father reports he will stay with the pt so no safety belt was used with family present.     Therapy Documentation Precautions:  Precautions Precautions: Fall Precaution Comments: R sided weakness Restrictions Weight Bearing Restrictions: No  Pain: Pain Assessment Pain Assessment: No/denies pain   See Function Navigator for Current Functional Status.   Therapy/Group: Individual Therapy  Berkley Wrightsman OTR/L 06/27/2015, 4:38 PM

## 2015-06-27 NOTE — Progress Notes (Signed)
Occupational Therapy Session Note  Patient Details  Name: Dakota [name] MRN: [id number] Date of Birth: 12-08-1985  Today's Date: 06/27/2015 OT Individual Time:  - 1300-1400  (60 min)      Short Term Goals: Week 1:  OT Short Term Goal 1 (Week 1): Pt will transfer to the toilet with min A using a squat pivot transfer. OT Short Term Goal 2 (Week 1): Pt will complete toileting with steadying A. OT Short Term Goal 3 (Week 1): Pt will don shirt with set up. OT Short Term Goal 4 (Week 1): Pt will dress LB with min A. OT Short Term Goal 5 (Week 1): Pt will complete RUE self ROM ex with min cues.  Skilled Therapeutic Interventions/Progress Updates:    Pt sitting in wc with wifet.  Pt agreed to bathe and dress at shower level.  Performed transfer to bench and did sit to stand with min assist using grab bar.Needed cues for locking wc and letting therapist know when going from sit to stand.  Dressed with cues to put RLE/RUE in first.  DId AAROM to RUE with education to wife to assist pt with moving arm.   Left in room and left with all needs in reach and wife present.   Therapy Documentation Precautions:  Precautions Precautions: Fall Precaution Comments: R sided weakness Restrictions Weight Bearing Restrictions: No    :   Pain: Pain Assessment Pain Assessment: No/denies pain      See Function Navigator for Current Functional Status.   Therapy/Group: Individual Therapy  Humberto Sealsdwards, Cashel Bellina J 06/27/2015, 12:41 PM

## 2015-06-27 NOTE — Progress Notes (Signed)
Recreational Therapy Session Note  Patient Details  Name: Dakota [name] MRN: [id number] Date of Birth: 23-May-1986 Today's Date: 06/27/2015  Pt participated in animal assisted therapy seated w/c level using RUE with hand over hand assist to pet the dog and min instructional cues to attend to the right.   Dakota [name] 06/27/2015, 3:22 PM

## 2015-06-27 NOTE — Progress Notes (Signed)
Physical Therapy Session Note  Patient Details  Name: Dakota [name] MRN: [id number] Date of Birth: 01-Aug-1986  Today's Date: 06/27/2015 PT Individual Time: 1100-1200 PT Individual Time Calculation (min): 60 min   Short Term Goals: Week 1:  PT Short Term Goal 1 (Week 1): Patient will ambulate x 100 ft using LRAD with assist of one person.  PT Short Term Goal 2 (Week 1): Patient will perform bed <> wheelchair transfers with consistent min A.  PT Short Term Goal 3 (Week 1): Patient will perform bed mobility with consistent min A and min cues for sequencing and technique.  PT Short Term Goal 4 (Week 1): Patient will negotiate up/down 12 stairs using 1 rail with mod A x 1.  PT Short Term Goal 5 (Week 1): Patient will maintain dynamic standing balance x 5 min with mod A overall.   Skilled Therapeutic Interventions/Progress Updates:    Donned shoes and AFO on R to prepare for session - therapist performed for time management. Pt propelled w/c on unit to therapy gym at supervision level with cues for attention to R during turns for obstacle negotiation.   Focused session on therapeutic activities and neuro re-ed to address balance, coordination, postural control, forced use of RUE and RLE including gait training with narrow based QC (x 3 trial x 65', x 75', and then x 75') and R AFO with focus on heel strike, weightshifting for R foot clearance, cues for widening BOS and therapist providing RUE support at min to light mod assist level (mod assist needed for turn), standing balance and reaching activity with 4" step under LLE to increase WB through RLE with LUE and addressing elongation of trunk on L WB through RUE with therapist support, propped on R elbow and weightshifting through RUE on mat for approximation through RUE shoulder and forearm/hand as well as addressing shortening/elongation of trunk, and stair negotiation up/down 8 steps with cues for technique and min to light mod assist for balance.  Pt's wife and dad present to observe during session. Pt making great progress.   Therapy Documentation Precautions:  Precautions Precautions: Fall Precaution Comments: R sided weakness Restrictions Weight Bearing Restrictions: No    Pain: Pain Assessment Pain Assessment: No/denies pain   See Function Navigator for Current Functional Status.   Therapy/Group: Individual Therapy and Co-Treatment with TR last 30 min of session  Karolee StampsGray, Tariana Moldovan Darrol PokeBrescia  Tifanny Dollens B. Reise Hietala, PT, DPT  06/27/2015, 12:20 PM

## 2015-06-27 NOTE — Progress Notes (Signed)
Speech Language Pathology Daily Session Note  Patient Details  Name: Dakota [name] MRN: [id number] Date of Birth: 1986/02/24  Today's Date: 06/27/2015 SLP Individual Time: 1003-1100 SLP Individual Time Calculation (min): 57 min  Short Term Goals: Week 1: SLP Short Term Goal 1 (Week 1): Pt will consume presentations of his currently prescribed diet with supervision cues for use of swallowing precautions.  SLP Short Term Goal 2 (Week 1): Pt will consume trials of advanced consistencies with minimal overt s/s of aspiration and supervision cues for use of swallowing precautions over 2 targeted sessions prior to advancement.  SLP Short Term Goal 3 (Week 1): Pt will utilize memory compensatory strategies to facilitate recall of daily information wtih min assist verbal cues.   SLP Short Term Goal 4 (Week 1): Pt will complete basic to semi-complex self care and/or home management tasks with min assist verbal cues for functional problem solving.   SLP Short Term Goal 5 (Week 1): Pt will utilize increased vocal intensity and overarticulation to achieve intelligibility in conversation with supervision.   SLP Short Term Goal 6 (Week 1): Pt will recognize and correct errors in the moment during functional tasks with min assist verbal cues.    Skilled Therapeutic Interventions:  Pt was seen for skilled ST targeting goals for dysphagia and cognition.  SLP facilitated the session with trials of regular textures to continue working towards diet progression.  Pt consumed regular textures and thin liquids via straw and demonstrated complete clearance of solids from the oral cavity post swallow.  Pt demonstrated x1 immediate cough following straw sips of thins.  Recommend observing pt with advanced textures during a meal prior to diet upgrade.  SLP also facilitated the session with a semi-complex scheduling task targeting functional problem solving.  Pt planned, executed, and organized a problem solving strategies to  schedule 9 targeted items when given various criteria and time constraints for 100% accuracy with x2 min assist verbal cues to recognize and correct errors.  SLP provided brief skilled education with pt and pt's wife regarding rationale behind current goals and progress in therapies as well as follow up/discharge recommendations.  All questions were answered to their satisfaction at this time.  Pt was left in wheelchair with family present.  Continue per current plan of care.    Function:  Eating Eating   Modified Consistency Diet: Yes Eating Assist Level: Supervision or verbal cues           Cognition Comprehension Comprehension assist level: Understands basic 90% of the time/cues < 10% of the time  Expression   Expression assist level: Expresses basic 75 - 89% of the time/requires cueing 10 - 24% of the time. Needs helper to occlude trach/needs to repeat words.  Social Interaction Social Interaction assist level: Interacts appropriately 50 - 74% of the time - May be physically or verbally inappropriate.  Problem Solving Problem solving assist level: Solves basic 75 - 89% of the time/requires cueing 10 - 24% of the time  Memory Memory assist level: Recognizes or recalls 75 - 89% of the time/requires cueing 10 - 24% of the time    Pain Pain Assessment Pain Assessment: No/denies pain  Therapy/Group: Individual Therapy  Dakota [name], Dakota [name] 06/27/2015, 12:44 PM

## 2015-06-27 NOTE — Care Management Note (Signed)
[facility] Individual Statement of Services  Patient Name:  Dakota PartridgeJessie [name]  Date:  06/27/2015  Welcome to the [facility]patient [facility].  Our goal is to provide you with an individualized program based on your diagnosis and situation, designed to meet your specific needs.  With this comprehensive rehabilitation program, you will be expected to participate in at least 3 hours of rehabilitation therapies Monday-Friday, with modified therapy programming on the weekends.  Your rehabilitation program will include the following services:  Physical Therapy (PT), Occupational Therapy (OT), Speech Therapy (ST), 24 hour per day rehabilitation nursing, Therapeutic Recreaction (TR), Neuropsychology, Case Management (Social Worker), Rehabilitation Medicine, Nutrition Services and Pharmacy Services  Weekly team conferences will be held on Tuesdays to discuss your progress.  Your Social Worker will talk with you frequently to get your input and to update you on team discussions.  Team conferences with you and your family in attendance may also be held.  Expected length of stay: 18-21 days  Overall anticipated outcome: supervision  Depending on your progress and recovery, your program may change. Your Social Worker will coordinate services and will keep you informed of any changes. Your Social Worker's name and contact numbers are listed  below.  The following services may also be recommended but are not provided by the [facility]patient [facility]:   Driving Evaluations  Home Health Rehabiltiation Services  Outpatient Rehabilitation Services  Vocational Rehabilitation   Arrangements will be made to provide these services after discharge if needed.  Arrangements include referral to agencies that provide these services.  Your insurance has been verified to be:  [facility] [place] Your primary doctor is:  None (Child psychotherapistocial worker can assist with establishing primary care)  Pertinent  information will be shared with your doctor and your insurance company.  Social Worker:  PeakLucy Alexys L[place]ello, [place] [phone] or ([phone]) [phone]   Information discussed with and copy given to patient by: Amada JupiterHOYLE, Everli Rother, 06/27/2015, 3:27 PM

## 2015-06-28 ENCOUNTER — Inpatient Hospital Stay (HOSPITAL_COMMUNITY): Payer: 59

## 2015-06-28 ENCOUNTER — Ambulatory Visit (HOSPITAL_COMMUNITY): Payer: 59 | Admitting: *Deleted

## 2015-06-28 ENCOUNTER — Inpatient Hospital Stay (HOSPITAL_COMMUNITY): Payer: 59 | Admitting: Physical Therapy

## 2015-06-28 ENCOUNTER — Inpatient Hospital Stay (HOSPITAL_COMMUNITY): Payer: 59 | Admitting: Speech Pathology

## 2015-06-28 DIAGNOSIS — G8191 Hemiplegia, unspecified affecting right dominant side: Secondary | ICD-10-CM

## 2015-06-28 DIAGNOSIS — S0592XS Unspecified injury of left eye and orbit, sequela: Secondary | ICD-10-CM

## 2015-06-28 DIAGNOSIS — S066X4S Traumatic subarachnoid hemorrhage with loss of consciousness of 6 hours to 24 hours, sequela: Secondary | ICD-10-CM
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
## 2015-06-28 NOTE — Progress Notes (Signed)
Physical Therapy Session Note  Patient Details  Name: Dakota [name] MRN: [id number] Date of Birth: Feb 01, 1986  Today's Date: 06/28/2015 PT Individual Time: 1300-1430 PT Individual Time Calculation (min): 90 min   Short Term Goals: Week 1:  PT Short Term Goal 1 (Week 1): Patient will ambulate x 100 ft using LRAD with assist of one person.  PT Short Term Goal 2 (Week 1): Patient will perform bed <> wheelchair transfers with consistent min A.  PT Short Term Goal 3 (Week 1): Patient will perform bed mobility with consistent min A and min cues for sequencing and technique.  PT Short Term Goal 4 (Week 1): Patient will negotiate up/down 12 stairs using 1 rail with mod A x 1.  PT Short Term Goal 5 (Week 1): Patient will maintain dynamic standing balance x 5 min with mod A overall.   Skilled Therapeutic Interventions/Progress Updates:   Patient propelled wheelchair via L hemi technique x 200 ft with supervision. Stair training up/down 12 (6") stairs using L rail with min A, step-to pattern with one cue to descend with RLE. Gait using SBQC 2 x 150 ft with min A, focus on upright posture with forward gaze, hip flexion in swing phase, and R heel strike with decreased R step length to decrease hyperextension moment on tile and carpeted surfaces. Neuromuscular re-education via forced use RLE with verbal/tactile cues, see exercises and activities below. Gait training on treadmill at speed of 0.4-5 x 150 ft in 3:39 min, focus on decreasing compensatory strategies to achieve RLE advancement, therapist facilitating R hip flexion/heel strike during swing phase. Patient instructed in propelling wheelchair using BLE with assist to unweight RLE in order to perform knee ext. Patient educated on falls risk and importance of calling for staff assistance as patient was found in wheelchair with quick release belt undone and reported to this therapist that he wheeled himself to bathroom and performed stand pivot to urinate  without calling for assistance. RN notified of patient up without assist and patient verbalized understanding of need to call for assistance and agreeable to putting quick release belt on at end of session. Patient left sitting in wheelchair with quick release belt and 1/2 lap tray, all needs within reach.   Therapy Documentation Precautions:  Precautions Precautions: Fall Precaution Comments: R sided weakness Restrictions Weight Bearing Restrictions: No Pain: Pain Assessment Pain Assessment: No/denies pain Exercises: General Exercises - Lower Extremity Heel Slides: AAROM;Right;Supine (maxislide to decrease friction, to fatigue) Hip ABduction/ADduction: AROM;Right;Supine (with maxislide to decrease friction, to fatigue) Straight Leg Raises: AAROM;Right;Supine (to fatigue) Repetitive Sit to Stands: No upper extremities (to fatigue) Total Joint Exercises Bridges: AROM;Both;Supine (2 x 10 with ball squeeze between knees for co-contraction) Other Treatments: Treatments Therapeutic Activity: step taps to 6" step using LLE, sit <> stand without UE support with focus on eccentric control and equal WB, step taps to 2" step using RLE to facilitate hip flexors with active assist to decrease compensations Neuromuscular Facilitation: Right;Lower Extremity;Forced use;Activity to increase motor control;Activity to increase timing and sequencing;Activity to increase grading;Activity to increase sustained activation   See Function Navigator for Current Functional Status.   Therapy/Group: Individual Therapy  [name], Christa Fasig A 06/28/2015, 2:35 PM

## 2015-06-28 NOTE — Progress Notes (Signed)
Recreational Therapy Session Note  Patient Details  Name: Dakota [name] MRN: [id number] Date of Birth: 28-May-1986 Today's Date: 06/28/2015   Pain: no c/o Skilled Therapeutic Interventions/Progress Updates: Session focused on activity tolerance, dynamic standing balance, WBing through RLE & RUE, visual scanning during co-treat with OT.  Pt stood with min-mod assist for balance & WBing through RUE with activity on tile surface and then transitioning to standing with L foot on a 4" block for reaching activity.  Pt required min instructional cues for weight shifting/weight bearing on right UE/LEs.  Incorporated dual tasking having pt complete simple cognitive task while maintaining balance.  Therapy/Group: Co-Treatment   Aiesha Leland 06/28/2015, 1:24 PM

## 2015-06-28 NOTE — Progress Notes (Signed)
Speech Language Pathology Daily Session Note  Patient Details  Name: Dakota PartridgeJessie [name] MRN: [id number] Date of Birth: 06-05-1986  Today's Date: 06/28/2015 SLP Individual Time: [id number] SLP Individual Time Calculation (min): 57 min  Short Term Goals: Week 1: SLP Short Term Goal 1 (Week 1): Pt will consume presentations of his currently prescribed diet with supervision cues for use of swallowing precautions.  SLP Short Term Goal 2 (Week 1): Pt will consume trials of advanced consistencies with minimal overt s/s of aspiration and supervision cues for use of swallowing precautions over 2 targeted sessions prior to advancement.  SLP Short Term Goal 3 (Week 1): Pt will utilize memory compensatory strategies to facilitate recall of daily information wtih min assist verbal cues.   SLP Short Term Goal 4 (Week 1): Pt will complete basic to semi-complex self care and/or home management tasks with min assist verbal cues for functional problem solving.   SLP Short Term Goal 5 (Week 1): Pt will utilize increased vocal intensity and overarticulation to achieve intelligibility in conversation with supervision.   SLP Short Term Goal 6 (Week 1): Pt will recognize and correct errors in the moment during functional tasks with min assist verbal cues.    Skilled Therapeutic Interventions:  Pt was seen for skilled ST targeting goals for dysphagia and dysarthria.  Pt consumed a trial meal tray of regular textures and thin liquids via straw with adequate mastication and oral clearance of solids post swallow with mod I use of rate/portion control and small bites/sips.  No overt s/s of aspiration were evident with regular textures or thin liquids via straw.   Given improvements noted at bedside and independence with use of swallowing precautions, recommend that pt be advanced to regular textures with thin liquids (straws okay) and intermittent supervision for use of swallowing precautions.  SLP also provided skilled education  regarding use of dysarthria strategies to maximize intelligibility in sentences/conversations.  Pt utilized increased vocal intensity and overarticulation during a structured barrier task with overall min assist verbal cues.  As pt became more conversational during therapy session, he was noted with ?word finding deficits intermittently.  Pt and father endorse at times that pt has difficulty "finding words."  SLP will continue to monitor communication and adjust goals as needed.  Pt left in wheelchair with father present.  Continue per current plan of care.    Function:  Eating Eating   Modified Consistency Diet: No Eating Assist Level: More than reasonable amount of time           Cognition Comprehension Comprehension assist level: Understands basic 90% of the time/cues < 10% of the time  Expression   Expression assist level: Expresses basic 75 - 89% of the time/requires cueing 10 - 24% of the time. Needs helper to occlude trach/needs to repeat words.  Social Interaction Social Interaction assist level: Interacts appropriately 90% of the time - Needs monitoring or encouragement for participation or interaction.  Problem Solving Problem solving assist level: Solves basic 75 - 89% of the time/requires cueing 10 - 24% of the time  Memory Memory assist level: Recognizes or recalls 75 - 89% of the time/requires cueing 10 - 24% of the time    Pain Pain Assessment Pain Assessment: No/denies pain  Therapy/Group: Individual Therapy  Nolon Yellin, Melanee SpryNicole L 06/28/2015, 10:59 AM

## 2015-06-28 NOTE — Progress Notes (Signed)
[facility] PHYSICAL MEDICINE & REHABILITATION     PROGRESS NOTE    Subjective/Complaints: Pt states he slept well overnight.  He believes the feeling in in left side is improving (less numbness)  ROS: Pt denies CP, SOB, n/v/d.   Objective: Vital Signs: Blood pressure 107/66, pulse 65, temperature 97.6 F (36.4 C), temperature source Oral, resp. rate 18, SpO2 95 %. No results found. No results for input(s): WBC, HGB, HCT, PLT in the last 72 hours. No results for input(s): NA, K, CL, GLUCOSE, BUN, CREATININE, CALCIUM in the last 72 hours.  Invalid input(s): CO CBG (last 3)  No results for input(s): GLUCAP in the last 72 hours.  Wt Readings from Last 3 Encounters:  06/20/15 91.7 kg (202 lb 2.6 oz)    Physical Exam:  Constitutional: He appears well-developed and well-nourished. Flat affect. Fairly alert.    HENT:  Head: Normocephalic. Left eye and face trauma Tongue with white coating.  Eyes: Right eye exhibits no discharge. Left eye exhibits discharge.   Left ptosis. Right - normal in appearance  Neck: Normal range of motion. Neck supple.  Cardiovascular: Normal rate and regular rhythm.  No murmur heard. Respiratory: Effort normal and breath sounds normal. No respiratory distress. He has no wheezes.  GI: Soft. Bowel sounds are normal. He exhibits no distension. There is no tenderness. No rebound Musculoskeletal: He exhibits tenderness.  Minimal edema in LUE.  Neurological: He is alert and oriented.  He is unable to lift his left eye lid.  Right facial weakness with mild to moderate dysarthria and right tongue deviation. Dense right hemiparesis of the UE: 0/5 prox to distal. RLE: still with extensor tone, grossly 1-2/5 HF,KE, ADF/APF but inconsistent. Lacks insight and awareness of deficits. Sensation in LUE improving.   5/5 on the left upper and left lower extremity Psychiatric: His affect is blunt. His speech is delayed and slurred. He is slowed.    Assessment/Plan: 1. Functional deficits [facility] which require 3+ hours per day of interdisciplinary therapy in a comprehensive inpatient rehab setting. Physiatrist is providing close team supervision and 24 hour management of active medical problems listed below. Physiatrist and rehab team continue to assess barriers to discharge/monitor patient progress toward functional and medical goals.  Function:  Bathing Bathing position Bathing activity did not occur: Refused Position: Systems developerhower  Bathing parts Body parts bathed by patient: Right arm, Chest, Abdomen, Front perineal area, Right upper leg, Left upper leg, Right lower leg, Left lower leg, Buttocks Body parts bathed by helper: Left arm, Back  Bathing assist Assist Level: Touching or steadying assistance(Pt > 75%)      Upper Body Dressing/Undressing Upper body dressing Upper body dressing/undressing activity did not occur: Refused What is the patient wearing?: Pull over shirt/dress     Pull over shirt/dress - Perfomed by patient: Thread/unthread left sleeve, Put head through opening, Pull shirt over trunk, Thread/unthread right sleeve Pull over shirt/dress - Perfomed by helper: Thread/unthread right sleeve        Upper body assist Assist Level: Supervision or verbal cues      Lower Body Dressing/Undressing Lower body dressing Lower body dressing/undressing activity did not occur: Refused What is the patient wearing?: Underwear, Pants, Socks, Shoes, AFO Underwear - Performed by patient: Thread/unthread left underwear leg, Pull underwear up/down, Thread/unthread right underwear leg Underwear - Performed by helper: Thread/unthread right underwear leg Pants- Performed by patient: Thread/unthread left pants leg, Pull pants up/down, Thread/unthread right pants leg Pants- Performed by helper:  Thread/unthread right pants leg Non-skid slipper socks- Performed by patient: Don/doff left sock Non-skid slipper socks-  Performed by helper: Don/doff right sock Socks - Performed by patient: Don/doff left sock Socks - Performed by helper: Don/doff right sock Shoes - Performed by patient: Don/doff left shoe Shoes - Performed by helper: Don/doff right shoe, Fasten right, Fasten left   AFO - Performed by helper: Don/doff right AFO      Lower body assist Assist for lower body dressing: Touching or steadying assistance (Pt > 75%)      Toileting Toileting     Toileting steps completed by helper: Adjust clothing prior to toileting, Performs perineal hygiene, Adjust clothing after toileting Toileting Assistive Devices: Grab bar or rail  Toileting assist Assist level: No help/no cues   Transfers Chair/bed transfer   Chair/bed transfer method: Stand pivot Chair/bed transfer assist level: Touching or steadying assistance (Pt > 75%) Chair/bed transfer assistive device: Armrests     Locomotion Ambulation     Max distance: 90 ft Assist level: Moderate assist (Pt 50 - 74%)   Wheelchair   Type: Manual Max wheelchair distance: 150 Assist Level: Supervision or verbal cues  Cognition Comprehension Comprehension assist level: Understands basic 90% of the time/cues < 10% of the time  Expression Expression assist level: Expresses basic 75 - 89% of the time/requires cueing 10 - 24% of the time. Needs helper to occlude trach/needs to repeat words.  Social Interaction Social Interaction assist level: Interacts appropriately 90% of the time - Needs monitoring or encouragement for participation or interaction.  Problem Solving Problem solving assist level: Solves basic 75 - 89% of the time/requires cueing 10 - 24% of the time  Memory Memory assist level: Recognizes or recalls 75 - 89% of the time/requires cueing 10 - 24% of the time   Medical Problem List and Plan: 1. 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 DVT  Prophylaxis/Anticoagulation: Mechanical: Sequential compression devices, below knee Bilateral lower extremities.  -dopplers ordered due to dense Right hemiparesis and immobility--negative 3. Pain Management: Will continue oxycodone prn. encouraged use as needed.   -robaxin prn for spasms, will consider something scheduled for tone 4. Mood: Monitor as mentation improves. LCSW to follow with patient for evaluation and support.  5. Neuropsych: This patient is not capable of making decisions on his own behalf. 6. Skin/Wound Care: Routine pressure relief measures. Monitor L-eye for resolution of edema.  7. Fluids/Electrolytes/Nutrition: Monitor I/O. continue nutritional supplements   -Sodium 134 10/14. 8. Constipation:  Senna scheduled. 9. Urinary retention:  Urecholine increased to qid. Continue Flomax. Emptying completely now,   10. Left retrobulbar hemorrhage with RAPD: continue observation and conservative care. Only mild pain currently  LOS (Days) 5 A FACE TO FACE EVALUATION WAS PERFORMED  Devora Tortorella Karis Juba 06/28/2015 9:13 AM

## 2015-06-28 NOTE — Progress Notes (Signed)
Occupational Therapy Session Note  Patient Details  Name: Dakota PartridgeJessie Mourer MRN: [id number] Date of Birth: 03/01/1986  Today's Date: 06/28/2015 OT Individual Time: 0700-0800 OT Individual Time Calculation (min): 60 min    Short Term Goals: Week 1:  OT Short Term Goal 1 (Week 1): Pt will transfer to the toilet with min A using a squat pivot transfer. OT Short Term Goal 2 (Week 1): Pt will complete toileting with steadying A. OT Short Term Goal 3 (Week 1): Pt will don shirt with set up. OT Short Term Goal 4 (Week 1): Pt will dress LB with min A. OT Short Term Goal 5 (Week 1): Pt will complete RUE self ROM ex with min cues.  Skilled Therapeutic Interventions/Progress Updates:    Pt resting in bed upon arrival and agreeable to participate in therapy.  Pt requested a shower this morning and engaged in BADL retraining including bathing at shower level and dressing with sit<>stand from w/c at sink.  Pt educated on compensatory techniques for bathing and dressing tasks.  Pt required hand over hand assist with RUE to assist with bathing tasks.  Pt performed sit<>stand in shower to bathe buttocks.  Pt required min verbal cues for RUE placement during tasks.  Focus on activity tolerance, sit<>stand, standing balance, functional transfers, hemi bathing and dressing techniques, compensatory strategies, and safety awareness to increased independence with BADLs.  Therapy Documentation Precautions:  Precautions Precautions: Fall Precaution Comments: R sided weakness Restrictions Weight Bearing Restrictions: No Pain: Pain Assessment Pain Assessment: No/denies pain  See Function Navigator for Current Functional Status.   Therapy/Group: Individual Therapy  [name], Alfonse Garringer Chappell 06/28/2015, 8:03 AM

## 2015-06-28 NOTE — Progress Notes (Signed)
Occupational Therapy Note  Patient Details  Name: Pieter PartridgeJessie Graham MRN: [id number] Date of Birth: 04-Aug-1986  Today's Date: 06/28/2015 OT Individual Time: 1000-1100 OT Individual Time Calculation (min): 60 min   Pt denied pain Individual therapy  Pt initially engaged in RUE NMR including weight bearing while reaching across midline for objects with LUE, weight bearing through BUE with trunk flexion on ball, and pushing through RUE to facilitate RUE weight bearing.  Pt transitioned to using RUE to push stool.  Pt noted with trace shoulder flexion. Pt required min verbal cues to isolate muscle groups vs using compensatory trunk movements.  Pt transitioned to standing tasks when joined by Recreational Therapist for co-tx. Pt required mod A for standing balance and shifting weight onto RLE during tasks.  Pt required mod verbal cues to shift weight to RLE during tasks.  Focus on increased RUE/RLE use to increase independence with BADLs and functional tasks. Lavone NeriLanier, Nabeeha Badertscher [facility]appell 06/28/2015, 11:23 AM

## 2015-06-29 ENCOUNTER — Inpatient Hospital Stay (HOSPITAL_COMMUNITY): Payer: 59 | Admitting: Physical Therapy

## 2015-06-29 ENCOUNTER — Inpatient Hospital Stay (HOSPITAL_COMMUNITY): Payer: 59

## 2015-06-29 ENCOUNTER — Inpatient Hospital Stay (HOSPITAL_COMMUNITY): Payer: 59 | Admitting: Speech Pathology

## 2015-06-29 NOTE — Patient Care Conference (Signed)
Inpatient RehabilitationTeam Conference and Plan of Care Update Date: 06/28/2015   Time: 11:20 AM    Patient Name: Dakota PartridgeJessie [name]      Medical Record Number: [id number]  Date of Birth: April 28, 1986 Sex: Male         Room/Bed: 4M06C/4M06C-01 Payor Info: Payor: [employer] / Plan: [employer] OTHER / Product Type: *No Product type* /    Admitting Diagnosis: TBI , SAH  Admit Date/Time:  06/23/2015  4:20 PM Admission Comments: No comment available   Primary Diagnosis:  <principal problem not specified> Principal Problem: <principal problem not specified>  Patient Active Problem List   Diagnosis Date Noted  . Urinary retention 06/23/2015  . Acute respiratory failure (HCC) 06/23/2015  . Traumatic subarachnoid bleed with LOC of 6 hours to 24 hours (HCC) 06/23/2015  . Right hemiparesis (HCC) 06/23/2015  . Fall 06/22/2015  . Ocular trauma of right eye 06/22/2015  . TBI (traumatic brain injury) (HCC) 06/16/2015    Expected Discharge Date: Expected Discharge Date: 07/14/15  Team Members Present: Physician leading conference: Dr. Maryla MorrowAnkit Patel Social Worker Present: Amada JupiterLucy Duc Crocket, LCSW Nurse Present: Other (comment) Cheri Guppy(Jennifer Bailey, RN) PT Present: Bayard Huggerebecca Varner, PT OT Present: Roney MansJennifer Smith, OT;Ardis Rowanom [name], COTA SLP Present: Jackalyn LombardNicole Page, SLP     Current Status/Progress Goal Weekly Team Focus  Medical   TBI with Right sided weakness and left eye edema  Improvement in LUE/LLE function  Assessment of non-functional tone   Bowel/Bladder   Continent of bowel and bladder. LBM 06/25/16.   Remain continent of bowel and bladder with min assist while in rehab.   Offer toileting prn. Encourage use of stool softners.    Swallow/Nutrition/ Hydration   Regular textures, thin liquids; intermittent supervision   Mod I   toleration of recently advanced textures    ADL's   bathing-min A; dressing-mod A; funcitonal transfers-min A;   supervision overall for BADLs; min A for IADLs  RUE NMR,  functional transfers, BADL retraining, family education   Mobility   min A overall gait using SBQC and stairs with 1 rail  supervision  RUE/RLE NMR, functional mobility training, standing balance, activity tolerance, safety awareness, pt/family education   Communication   mild-moderate dysarthria, questionable word finding deficits   supervision   education and carryover of compensatory strategies in sentences/conversations    Safety/Cognition/ Behavioral Observations  Mild deficits for semi-complex problem solving and higher level attention, min assist    supervision   address anticipatory awareness of deficits, higher level problem solving and attention   Pain   Occasional C/O Pain. Ultram and Oxy IR used prn.   Less than 4 on a scale of 0-10.   Assess for pain Q 4 hours and prn. Treat accordingly.    Skin   Pt Left eye swollen/bruised. Eritromycin ointement applied. 2 Staples to Right Head  Remain free of infection and skin breakdown.   Assess skin Q shift and prn. Encourage pressure relief methods to prevent breakdown.     Rehab Goals Patient on target to meet rehab goals: Yes *See Care Plan and progress notes for long and short-term goals.  Barriers to Discharge: Non-functional tone, L hemiparesis    Possible Resolutions to Barriers:  Accomodation to deficits and medications for tone if necessary    Discharge Planning/Teaching Needs:  home with wife and father providing 24/7 supervision      Team Discussion:  Making excellent gains with tx.  Tone better with AFO.  Steady to mod assist ADLs.  Trace  movement in shoulder.  Slightly impulsive.  Mild higher level cognitive impairments.  Some word finding issues.  Revisions to Treatment Plan:  None   Continued Need for Acute Rehabilitation Level of Care: The patient requires daily medical management by a physician with specialized training in physical medicine and rehabilitation for the following conditions: Daily direction of a  multidisciplinary physical rehabilitation program to ensure safe treatment while eliciting the highest outcome that is of practical value to the patient.: Yes Daily medical management of patient stability for increased activity during participation in an intensive rehabilitation regime.: Yes Daily analysis of laboratory values and/or radiology reports with any subsequent need for medication adjustment of medical intervention for : Neurological problems  Dakota [name] 06/29/2015, 1:15 PM

## 2015-06-29 NOTE — Progress Notes (Signed)
Physical Therapy Session Note  Patient Details  Name: Dakota PartridgeJessie Rusnak MRN: [id number] Date of Birth: 02-06-1986  Today's Date: 06/29/2015 PT Individual Time: 1000-1100 and 1635-1705 PT Individual Time Calculation (min): 60 min and 30 min  Short Term Goals: Week 1:  PT Short Term Goal 1 (Week 1): Patient will ambulate x 100 ft using LRAD with assist of one person.  PT Short Term Goal 2 (Week 1): Patient will perform bed <> wheelchair transfers with consistent min A.  PT Short Term Goal 3 (Week 1): Patient will perform bed mobility with consistent min A and min cues for sequencing and technique.  PT Short Term Goal 4 (Week 1): Patient will negotiate up/down 12 stairs using 1 rail with mod A x 1.  PT Short Term Goal 5 (Week 1): Patient will maintain dynamic standing balance x 5 min with mod A overall.   Skilled Therapeutic Interventions/Progress Updates:   Treatment 1: Gait using SBQC 2 x 180 ft with min A overall, patient demonstrating RLE circumduction with resulting narrow BOS to achieve R foot clearance. To widen BOS with focus on R hip flexion during swing phase to clear R foot, patient ambulated with balance beam between feet using SBQC with min A overall, increased BOS and decreased R knee hyperextension noted with min-mod carryover with progression to overground ambulation. Initiated hands-on family training with wife for ambulation 2 x 30 ft, 2 episodes of LOB requiring therapist to intervene to prevent fall, patient and wife educated on need to slow down to allow wife to assist and problem solving when navigating narrow spaces as well as weight shifting to L in order to clear RLE. Plan to continue family training due to safety concerns at this time. Stair training for functional strengthening up/down 12 (6") stairs using L rail with assist to lead placing R foot (impaired side) on step when ascending, min guard/A overall. Standing with cane for LUE support, patient kicked ball using RLE with  focus on hip flexion and manual cues to decrease compensatory trunk lean; without cane and no UE support kicked ball using LLE with focus on RLE stability in stance, mod A overall for balance. Patient ambulated back to room and left sitting on toilet with wife in room.   Treatment 2: Focus on initiation of bed level HEP for RLE to complete with family between therapies. Performed supine assisted heel slides and hip abduction with maxislide to decrease friction, SLR, hooklying abduction/adduction, BLE bridging squeezing ball between knees to facilitate co-contraction, single leg RLE bridging to fatigue and PROM heel cords and hamstrings 2 x 60 sec each. Patient's wife and father present and instructed in how to assist patient with exercises. All verbalized understanding. Discussed possible community outing at beginning of next week with recreational therapist, patient agreeable to outing and father reporting he can attend for family training (wife in class at that time). Patient ambulated back to room x 180 ft using SBQC with min A overall and left sitting in wheelchair with family present.    Therapy Documentation Precautions:  Precautions Precautions: Fall Precaution Comments: R sided weakness Restrictions Weight Bearing Restrictions: No Pain: Pain Assessment Pain Assessment: No/denies pain  See Function Navigator for Current Functional Status.   Therapy/Group: Individual Therapy  [name], Jasiyah Paulding A 06/29/2015, 10:43 AM

## 2015-06-29 NOTE — Progress Notes (Signed)
Speech Language Pathology Daily Session Note  Patient Details  Name: Dakota [name] MRN: [id number] Date of Birth: 12/09/1985  Today's Date: 06/29/2015 SLP Individual Time: [id number] SLP Individual Time Calculation (min): 58 min  Short Term Goals: Week 1: SLP Short Term Goal 1 (Week 1): Pt will consume presentations of his currently prescribed diet with supervision cues for use of swallowing precautions.  SLP Short Term Goal 2 (Week 1): Pt will consume trials of advanced consistencies with minimal overt s/s of aspiration and supervision cues for use of swallowing precautions over 2 targeted sessions prior to advancement.  SLP Short Term Goal 3 (Week 1): Pt will utilize memory compensatory strategies to facilitate recall of daily information wtih min assist verbal cues.   SLP Short Term Goal 4 (Week 1): Pt will complete basic to semi-complex self care and/or home management tasks with min assist verbal cues for functional problem solving.   SLP Short Term Goal 5 (Week 1): Pt will utilize increased vocal intensity and overarticulation to achieve intelligibility in conversation with supervision.   SLP Short Term Goal 6 (Week 1): Pt will recognize and correct errors in the moment during functional tasks with min assist verbal cues.    Skilled Therapeutic Interventions: Skilled treatment session focused on addressing dysarthria and cognitive goals. Pt required Min verbal cues to recall previously taught speech intelligibility strategies and required moderate verbal cues to utilize strategies in a structured sentence level description task.  Patient demonstrated a flat affect throughout session and required encouragement to participate.  Patient demonstrate adequate intellectual awareness of deficits but required Mod question cues to anticipate how deficits would impact him after discharge.  Continue with current plan of care.     Pain Pain Assessment Pain Assessment: No/denies pain  Therapy/Group:  Individual Therapy  Charlane FerrettiMelissa Harvy Riera, M.A., CCC-SLP [phone]  Dakota [name] 06/29/2015, 4:26 PM

## 2015-06-29 NOTE — Progress Notes (Signed)
Occupational Therapy Session Note  Patient Details  Name: Dakota [name] MRN: [id number] Date of Birth: 1986/08/05  Today's Date: 06/29/2015 OT Individual Time: 1100-1200 OT Individual Time Calculation (min): 60 min    Short Term Goals: Week 1:  OT Short Term Goal 1 (Week 1): Pt will transfer to the toilet with min A using a squat pivot transfer. OT Short Term Goal 2 (Week 1): Pt will complete toileting with steadying A. OT Short Term Goal 3 (Week 1): Pt will don shirt with set up. OT Short Term Goal 4 (Week 1): Pt will dress LB with min A. OT Short Term Goal 5 (Week 1): Pt will complete RUE self ROM ex with min cues.  Skilled Therapeutic Interventions/Progress Updates:    Pt resting in w/c with wife present upon arrival.  Pt engaged in BADL retraining including bathing at shower level and dressing with sit<>stand from w/c at sink.  Pt issued long handle sponge to assist with LB bathing tasks.  Pt also educated on compensatory techniques to facilitate bathing RUE.  Pt required steady A for standing when bathing buttocks.  Pt issued shoe buttons to assist with fastening shoelaces.  Pt requires assistance with donning AFO on R foot.  Pt required min verbal cues to incorporate hemi dressing techniques with donning socks and pants.  Focus on BADL retraining, functional transfers, sit<>stand, standing balance, family education, and safety awareness.  Pt requires HOH assist to use RUE in functional tasks.   Therapy Documentation Precautions:  Precautions Precautions: Fall Precaution Comments: R sided weakness Restrictions Weight Bearing Restrictions: No   Pain: Pain Assessment Pain Assessment: No/denies pain  See Function Navigator for Current Functional Status.   Therapy/Group: Individual Therapy  [name], [name] 06/29/2015, 12:27 PM

## 2015-06-29 NOTE — Progress Notes (Signed)
Occupational Therapy Note  Patient Details  Name: Pieter PartridgeJessie Hersch MRN: [id number] Date of Birth: [birth year]  Today's Date: 06/29/2015 OT Individual Time: 1300-1400 OT Individual Time Calculation (min): 60 min   Pt denied pain Individual Therapy  Pt resting in w/c with wife and father present.  Pt propelled w/c to ADL apartment/bathroom and practiced tub transfer bench transfers.  Pt amb with SBQC into/out of bathroom and performed transfer.  Transfer performed with close supervision.  Pt required steady A for ambulation.  Pt noted with LOB X 1 and required max A for correction.  Pt noted with slight inattention to right when ambulating and propelling w/c. Pt transitioned to therapy gym and engaged in RUE NMR including weight bearing, RUE PROM, RUE AAROM.  Family and patient educated on RUE positioning and AAROM.   Lavone NeriLanier, Yahayra Geis [facility]appell 06/29/2015, 2:55 PM

## 2015-06-29 NOTE — Progress Notes (Signed)
Social Work Patient ID: Dakota [name], male   DOB: [birth year], 29 y.o.   MRN: [id number]   Have reviewed team conference with pt, wife and father.  All aware and agreeable with targeted d/c date of 11/3 with supervision level goals.  Pleased with progress and denies any concerns or questions at this time.  Will continue to follow for support and d/c planning needs.  Suleika Donavan, LCSW

## 2015-06-29 NOTE — Progress Notes (Signed)
Pt removed prafo boot during the night. Foye ClockKristina Mer[facility] South Surgery CenterDoggett,RN

## 2015-06-29 NOTE — Progress Notes (Signed)
[facility]     PROGRESS NOTE    Subjective/Complaints: Pt seen with wife at bedside.  Pt slept well overnight.  He believes he is making gradual improvements in therapies.    ROS: Pt denies CP, SOB, n/v/d.   Objective: Vital Signs: Blood pressure 118/60, pulse 69, temperature 98.1 F (36.7 C), temperature source Oral, resp. rate 18, SpO2 98 %. No results found. No results for input(s): WBC, HGB, HCT, PLT in the last 72 hours. No results for input(s): NA, K, CL, GLUCOSE, BUN, CREATININE, CALCIUM in the last 72 hours.  Invalid input(s): CO CBG (last 3)  No results for input(s): GLUCAP in the last 72 hours.  Wt Readings from Last 3 Encounters:  06/20/15 91.7 kg (202 lb 2.6 oz)    Physical Exam:  Constitutional: He appears well-developed and well-nourished. Flat affect. Alert.    HENT:  Head: Normocephalic. Left eye and face trauma Eyes: Right eye exhibits no discharge.  Left ptosis. Right - normal in appearance  Neck: Normal range of motion. Neck supple.  Cardiovascular: Normal rate and regular rhythm.  No murmur heard. Respiratory: Effort normal and breath sounds normal. No respiratory distress. He has no wheezes.  GI: Soft. Bowel sounds are normal. He exhibits no distension. There is no tenderness. No rebound Musculoskeletal: He exhibits tenderness.  Minimal edema in LUE.  Neurological: He is alert and oriented.  He is unable to lift his left eye lid.  Right facial weakness with mild to moderate dysarthria and right tongue deviation. Dense right hemiparesis of the UE: 2-/5 shoulder, 0/5 distal. RLE: still with extensor tone, grossly 1-2/5 HF,KE, ADF/APF but inconsistent. Lacks insight and awareness of deficits. Sensation in LUE improving.   5/5 on the left upper and left lower extremity Psychiatric: His affect is blunt. His speech is delayed and slurred. He is slowed.   Assessment/Plan: 1. Functional deficits secondary to TBI  with [facility] which require 3+ hours per day of interdisciplinary therapy in a comprehensive inpatient rehab setting. Physiatrist is providing close team supervision and 24 hour management of active medical problems listed below. Physiatrist and rehab team continue to assess barriers to discharge/monitor patient progress toward functional and medical goals.  Function:  Bathing Bathing position Bathing activity did not occur: Refused Position: Systems developer parts bathed by patient: Right arm, Chest, Abdomen, Front perineal area, Right upper leg, Left upper leg, Right lower leg, Left lower leg, Buttocks Body parts bathed by helper: Left arm, Back  Bathing assist Assist Level: Touching or steadying assistance(Pt > 75%)      Upper Body Dressing/Undressing Upper body dressing Upper body dressing/undressing activity did not occur: Refused What is the patient wearing?: Pull over shirt/dress     Pull over shirt/dress - Perfomed by patient: Thread/unthread left sleeve, Put head through opening, Pull shirt over trunk, Thread/unthread right sleeve Pull over shirt/dress - Perfomed by helper: Thread/unthread right sleeve        Upper body assist Assist Level: Supervision or verbal cues      Lower Body Dressing/Undressing Lower body dressing Lower body dressing/undressing activity did not occur: Refused What is the patient wearing?: Underwear, Pants, Socks, Shoes, AFO Underwear - Performed by patient: Thread/unthread left underwear leg, Pull underwear up/down, Thread/unthread right underwear leg Underwear - Performed by helper: Thread/unthread right underwear leg Pants- Performed by patient: Thread/unthread left pants leg, Pull pants up/down, Thread/unthread right pants leg Pants- Performed by helper: Thread/unthread right pants leg Non-skid slipper socks-  Performed by patient: Don/doff left sock Non-skid slipper socks- Performed by helper: Don/doff right sock Socks - Performed by  patient: Don/doff left sock Socks - Performed by helper: Don/doff right sock Shoes - Performed by patient: Don/doff left shoe Shoes - Performed by helper: Don/doff right shoe, Fasten right, Fasten left   AFO - Performed by helper: Don/doff right AFO      Lower body assist Assist for lower body dressing: Touching or steadying assistance (Pt > 75%)      Toileting Toileting     Toileting steps completed by helper: Adjust clothing prior to toileting, Performs perineal hygiene, Adjust clothing after toileting Toileting Assistive Devices: Grab bar or rail  Toileting assist Assist level: No help/no cues   Transfers Chair/bed transfer   Chair/bed transfer method: Stand pivot Chair/bed transfer assist level: Touching or steadying assistance (Pt > 75%) Chair/bed transfer assistive device: Hospital doctorCane     Locomotion Ambulation     Max distance: 150 ft  Assist level: Touching or steadying assistance (Pt > 75%)   Wheelchair   Type: Manual Max wheelchair distance: 150 Assist Level: Supervision or verbal cues  Cognition Comprehension Comprehension assist level: Understands basic 90% of the time/cues < 10% of the time  Expression Expression assist level: Expresses basic 75 - 89% of the time/requires cueing 10 - 24% of the time. Needs helper to occlude trach/needs to repeat words.  Social Interaction Social Interaction assist level: Interacts appropriately 90% of the time - Needs monitoring or encouragement for participation or interaction.  Problem Solving Problem solving assist level: Solves basic 75 - 89% of the time/requires cueing 10 - 24% of the time  Memory Memory assist level: Recognizes or recalls 75 - 89% of the time/requires cueing 10 - 24% of the time   Medical Problem List and Plan: 1. 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 DVT Prophylaxis/Anticoagulation: Mechanical: Sequential compression  devices, below knee Bilateral lower extremities.  -dopplers -negative 3. Pain Management: Will continue oxycodone prn. encouraged use as needed.   -robaxin prn for spasms 4. Mood: Monitor as mentation improves. LCSW to follow with patient for evaluation and support.  5. Neuropsych: This patient is not capable of making decisions on his own behalf. 6. Skin/Wound Care: Routine pressure relief measures. Monitor L-eye for resolution of edema.  7. Fluids/Electrolytes/Nutrition: Monitor I/O. continue nutritional supplements   -Sodium 134 10/14. 8. Constipation:  Senna scheduled. 9. Urinary retention:  Urecholine increased to qid. Continue Flomax. Emptying completely now,   10. Left retrobulbar hemorrhage with RAPD: continue observation and conservative care. Only mild pain currently 11. Spasticity: Per discussion and coordination with therapies, tone in RLE improved with AFO and is functional at present.  Will not add medications for the time being and continue to monitor as well as encourage ROM.    LOS (Days) 6 A FACE TO FACE EVALUATION WAS PERFORMED  Edu On Karis Jubanil Yonah Tangeman 06/29/2015 9:26 AM

## 2015-06-30 ENCOUNTER — Inpatient Hospital Stay (HOSPITAL_COMMUNITY): Payer: 59 | Admitting: Speech Pathology

## 2015-06-30 ENCOUNTER — Inpatient Hospital Stay (HOSPITAL_COMMUNITY): Payer: 59 | Admitting: Physical Therapy

## 2015-06-30 ENCOUNTER — Inpatient Hospital Stay (HOSPITAL_COMMUNITY): Payer: 59

## 2015-06-30 NOTE — Progress Notes (Signed)
Physical Therapy Weekly Progress Note  Patient Details  Name: Dakota [name] MRN: [id number] Date of Birth: 1986-04-08  Beginning of progress report period: June 24, 2015 End of progress report period: June 30, 2015  Today's Date: 06/30/2015 PT Individual Time: 0800-0900 PT Individual Time Calculation (min): 60 min   Patient has met 5 of 5 short term goals.  Patient has made excellent progress since last reporting period and currently requires min A overall for functional mobility with use of SBQC. Patient is most limited with functional mobility due to hip flexor/ankle dorsiflexor weakness and impulsivity and requires tactile/verbal cues to decrease compensatory strategies. Hands-on family training has been initiated with wife and HEP initiated with wife and father, continue training. Planned community outing at beginning of next week with father present for continued training and community integration.  Patient continues to demonstrate the following deficits: muscle weakness, muscle joint tightness, muscle paralysis, decreased cardiorespiratory endurance, impaired timing and sequencing, abnormal tone, unbalanced muscle activation, decreased coordination, decreased visual acuity, decreased attention to right, decreased emergent > anticipatory awareness, decreased problem solving, decreased safety awareness, decreased memory, delayed processing, decreased standing balance, decreased postural control, hemiplegia, and decreased balance strategies and therefore will continue to benefit from skilled PT intervention to enhance overall performance with activity tolerance, balance, postural control, ability to compensate for deficits, functional use of  right upper extremity and right lower extremity, attention, awareness and coordination.  Patient progressing toward long term goals.  Plan of care revisions: Stair goal upgraded to supervision..  PT Short Term Goals Week 1:  PT Short Term Goal 1  (Week 1): Patient will ambulate x 100 ft using LRAD with assist of one person.  PT Short Term Goal 1 - Progress (Week 1): Met PT Short Term Goal 2 (Week 1): Patient will perform bed <> wheelchair transfers with consistent min A.  PT Short Term Goal 2 - Progress (Week 1): Met PT Short Term Goal 3 (Week 1): Patient will perform bed mobility with consistent min A and min cues for sequencing and technique.  PT Short Term Goal 3 - Progress (Week 1): Met PT Short Term Goal 4 (Week 1): Patient will negotiate up/down 12 stairs using 1 rail with mod A x 1.  PT Short Term Goal 4 - Progress (Week 1): Met PT Short Term Goal 5 (Week 1): Patient will maintain dynamic standing balance x 5 min with mod A overall.  PT Short Term Goal 5 - Progress (Week 1): Met Week 2:  PT Short Term Goal 1 (Week 2): = LTGs of overall supervision  Skilled Therapeutic Interventions/Progress Updates:   Gait using SBQC x 180 ft with supervision overall and min A for turning and with distraction in hallway (talking to staff). Patient demonstrates significant fall risk as noted by score of 39/56 on Berg Balance Scale, see details below. Patient educated on falls risk and importance of safety in room, verbalized understanding.   Neuromuscular re-ed via forced use with manual cues for postural control: static tall kneeling on mat progressed to tall kneeling squats x 20, transitional movements with small bench for LUE support - tall kneeling <> 1/2 kneeling leading with LLE, standing <> 1/2 kneeling <> tall kneeling <> side sitting <> supine using mat table for LUE support with therapist facilitating RUE weightbearing as able.   Dynamic standing balance and neuro re-ed with sidestepping to R, sidestepping to L, and backwards gait with RUE HHA and patient's LUE on therapist's shoulder. Patient ambulated back the rest  of way to room x 100 ft with min guard and left sitting in wheelchair with 1/2 lap tray on and wife present.     Therapy  Documentation Precautions:  Precautions Precautions: Fall Precaution Comments: R sided weakness Restrictions Weight Bearing Restrictions: No Pain: Pain Assessment Pain Assessment: No/denies pain Balance: Standardized Balance Assessment Standardized Balance Assessment: Berg Balance Test Berg Balance Test Sit to Stand: Able to stand without using hands and stabilize independently Standing Unsupported: Able to stand safely 2 minutes Sitting with Back Unsupported but Feet Supported on Floor or Stool: Able to sit safely and securely 2 minutes Stand to Sit: Sits safely with minimal use of hands Transfers: Able to transfer with verbal cueing and /or supervision Standing Unsupported with Eyes Closed: Able to stand 10 seconds safely Standing Ubsupported with Feet Together: Able to place feet together independently and stand for 1 minute with supervision From Standing, Reach Forward with Outstretched Arm: Can reach forward >12 cm safely (5") From Standing Position, Pick up Object from Floor: Able to pick up shoe, needs supervision From Standing Position, Turn to Look Behind Over each Shoulder: Looks behind one side only/other side shows less weight shift Turn 360 Degrees: Needs assistance while turning Standing Unsupported, Alternately Place Feet on Step/Stool: Needs assistance to keep from falling or unable to try Standing Unsupported, One Foot in Front: Able to plae foot ahead of the other independently and hold 30 seconds Standing on One Leg: Able to lift leg independently and hold equal to or more than 3 seconds Total Score: 39/56  See Function Navigator for Current Functional Status.  Therapy/Group: Individual Therapy  Carney Living A 06/30/2015, 9:00 AM

## 2015-06-30 NOTE — Progress Notes (Signed)
Speech Language Pathology Daily Session Notes  Patient Details  Name: Dakota [name] MRN: [id number] Date of Birth: [birth year]  Today's Date: 06/30/2015  Session 1 SLP Individual Time: 4098-11911030-1125 SLP Individual Time Calculation (min): 55 min  Session 2 SLP Individual Time: 4782-95621030-1125 SLP Individual Time Calculation (min): 55 min   Short Term Goals: Week 1: SLP Short Term Goal 1 (Week 1): Pt will consume presentations of his currently prescribed diet with supervision cues for use of swallowing precautions.  SLP Short Term Goal 2 (Week 1): Pt will consume trials of advanced consistencies with minimal overt s/s of aspiration and supervision cues for use of swallowing precautions over 2 targeted sessions prior to advancement.  SLP Short Term Goal 3 (Week 1): Pt will utilize memory compensatory strategies to facilitate recall of daily information wtih min assist verbal cues.   SLP Short Term Goal 4 (Week 1): Pt will complete basic to semi-complex self care and/or home management tasks with min assist verbal cues for functional problem solving.   SLP Short Term Goal 5 (Week 1): Pt will utilize increased vocal intensity and overarticulation to achieve intelligibility in conversation with supervision.   SLP Short Term Goal 6 (Week 1): Pt will recognize and correct errors in the moment during functional tasks with min assist verbal cues.    Skilled Therapeutic Interventions: Session 1 Skilled treatment session focused on addressing cognitive goals. Patient propelled wheelchair to and from session with increased time for path finding and Supervision level verbal cues for right attention and problem solvign re-positioning.   SLP also facilitated session by providing Supervision level verbal cues for use of working memory strategies during a new learning task and Min verbal cues to identify non-obvious solutions during task completion.  Continue with current plan of care.   Session 2 Skilled treatment  session focused on addressing speech-language goals. SLP facilitated session by providing Supervision level assist verbal cue x1 during a moment of anomia in a structured conversational task.  Patient also required Supervision level question cues for use of over articulation to maximize overall speech intelligibility during previously mentioned task, which was in a quite environment.  Continue to address goals in noisy and more distracting environments for carryover.    Function:  Cognition Comprehension Comprehension assist level: Understands complex 90% of the time/cues 10% of the time  Expression   Expression assist level: Expresses complex 90% of the time/cues < 10% of the time  Social Interaction Social Interaction assist level: Interacts appropriately 75 - 89% of the time - Needs redirection for appropriate language or to initiate interaction.  Problem Solving Problem solving assist level: Solves basic 90% of the time/requires cueing < 10% of the time  Memory Memory assist level: Recognizes or recalls 90% of the time/requires cueing < 10% of the time    Pain Pain Assessment Pain Assessment: No/denies pain Pain Score: 0-No pain x2  Therapy/Group: Individual Therapy x2  Dakota FerrettiMelissa Katiejo [name], M.A., CCC-SLP [phone]  Dakota [name] 06/30/2015, 3:52 PM

## 2015-06-30 NOTE — Progress Notes (Signed)
Occupational Therapy Session Note  Patient Details  Name: Pieter PartridgeJessie Matsumura MRN: [id number] Date of Birth: 1986-02-08  Today's Date: 06/30/2015 OT Individual Time: 0900-1000 OT Individual Time Calculation (min): 60 min    Short Term Goals: Week 1:  OT Short Term Goal 1 (Week 1): Pt will transfer to the toilet with min A using a squat pivot transfer. OT Short Term Goal 2 (Week 1): Pt will complete toileting with steadying A. OT Short Term Goal 3 (Week 1): Pt will don shirt with set up. OT Short Term Goal 4 (Week 1): Pt will dress LB with min A. OT Short Term Goal 5 (Week 1): Pt will complete RUE self ROM ex with min cues.  Skilled Therapeutic Interventions/Progress Updates:    Pt engaged in BADL retraining including bathing at shower level and dressing with sit<>stand from w/c at sink.  Pt noted with functional carryover of bathing and dressing strategies from previous therapy sessions.  Pt required only minimal assistance and instructional verbal cues for donning AFO this morning.  Pt still noted with impulsive behaviors primarily with sit<>stand.  Educated pt and wife on importance of communication and safety.  Pt and wife verbalized understanding.  Focus on activity tolerance, functional transfers, sit<>stand, standing balance, and safety awareness.  Therapy Documentation Precautions:  Precautions Precautions: Fall Precaution Comments: R sided weakness Restrictions Weight Bearing Restrictions: No   Pain: Pain Assessment Pain Assessment: No/denies pain  See Function Navigator for Current Functional Status.   Therapy/Group: Individual Therapy  [name], Aubriella Perezgarcia Chappell 06/30/2015, 12:09 PM

## 2015-06-30 NOTE — Progress Notes (Signed)
[facility] PHYSICAL MEDICINE & REHABILITATION     PROGRESS NOTE    Subjective/Complaints: Pt seen sleeping with wife in bed this AM.  Pt states he and his wife do not move in their sleep, so it does not disturb his rest.  He is doing well in therapies.    ROS: Pt denies CP, SOB, n/v/d.   Objective: Vital Signs: Blood pressure 106/63, pulse 73, temperature 98.5 F (36.9 C), temperature source Oral, resp. rate 17, weight 83.643 kg (184 lb 6.4 oz), SpO2 98 %. No results found. No results for input(s): WBC, HGB, HCT, PLT in the last 72 hours. No results for input(s): NA, K, CL, GLUCOSE, BUN, CREATININE, CALCIUM in the last 72 hours.  Invalid input(s): CO CBG (last 3)  No results for input(s): GLUCAP in the last 72 hours.  Wt Readings from Last 3 Encounters:  06/29/15 83.643 kg (184 lb 6.4 oz)  06/20/15 91.7 kg (202 lb 2.6 oz)    Physical Exam:  Constitutional: He appears well-developed and well-nourished. Flat affect. Alert.    HENT:  Head: Normocephalic. Left eye and face trauma Eyes: Right eye exhibits no discharge.  Left ptosis. Right - normal in appearance  Neck: Normal range of motion. Neck supple.  Cardiovascular: Normal rate and regular rhythm.  No murmur heard. Respiratory: Effort normal and breath sounds normal. No respiratory distress. He has no wheezes.  GI: Soft. Bowel sounds are normal. He exhibits no distension. There is no tenderness. No rebound Musculoskeletal: He exhibits tenderness.  Minimal edema in LUE.  Neurological: He is alert and oriented.  He is unable to lift his left eye lid.  Right facial weakness with mild to moderate dysarthria and right tongue deviation. Dense right hemiparesis of the UE: 2-/5 shoulder, 0/5 distal. RLE: still with extensor tone, grossly 1-2/5 HF,KE, ADF/APF but inconsistent. Lacks insight and awareness of deficits. Sensation in LUE improving.   5/5 on the left upper and left lower extremity Psychiatric: His affect is  blunt. His speech is delayed and slurred. He is slowed.   Assessment/Plan: 1. [facility] which require 3+ hours per day of interdisciplinary therapy in a comprehensive inpatient rehab setting. Physiatrist is providing close team supervision and 24 hour management of active medical problems listed below. Physiatrist and rehab team continue to assess barriers to discharge/monitor patient progress toward functional and medical goals.  Function:  Bathing Bathing position Bathing activity did not occur: Refused Position: Systems developerhower  Bathing parts Body parts bathed by patient: Right arm, Chest, Abdomen, Front perineal area, Right upper leg, Left upper leg, Right lower leg, Left lower leg, Buttocks, Left arm Body parts bathed by helper: Left arm, Back  Bathing assist Assist Level: Touching or steadying assistance(Pt > 75%)      Upper Body Dressing/Undressing Upper body dressing Upper body dressing/undressing activity did not occur: Refused What is the patient wearing?: Pull over shirt/dress     Pull over shirt/dress - Perfomed by patient: Thread/unthread left sleeve, Put head through opening, Pull shirt over trunk, Thread/unthread right sleeve Pull over shirt/dress - Perfomed by helper: Thread/unthread right sleeve        Upper body assist Assist Level: Supervision or verbal cues      Lower Body Dressing/Undressing Lower body dressing Lower body dressing/undressing activity did not occur: Refused What is the patient wearing?: Underwear, Pants, Socks, Shoes, AFO Underwear - Performed by patient: Thread/unthread left underwear leg, Pull underwear up/down, Thread/unthread right underwear leg Underwear - Performed  by helper: Thread/unthread right underwear leg Pants- Performed by patient: Thread/unthread left pants leg, Pull pants up/down, Thread/unthread right pants leg Pants- Performed by helper: Thread/unthread right pants leg Non-skid slipper socks-  Performed by patient: Don/doff left sock Non-skid slipper socks- Performed by helper: Don/doff right sock Socks - Performed by patient: Don/doff right sock, Don/doff left sock Socks - Performed by helper: Don/doff right sock Shoes - Performed by patient: Don/doff left shoe, Fasten left Shoes - Performed by helper: Don/doff right shoe, Fasten right   AFO - Performed by helper: Don/doff right AFO      Lower body assist Assist for lower body dressing: Touching or steadying assistance (Pt > 75%)      Toileting Toileting     Toileting steps completed by helper: Adjust clothing prior to toileting, Performs perineal hygiene, Adjust clothing after toileting Toileting Assistive Devices: Grab bar or rail  Toileting assist Assist level: No help/no cues   Transfers Chair/bed transfer   Chair/bed transfer method: Stand pivot Chair/bed transfer assist level: Touching or steadying assistance (Pt > 75%) Chair/bed transfer assistive device: Hospital doctor     Max distance: 180 ft Assist level: Touching or steadying assistance (Pt > 75%)   Wheelchair   Type: Manual Max wheelchair distance: 150 Assist Level: Supervision or verbal cues  Cognition Comprehension Comprehension assist level: Understands basic 90% of the time/cues < 10% of the time  Expression Expression assist level: Expresses basic 75 - 89% of the time/requires cueing 10 - 24% of the time. Needs helper to occlude trach/needs to repeat words.  Social Interaction Social Interaction assist level: Interacts appropriately 90% of the time - Needs monitoring or encouragement for participation or interaction.  Problem Solving Problem solving assist level: Solves basic 75 - 89% of the time/requires cueing 10 - 24% of the time  Memory Memory assist level: Recognizes or recalls 75 - 89% of the time/requires cueing 10 - 24% of the time   Medical Problem List and Plan: 1. 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 DVT Prophylaxis/Anticoagulation: Mechanical: Sequential compression devices, below knee Bilateral lower extremities -dopplers -negative 3. Pain Management: Will continue oxycodone prn. encouraged use as needed.   -robaxin prn for spasms - controlled 4. Mood: Monitor as mentation improves. LCSW to follow with patient for evaluation and support.  5. Neuropsych: This patient is not capable of making decisions on his own behalf. 6. Skin/Wound Care: Routine pressure relief measures. Monitor L-eye for resolution of edema.  7. Fluids/Electrolytes/Nutrition: Monitor I/O. continue nutritional supplements   -Sodium 134 10/14. 8. Constipation:  Senna scheduled. 9. Urinary retention:Urecholine increased to qid. Continue Flomax. Emptying completely now,   10. Left retrobulbar hemorrhage with RAPD: continue observation and conservative care. Pain appears to be under control.   11. Spasticity: Per discussion and coordination with therapies, tone in RLE improved with AFO and is functional at present.  Will not add medications for the time being and continue to monitor as well as encourage ROM.    LOS (Days) 7 A FACE TO FACE EVALUATION WAS PERFORMED  Ankit Karis Juba 06/30/2015 8:05 AM

## 2015-06-30 NOTE — Progress Notes (Signed)
Orthopedic Tech Progress Note Patient Details:  Dakota PartridgeJessie [name] Jan 20, 1986 [id number]  Patient ID: Dakota [name], male   DOB: Jan 20, 1986, 29 y.o.   MRN: [id number] Called in advanced brace order; spoke with Ferdinand LangoKanisha  [name] 06/30/2015, 1:30 PM

## 2015-06-30 NOTE — Progress Notes (Signed)
Occupational Therapy Note  Patient Details  Name: Dakota PartridgeJessie [name] MRN: [id number] Date of Birth: [birth year]  Today's Date: 06/30/2015 OT Individual Time: 1300-1355 OT Individual Time Calculation (min): 55 min   Pt denied pain Individual therapy  Pt resting in w/c with father present.  Pt propelled to therapy gym and initially engaged in weight bearing through RUE while in tall kneeling.  Pt transitioned to RUE therapeutic activities, AROM, and AAROM.  Pt also engaged in active scapula/shoulder elevation and retraction activities.  Pt noted with increased unassisted shoulder elevation today and increased scapula retraction.  Pt instructed on shoulder shrug exercises to perform when in room.  Pt and father verbalized understanding.   Lavone NeriLanier, Hobie Kohles [facility] 06/30/2015, 2:00 PM

## 2015-07-01 ENCOUNTER — Inpatient Hospital Stay (HOSPITAL_COMMUNITY): Payer: 59 | Admitting: Speech Pathology

## 2015-07-01 ENCOUNTER — Inpatient Hospital Stay (HOSPITAL_COMMUNITY): Payer: 59

## 2015-07-01 ENCOUNTER — Inpatient Hospital Stay (HOSPITAL_COMMUNITY): Payer: 59 | Admitting: Physical Therapy

## 2015-07-01 NOTE — Progress Notes (Signed)
Occupational Therapy Weekly Progress Note  Patient Details  Name: Dakota [name] MRN: [id number] Date of Birth: July 27, 1986  Beginning of progress report period: June 24, 2015 End of progress report period: July 01, 2015  Patient has met 5 of 5 short term goals.  Pt has made steady progress with BADL retraining since admission.  Pt currently requires close supervision/steady A for bathing and dressing tasks.  Pt performs sit<>stand with close supervision and requires steady A for LB dressing tasks.  Pt continues to require min verbal cues for safety awareness and RUE/RLE positioning and placement.  Trace muscle activity has been noted in RUE in anterior deltoids and upper traps.  Pt noted with scapula elevation during shoulder shrugs and retraction with mobilization. LTGs have been updated to mod I for toilet transfers/toileting, UB dressing tasks, grooming, and bathing.  Pt exhibits behaviors consistent with [place] [facility].  Patient continues to demonstrate the following deficits: decreased safety awareness, RUE hemiplegia, decreased dynamic standing balance and therefore will continue to benefit from skilled OT intervention to enhance overall performance with BADL and iADL.  Patient progressing toward long term goals..  Plan of care revisions: LTGs of grooming, eating, toileting, toilet transfers, bathing, and UB dressing upgraded to mod I.  .  OT Short Term Goals Week 1:  OT Short Term Goal 1 (Week 1): Pt will transfer to the toilet with min A using a squat pivot transfer. OT Short Term Goal 1 - Progress (Week 1): Met OT Short Term Goal 2 (Week 1): Pt will complete toileting with steadying A. OT Short Term Goal 2 - Progress (Week 1): Met OT Short Term Goal 3 (Week 1): Pt will don shirt with set up. OT Short Term Goal 3 - Progress (Week 1): Met OT Short Term Goal 4 (Week 1): Pt will dress LB with min A. OT Short Term Goal 4 - Progress (Week 1): Met OT Short Term Goal 5 (Week 1): Pt  will complete RUE self ROM ex with min cues. OT Short Term Goal 5 - Progress (Week 1): Met Week 2:  OT Short Term Goal 1 (Week 2): Pt will perform tub/shower transfer with supervision OT Short Term Goal 2 (Week 2): Pt will perform LB dressing tasks with supervision OT Short Term Goal 3 (Week 2): Pt wil perform UB dressing tasks with mod I OT Short Term Goal 4 (Week 2): Pt will perform toileting tasks with mod I       Therapy Documentation Precautions:  Precautions Precautions: Fall Precaution Comments: R sided weakness Restrictions Weight Bearing Restrictions: No Pain: Pain Assessment Pain Assessment: No/denies pain  See Function Navigator for Current Functional Status.     [name] [facility] 07/01/2015, 6:59 AM

## 2015-07-01 NOTE — Progress Notes (Signed)
Physical Therapy Session Note  Patient Details  Name: Dakota [name] MRN: [id number] Date of Birth: 02-19-1986  Today's Date: 07/01/2015 PT Individual Time: 1530-1610 PT Individual Time Calculation (min): 40 min   Short Term Goals: Week 2:  PT Short Term Goal 1 (Week 2): = LTGs of overall supervision  Skilled Therapeutic Interventions/Progress Updates:    Pt received up in w/c with wife present entire session and father present 2nd half of session. Gait Training - PT continued family training with wife and instructed her in hand placement: one hand on pt's far hip and other hand on his near shoulder - wife reports pt req steadying assist. Pt req repeated cues to slow down and to provide increased room on R side for wife to not run into obstacles or doorframe. Increased R knee hyperextension with fatigue - PLS AFO worn during treatment. PT instructs pt on 3" and 6" stairs with one rail - focus on motor control of R leg during backwards descent - abducting leg for increased base of support stability and "hitting" riser of step in front in order to make sure full/almost full plantar foot contact on step. This activity progressed to ascending 4 6" step side, turning around, and descending forward - all req steadying assist. Neuromuscular Reeducation - PT instructs pt in R LE NMR: step-ups on 3" progressing to 6" step - focus on activating R quad control and preventing R knee hyperextension x 10 reps on each height step with L rail support. PT introduces pt and family to mirror visual feedback therapy for R UE motor control return, sets up large mirror box on bed with pt rolled up to foot of bed in w/c (footboard removed) and instructs pt, wife, and father in assembling mirror box and instructing pt in correct technique of exercises: fist pumps, wrist extension, hands together touching mirror, finger tapping on mirror. PT instructs family to do this for 30 minutes nightly with pt, to ensure R weak limb is  fully covered from eye contact, and the ensure pt focuses vision on mirror reflection while trying to attempt to move R arm while watching visual reflection move. Pt ended in this position with family thanking PT. Continue per PT POC.   Therapy Documentation Precautions:  Precautions Precautions: Fall Precaution Comments: R sided weakness Restrictions Weight Bearing Restrictions: No Pain: Pain Assessment Pain Assessment: No/denies pain   See Function Navigator for Current Functional Status.   Therapy/Group: Individual Therapy  Hayly Litsey M 07/01/2015, 4:17 PM

## 2015-07-01 NOTE — Progress Notes (Signed)
Physical Therapy Note  Patient Details  Name: Dakota [name] MRN: [id number] Date of Birth: 1985/10/23 Today's Date: 07/01/2015    Time: 1300-1355 55 minutes  1:1 No c/o pain.  Pt performed gait training 200' on level surface with PLS AFO and SBQC with min assist.  Pt states the AFO feels "lighter" and that it makes him feel he can move better.  No toe drag noted with use of AFO.  Gait training with obstacle negotiation with verbal cues for attention to obstacles and for safety.  Side and backwards stepping through obstacles with min A, cues for safety.  Gait on carpet with funcitonal tasks, picking objects up from floor with min A, no LOB with squatting to reach floor level objects.  Quadruped for R UE activation with mod A for R UE, pt able to tolerate gentle wt shifts 3 minutes before fatigued.  Pt with good motivation and family support.   Dakota [name]  07/01/2015, 1:55 PM

## 2015-07-01 NOTE — Progress Notes (Signed)
Physical Therapy Session Note  Patient Details  Name: Dakota [name] MRN: [id number] Date of Birth: 12/05/1985  Today's Date: 07/01/2015 PT Individual Time: 1030-1100 PT Individual Time Calculation (min): 30 min   Short Term Goals: Week 1:  PT Short Term Goal 1 (Week 1): Patient will ambulate x 100 ft using LRAD with assist of one person.  PT Short Term Goal 1 - Progress (Week 1): Met PT Short Term Goal 2 (Week 1): Patient will perform bed <> wheelchair transfers with consistent min A.  PT Short Term Goal 2 - Progress (Week 1): Met PT Short Term Goal 3 (Week 1): Patient will perform bed mobility with consistent min A and min cues for sequencing and technique.  PT Short Term Goal 3 - Progress (Week 1): Met PT Short Term Goal 4 (Week 1): Patient will negotiate up/down 12 stairs using 1 rail with mod A x 1.  PT Short Term Goal 4 - Progress (Week 1): Met PT Short Term Goal 5 (Week 1): Patient will maintain dynamic standing balance x 5 min with mod A overall.  PT Short Term Goal 5 - Progress (Week 1): Met Week 2:  PT Short Term Goal 1 (Week 2): = LTGs of overall supervision  Skilled Therapeutic Interventions/Progress Updates:   Pt received in w/c with wife present; pt educated on use of Bioness for RLE DF assist.  Pt agreeable to try Bioness on RLE.  Pt transported to gym in w/c supervision with hemi-technique for propulsion.  Pt set up with Bioness on R lower leg for DF assist.  Performed NMR with Bioness; see below for details.  Returned to room in w/c and left with wife with all items within reach.  Therapy Documentation Precautions:  Precautions Precautions: Fall Precaution Comments: R sided weakness Restrictions Weight Bearing Restrictions: No Pain: Pain Assessment Pain Assessment: No/denies pain Other Treatments: Treatments Neuromuscular Facilitation: Right;Lower Extremity;Activity to increase timing and sequencing;Activity to increase motor control;Activity to increase sustained  activation with Bioness on R lower LE for DF assist and R knee control in initial stance during gait x 75' x 2 with SPC and min A overall with continued facilitation and verbal cues for pelvic rotation, weight shifting, RLE flexion during swing, and heel strike at initial stance.  With Bioness pt experienced minimal DF and foot clearance; recommending continued training with AFO and re-attempt Bioness as pt progresses and demonstrates increased activation in RLE.   See Function Navigator for Current Functional Status.   Therapy/Group: Individual Therapy  Hall, Audra Faucette 07/01/2015, 7:53 PM  

## 2015-07-01 NOTE — Progress Notes (Signed)
Occupational Therapy Session Note  Patient Details  Name: Dakota PartridgeJessie [name] MRN: [id number] Date of Birth: [birth year]  Today's Date: 07/01/2015 OT Individual Time: 1400-1430 OT Individual Time Calculation (min): 30 min   Short Term Goals: Week 2:  OT Short Term Goal 1 (Week 2): Pt will perform tub/shower transfer with supervision OT Short Term Goal 2 (Week 2): Pt will perform LB dressing tasks with supervision OT Short Term Goal 3 (Week 2): Pt wil perform UB dressing tasks with mod I OT Short Term Goal 4 (Week 2): Pt will perform toileting tasks with mod I  Skilled Therapeutic Interventions/Progress Updates: Therapeutic activity with focus on NMES of RUE, open extension exercise routine, duration set at 5, intensity to 10.   Pt tolerates treatment well after introduction and initial fitting with equipment, extensor pad B, flexor pad B.   Family present during session and educated on intent of treatment and plan for progression to flexion routine, as feasible.  Therapy Documentation Precautions:  Precautions Precautions: Fall Precaution Comments: R sided weakness Restrictions Weight Bearing Restrictions: No  Vital Signs: Therapy Vitals Temp: 98.2 F (36.8 C) Temp Source: Oral Pulse Rate: 100 Resp: 18 BP: 118/67 mmHg Patient Position (if appropriate): Sitting Oxygen Therapy SpO2: 98 % O2 Device: Not Delivered   Pain: Pain Assessment Pain Assessment: No/denies pain  See Function Navigator for Current Functional Status.   Therapy/Group: Individual Therapy  Aliya Sol 07/01/2015, 2:58 PM

## 2015-07-01 NOTE — Progress Notes (Signed)
[facility]     PROGRESS NOTE    Subjective/Complaints: Pt seen sleeping with wife in bed this AM.  Patient states his double overnight and is pleased with how therapies are going. He still notes limited use of his right upper extremity.  ROS: Pt denies CP, SOB, n/v/d.   Objective: Vital Signs: Blood pressure 106/61, pulse 65, temperature 98.7 F (37.1 C), temperature source Oral, resp. rate 14, weight 83.643 kg (184 lb 6.4 oz), SpO2 96 %. No results found. No results for input(s): WBC, HGB, HCT, PLT in the last 72 hours. No results for input(s): NA, K, CL, GLUCOSE, BUN, CREATININE, CALCIUM in the last 72 hours.  Invalid input(s): CO CBG (last 3)  No results for input(s): GLUCAP in the last 72 hours.  Wt Readings from Last 3 Encounters:  06/29/15 83.643 kg (184 lb 6.4 oz)  06/20/15 91.7 kg (202 lb 2.6 oz)   upper extremity.  Physical Exam:  Constitutional: He appears well-developed and well-nourished. Flat affect. Alert.    HENT:  Head: Normocephalic. Left eye and face trauma Eyes: Right eye exhibits no discharge.  Left ptosis. Right - normal in appearance  Neck: Normal range of motion. Neck supple.  Cardiovascular: Normal rate and regular rhythm.  No murmur heard. Respiratory: Effort normal and breath sounds normal. No respiratory distress. He has no wheezes.  GI: Soft. Bowel sounds are normal. He exhibits no distension. There is no tenderness. No rebound Musculoskeletal: He exhibits tenderness.  Minimal edema in LUE.  Neurological: He is alert and oriented.  He is unable to lift his left eye lid.  Right facial weakness with mild to moderate dysarthria and right tongue deviation. Dense right hemiparesis of the UE: 2-/5 shoulder, 0/5 distal. RLE: still with extensor tone, grossly 1-2/5 HF,KE, ADF/APF but inconsistent. Lacks insight and awareness of deficits. Sensation in LUE improving.   5/5 on the left upper and left lower  extremity Psychiatric: His affect is blunt. His speech is delayed and slurred. He is slowed.   Assessment/Plan: 1. Functional deficits secondary [facility] which require 3+ hours per day of interdisciplinary therapy in a comprehensive inpatient rehab setting. Physiatrist is providing close team supervision and 24 hour management of active medical problems listed below. Physiatrist and rehab team continue to assess barriers to discharge/monitor patient progress toward functional and medical goals.  Function:  Bathing Bathing position Bathing activity did not occur: Refused Position: Systems developerhower  Bathing parts Body parts bathed by patient: Right arm, Chest, Abdomen, Front perineal area, Right upper leg, Left upper leg, Right lower leg, Left lower leg, Buttocks, Left arm Body parts bathed by helper: Back  Bathing assist Assist Level: Touching or steadying assistance(Pt > 75%)      Upper Body Dressing/Undressing Upper body dressing Upper body dressing/undressing activity did not occur: Refused What is the patient wearing?: Pull over shirt/dress     Pull over shirt/dress - Perfomed by patient: Thread/unthread left sleeve, Put head through opening, Pull shirt over trunk, Thread/unthread right sleeve Pull over shirt/dress - Perfomed by helper: Thread/unthread right sleeve        Upper body assist Assist Level: Supervision or verbal cues      Lower Body Dressing/Undressing Lower body dressing Lower body dressing/undressing activity did not occur: Refused What is the patient wearing?: Underwear, Pants, Socks, Shoes, AFO Underwear - Performed by patient: Thread/unthread left underwear leg, Pull underwear up/down, Thread/unthread right underwear leg Underwear - Performed by helper: Thread/unthread right underwear  leg Pants- Performed by patient: Thread/unthread left pants leg, Pull pants up/down, Thread/unthread right pants leg Pants- Performed by helper: Thread/unthread right pants  leg Non-skid slipper socks- Performed by patient: Don/doff left sock Non-skid slipper socks- Performed by helper: Don/doff right sock Socks - Performed by patient: Don/doff right sock, Don/doff left sock Socks - Performed by helper: Don/doff right sock Shoes - Performed by patient: Don/doff right shoe, Don/doff left shoe, Fasten right, Fasten left Shoes - Performed by helper: Don/doff right shoe, Fasten right   AFO - Performed by helper: Don/doff right AFO      Lower body assist Assist for lower body dressing: Touching or steadying assistance (Pt > 75%)      Toileting Toileting     Toileting steps completed by helper: Adjust clothing prior to toileting, Performs perineal hygiene, Adjust clothing after toileting Toileting Assistive Devices: Grab bar or rail  Toileting assist Assist level: No help/no cues   Transfers Chair/bed transfer   Chair/bed transfer method: Stand pivot Chair/bed transfer assist level: Touching or steadying assistance (Pt > 75%) Chair/bed transfer assistive device: Hospital doctor     Max distance: 180 ft Assist level: Touching or steadying assistance (Pt > 75%)   Wheelchair   Type: Manual Max wheelchair distance: 150 Assist Level: Supervision or verbal cues  Cognition Comprehension Comprehension assist level: Understands complex 90% of the time/cues 10% of the time  Expression Expression assist level: Expresses basic needs/ideas: With extra time/assistive device  Social Interaction Social Interaction assist level: Interacts appropriately 75 - 89% of the time - Needs redirection for appropriate language or to initiate interaction.  Problem Solving Problem solving assist level: Solves basic 90% of the time/requires cueing < 10% of the time  Memory Memory assist level: Recognizes or recalls 90% of the time/requires cueing < 10% of the time   Medical Problem List and Plan: 1. 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 RIght  hemiparesis dense in UE , emerging synergy RLE  -Cont CIR therapies 2. DVT Prophylaxis/Anticoagulation: Mechanical: Sequential compression devices, below knee Bilateral lower extremities -dopplers -negative 3. Pain Management: Will continue oxycodone prn. encouraged use as needed.   -robaxin prn for spasms - controlled 4. Mood: Monitor as mentation improves. LCSW to follow with patient for evaluation and support.  5. Neuropsych: This patient is not capable of making decisions on his own behalf. 6. Skin/Wound Care: Routine pressure relief measures. Monitor L-eye for resolution of edema.  7. Fluids/Electrolytes/Nutrition: Monitor I/O. continue nutritional supplements   -Sodium 134 10/14. 8. Constipation:  Senna scheduled.  Per nursing, last BM 10/20.   9. Urinary retention:Urecholine increased to qid. Continue Flomax. Emptying completely now,   10. Left retrobulbar hemorrhage with RAPD: continue observation and conservative care. Pain appears to be under control.   11. Spasticity: Per discussion and coordination with therapies, tone in RLE improved with AFO and is functional at present.  Will not add medications for the time being and continue to monitor as well as encourage ROM.    LOS (Days) 8 A FACE TO FACE EVALUATION WAS PERFORMED  Farrie Sann Karis Juba 07/01/2015 8:37 AM

## 2015-07-01 NOTE — Progress Notes (Signed)
Occupational Therapy Session Note  Patient Details  Name: Dakota [name] MRN: [id number] Date of Birth: 16-Nov-1985  Today's Date: 07/01/2015 OT Individual Time: 1100-1200 OT Individual Time Calculation (min): 60 min    Short Term Goals: Week 2:  OT Short Term Goal 1 (Week 2): Pt will perform tub/shower transfer with supervision OT Short Term Goal 2 (Week 2): Pt will perform LB dressing tasks with supervision OT Short Term Goal 3 (Week 2): Pt wil perform UB dressing tasks with mod I OT Short Term Goal 4 (Week 2): Pt will perform toileting tasks with mod I  Skilled Therapeutic Interventions/Progress Updates:    Pt engaged in BADL retraining including bathing at shower level and dressing with sit<>stand from w/c at sink.  Pt continues to exhibit carryover of previously learned bathing and dressing strategies.  Pt continues to require min verbal cues for safety awareness-locking w/c brakes and attending to RLE when not placed on foot rest.  Pt continues to require assistance with donning R shoe/AFO. Focus on safety awareness, standing balance, sit<>stand, BADL retraining, family education, activity tolerance, and attention to RUE/RLE.  Therapy Documentation Precautions:  Precautions Precautions: Fall Precaution Comments: R sided weakness Restrictions Weight Bearing Restrictions: No    Pain: Pain Assessment Pain Assessment: No/denies pain  See Function Navigator for Current Functional Status.   Therapy/Group: Individual Therapy  [name], [name] 07/01/2015, 12:12 PM

## 2015-07-01 NOTE — Progress Notes (Signed)
Speech Language Pathology Weekly Progress and Session Note  Patient Details  Name: Dakota [name] MRN: [id number] Date of Birth: [birth year]  Beginning of progress report period: June 24, 2015 End of progress report period: July 01, 2015  Today's Date: 07/01/2015 SLP Individual Time: 0930-1030 SLP Individual Time Calculation (min): 60 min  Short Term Goals: Week 1: SLP Short Term Goal 1 (Week 1): Pt will consume presentations of his currently prescribed diet with supervision cues for use of swallowing precautions.  SLP Short Term Goal 1 - Progress (Week 1): Met SLP Short Term Goal 2 (Week 1): Pt will consume trials of advanced consistencies with minimal overt s/s of aspiration and supervision cues for use of swallowing precautions over 2 targeted sessions prior to advancement.  SLP Short Term Goal 2 - Progress (Week 1): Met SLP Short Term Goal 3 (Week 1): Pt will utilize memory compensatory strategies to facilitate recall of daily information wtih min assist verbal cues.   SLP Short Term Goal 3 - Progress (Week 1): Met SLP Short Term Goal 4 (Week 1): Pt will complete basic to semi-complex self care and/or home management tasks with min assist verbal cues for functional problem solving.   SLP Short Term Goal 4 - Progress (Week 1): Met SLP Short Term Goal 5 (Week 1): Pt will utilize increased vocal intensity and overarticulation to achieve intelligibility in conversation with supervision.   SLP Short Term Goal 5 - Progress (Week 1): Met SLP Short Term Goal 6 (Week 1): Pt will recognize and correct errors in the moment during functional tasks with min assist verbal cues.   SLP Short Term Goal 6 - Progress (Week 1): Met    New Short Term Goals: Week 2: SLP Short Term Goal 1 (Week 2): Pt will consume regular textures and thin liquids with Mod I use of portion control and pacing with minimal overt s/s of aspiration.  SLP Short Term Goal 2 (Week 2): Pt will utilize memory compensatory  strategies to facilitate recall of daily information wtih Mod I.   SLP Short Term Goal 3 (Week 2): Pt will complete semi-complex self care and/or home management tasks with Supervision level verbal cues for self-monitoring and correcting of errors.   SLP Short Term Goal 4 (Week 2): Pt will utilize word finding strategies during unstructured conversations with increased time. SLP Short Term Goal 5 (Week 2): Pt will utilize increased vocal intensity and overarticulation to achieve 95% intelligibility at the conversation level.    Weekly Progress Updates: Patient has made functional gains and has met 6 out of 6 short term goals this reporting period due to improved swallow function, speech intelligibility and cognitive-linguistic skills. Currently, patient continues to require Min assist for recall, semi-complex problem solving, use of speech intelligibility and word finding strategies.  Patient also requires Supervision cues for portion control and pacing while consuming regular textures and thin liquids, which reduces overt s/s of aspiration.  Patient and family education is ongoing at this time. Patient would benefit from continued skilled SLP intervention to maximize functional independence and to maximize their functional independence prior to discharge with 24 hour supervision.    Intensity: Minumum of 1-2 x/day, 30 to 90 minutes Frequency: 1 to 3 out of 7 days Duration/Length of Stay: 14-21 days  Treatment/Interventions: Dysphagia/aspiration precaution training;Cognitive remediation/compensation;Internal/external aids;Environmental controls;Cueing hierarchy;Functional tasks;Patient/family education;Therapeutic Activities   Daily Session  Skilled Therapeutic Interventions:    Skilled treatment session focused on addressing cognitive goals. SLP facilitated session by providing set-up assist with  a novel, complex task and Supervision level verbal cues to identify and correct errors during 3-D  puzzle construction.  Patient's wife was present during session which positively impacted his initiation and social interaction as well as elicited more natural and active right sided labial movements with genuine smiles and laughter.  Patient with no instances of word finding; however, continues to report them intermittently.  Wife and patient included to discussion regarding goal progress and creation; as well as modification to the treatment plan to SLP frequency 3x/week due to overall progress.     Function:   Eating Eating   Modified Consistency Diet: Yes Eating Assist Level: More than reasonable amount of time;Set up assist for   Eating Set Up Assist For: Opening containers       Cognition Comprehension Comprehension assist level: Understands complex 90% of the time/cues 10% of the time  Expression   Expression assist level: Expresses complex 90% of the time/cues < 10% of the time  Social Interaction Social Interaction assist level: Interacts appropriately 90% of the time - Needs monitoring or encouragement for participation or interaction.  Problem Solving Problem solving assist level: Solves complex 90% of the time/cues < 10% of the time;Solves basic 90% of the time/requires cueing < 10% of the time  Memory Memory assist level: Requires cues to use assistive device   General    Pain Pain Assessment Pain Assessment: No/denies pain  Therapy/Group: Individual Therapy  Carmelia Roller., CCC-SLP [phone]  [place] 07/01/2015, 11:36 AM

## 2015-07-01 NOTE — Progress Notes (Signed)
Pt voided 700cc clear, yellow urine in urinal this morning with a PVR of 14cc.  Will continue to monitor.

## 2015-07-02 ENCOUNTER — Inpatient Hospital Stay (HOSPITAL_COMMUNITY): Payer: 59 | Admitting: Physical Therapy

## 2015-07-02 NOTE — Plan of Care (Signed)
Problem: RH Eating Goal: LTG Patient will perform eating w/assist, cues/equip (OT) LTG: Patient will perform eating with assist, with/without cues using equipment (OT)  LTG upgraded to mod I.  Problem: RH Grooming Goal: LTG Patient will perform grooming w/assist,cues/equip (OT) LTG: Patient will perform grooming with assist, with/without cues using equipment (OT)  LTG upgraded to mod I.  Problem: RH Bathing Goal: LTG Patient will bathe with assist, cues/equipment (OT) LTG: Patient will bathe specified number of body parts with assist with/without cues using equipment (position) (OT)  LTG upgraded to mod I.  Problem: RH Dressing Goal: LTG Patient will perform upper body dressing (OT) LTG Patient will perform upper body dressing with assist, with/without cues (OT).  LTG upgraded to mod I.  Problem: RH Toileting Goal: LTG Patient will perform toileting w/assist, cues/equip (OT) LTG: Patient will perform toiletiing (clothes management/hygiene) with assist, with/without cues using equipment (OT)  LTG upgraded to mod I.  Problem: RH Toilet Transfers Goal: LTG Patient will perform toilet transfers w/assist (OT) LTG: Patient will perform toilet transfers with assist, with/without cues using equipment (OT)  LTG upgraded to mod I.

## 2015-07-02 NOTE — Progress Notes (Addendum)
Physical Therapy Session Note  Patient Details  Name: Dakota PartridgeJessie [name] MRN: [id number] Date of Birth: 09/15/1985  Today's Date: 07/03/2015 PT Individual Time: 1415-1440 PT Individual Time Calculation (min): 25 min   Short Term Goals: Week 2:  PT Short Term Goal 1 (Week 2): = LTGs of overall supervision  Skilled Therapeutic Interventions/Progress Updates:   Pt received in w/c with wife present.  Continued family education with stand pivot transfer training with cane w/c <> simulated mid-size SUV x 2 reps with wife performing min A; after first attempt wife educated on safe hand placement at trunk or hips and not holding to hemiplegia UE due to risk of subluxation.  Second attempt wife demonstrated improved and proper hand placement for guarding.  Reviewed floor > furniture transfer and indications for calling EMS.  Pt return demonstrated floor > mat transfer with wife providing min A to position RUE.  Performed gait training x 150' back to room without AD with therapist providing mod A at trunk for pelvic rotation, weight shifting laterally and forwards over RLE in stance; as pt fatigued he experienced increased R genu recurvatum.  Heel wedge placed under AFO; will trial tomorrow with gait.  Wife signed off on safety sheet to perform stand pivot transfers with pt bed <> w/c <> toilet with shoes or grip socks donned; not cleared to ambulate yet.  Pt left in w/c with wife.    Therapy Documentation Precautions:  Precautions Precautions: Fall Precaution Comments: R sided weakness Restrictions Weight Bearing Restrictions: No General:   Vital Signs: Therapy Vitals Temp: 99.4 F (37.4 C) Temp Source: Oral Pulse Rate: (!) 111 Resp: 16 BP: 121/61 mmHg Patient Position (if appropriate): Sitting Oxygen Therapy SpO2: 96 % O2 Device: Not Delivered Pain:  Headache; premedicated  See Function Navigator for Current Functional Status.   Therapy/Group: Individual Therapy  Edman CircleHall, Zayaan Kozak  [facility] 07/03/2015, 4:02 PM

## 2015-07-02 NOTE — Progress Notes (Signed)
[facility] PHYSICAL MEDICINE & REHABILITATION     PROGRESS NOTE    Subjective/Complaints: Patient states he slept well overnight. He had to the entire bed to himself. He does not have any complaints this morning.  ROS: Pt denies CP, SOB, n/v/d.   Objective: Vital Signs: Blood pressure 99/58, pulse 74, temperature 98.6 F (37 C), temperature source Oral, resp. rate 18, weight 83.643 kg (184 lb 6.4 oz), SpO2 97 %. No results found. No results for input(s): WBC, HGB, HCT, PLT in the last 72 hours. No results for input(s): NA, K, CL, GLUCOSE, BUN, CREATININE, CALCIUM in the last 72 hours.  Invalid input(s): CO CBG (last 3)  No results for input(s): GLUCAP in the last 72 hours.  Wt Readings from Last 3 Encounters:  06/29/15 83.643 kg (184 lb 6.4 oz)  06/20/15 91.7 kg (202 lb 2.6 oz)   upper extremity.  Physical Exam:  Constitutional: He appears well-developed and well-nourished. Flat affect. Alert.    HENT:  Head: Normocephalic. Left eye and face trauma Eyes: Right eye exhibits no discharge.  Left ptosis. Right - normal in appearance  Neck: Normal range of motion. Neck supple.  Cardiovascular: Normal rate and regular rhythm.  No murmur heard. Respiratory: Effort normal and breath sounds normal. No respiratory distress. He has no wheezes.  GI: Soft. Bowel sounds are normal. He exhibits no distension. There is no tenderness. No rebound Musculoskeletal: He exhibits tenderness.  Minimal edema in LUE.  Neurological: He is alert and oriented.  He is unable to lift his left eye lid.  Right facial weakness with mild to moderate dysarthria and right tongue deviation. Dense right hemiparesis of the UE: 2-/5 shoulder, 0/5 distal. RLE: still with extensor tone, grossly 1-2/5 HF,KE, ADF/APF but inconsistent. Lacks insight and awareness of deficits. Sensation intact to light touch.   5/5 on the left upper and left lower extremity Psychiatric: His affect is blunt. His speech is  delayed and slurred. He is slowed.   Assessment/Plan: 1. Functional deficits secondary to TBI with [facility] which require 3+ hours per day of interdisciplinary therapy in a comprehensive inpatient rehab setting. Physiatrist is providing close team supervision and 24 hour management of active medical problems listed below. Physiatrist and rehab team continue to assess barriers to discharge/monitor patient progress toward functional and medical goals.  Function:  Bathing Bathing position Bathing activity did not occur: Refused Position: Shower  Bathing parts Body parts bathed by patient: Right arm, Chest, Abdomen, Front perineal area, Right upper leg, Left upper leg, Right lower leg, Left lower leg, Buttocks, Left arm Body parts bathed by helper: Back  Bathing assist Assist Level: Supervision or verbal cues, Set up   Set up : To obtain items  Upper Body Dressing/Undressing Upper body dressing Upper body dressing/undressing activity did not occur: Refused What is the patient wearing?: Pull over shirt/dress     Pull over shirt/dress - Perfomed by patient: Thread/unthread left sleeve, Put head through opening, Pull shirt over trunk, Thread/unthread right sleeve Pull over shirt/dress - Perfomed by helper: Thread/unthread right sleeve        Upper body assist Assist Level: More than reasonable time      Lower Body Dressing/Undressing Lower body dressing Lower body dressing/undressing activity did not occur: Refused What is the patient wearing?: Underwear, Pants, Socks, Shoes, AFO Underwear - Performed by patient: Thread/unthread left underwear leg, Pull underwear up/down, Thread/unthread right underwear leg Underwear - Performed by helper: Thread/unthread right underwear leg Pants- Performed by patient: Thread/unthread  left pants leg, Pull pants up/down, Thread/unthread right pants leg Pants- Performed by helper: Thread/unthread right pants leg Non-skid slipper socks- Performed by patient:  Don/doff left sock Non-skid slipper socks- Performed by helper: Don/doff right sock Socks - Performed by patient: Don/doff right sock, Don/doff left sock Socks - Performed by helper: Don/doff right sock Shoes - Performed by patient: Don/doff right shoe, Don/doff left shoe, Fasten right, Fasten left Shoes - Performed by helper: Don/doff right shoe, Fasten right   AFO - Performed by helper: Don/doff right AFO      Lower body assist Assist for lower body dressing: Touching or steadying assistance (Pt > 75%)      Toileting Toileting     Toileting steps completed by helper: Adjust clothing prior to toileting, Performs perineal hygiene, Adjust clothing after toileting Toileting Assistive Devices: Grab bar or rail  Toileting assist Assist level: No help/no cues   Transfers Chair/bed transfer   Chair/bed transfer method: Stand pivot Chair/bed transfer assist level: Touching or steadying assistance (Pt > 75%) Chair/bed transfer assistive device: Hospital doctorCane     Locomotion Ambulation     Max distance: 75 Assist level: Touching or steadying assistance (Pt > 75%)   Wheelchair   Type: Manual Max wheelchair distance: 150 Assist Level: Supervision or verbal cues  Cognition Comprehension Comprehension assist level: Understands complex 90% of the time/cues 10% of the time  Expression Expression assist level: Expresses complex 90% of the time/cues < 10% of the time  Social Interaction Social Interaction assist level: Interacts appropriately 90% of the time - Needs monitoring or encouragement for participation or interaction.  Problem Solving Problem solving assist level: Solves complex 90% of the time/cues < 10% of the time, Solves basic 90% of the time/requires cueing < 10% of the time  Memory Memory assist level: Requires cues to use assistive device   Medical Problem List and Plan: 1. 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 RIght hemiparesis dense in UE , emerging synergy RLE  -Cont  CIR therapies 2. DVT Prophylaxis/Anticoagulation: Mechanical: Sequential compression devices, below knee Bilateral lower extremities -dopplers -negative 3. Pain Management: Will continue oxycodone prn. encouraged use as needed.   -robaxin prn for spasms - controlled, he has not had to take PRNs. 4. Mood: Monitor as mentation improves. LCSW to follow with patient for evaluation and support.  5. Neuropsych: This patient is not capable of making decisions on his own behalf. 6. Skin/Wound Care: Routine pressure relief measures. Monitor L-eye for resolution of edema.  7. Fluids/Electrolytes/Nutrition: Monitor I/O. continue nutritional supplements   -Sodium 134 10/14. 8. Constipation:  Senna scheduled.  Per nursing, last BM 10/20.   9. Urinary retention:Urecholine increased to qid. Continue Flomax. Emptying completely now,   10. Left retrobulbar hemorrhage with RAPD: continue observation and conservative care. Pain appears to be under control.   11. Spasticity: Per discussion and coordination with therapies, tone in RLE improved with AFO and is functional at present.  Will not add medications for the time being and continue to monitor as well as encourage ROM.    LOS (Days) 9 A FACE TO FACE EVALUATION WAS PERFORMED  Ankit Karis Jubanil Patel 07/02/2015 7:54 AM

## 2015-07-02 NOTE — Progress Notes (Signed)
Physical Therapy Session Note  Patient Details  Name: Dakota [name] MRN: [id number] Date of Birth: April 30, 1986  Today's Date: 07/02/2015 PT Individual Time: 1300-1400 PT Individual Time Calculation (min): 60 min   Short Term Goals: Week 2:  PT Short Term Goal 1 (Week 2): = LTGs of overall supervision  Skilled Therapeutic Interventions/Progress Updates:    Pt received exiting bathroom after toileting with wife - rolled out in w/c, R AFO in place. Gait Training - Pt [facility] - good speed, slight circumduction of R leg during swing and backwards trunk lean to compensate for weak hip flexors - mostly close SBA with occasional CGA. In gym, PT gives pt a SPC and instructs him in ambulation 150' x 2 reps req more frequent CGA-close SBA and above compensatory techniques noted to increase with less balance assist from AD. With fatigue, by end of session, PT notes pt's R knee popping back into hyperextension due to limited muscular  Endrance/eccentric control of hamstrings. Neuromuscular Reeducation - PT sets up low, solid bench for UE support & weightbearing through R UE with PT assist for placement and instructs pt in short to/from tall kneel transfer with PT facilitating increased weight shift over R LE, progressing to pt lifting L UE off of bench for increased load on R LE, alternating with modified "push-ups" with pt focusing on attempting to activate triceps of R UE on bench: 3 x 10 reps each. Once back in room, PT sets up mirror box at foot of bed and reviews technique to perform mirror therapy correctly with focus on motor return and has pt demonstrate the following exercises for a total of 30 minutes: fist squeezes, forearm supination/pronation, wrist extension anti-gravity, wrist flexion anti-gravity, crumpling up a washcloth and straightening it out, squeezing a stress ball, alternating thumb with fingers, tapping fingers (as one does when impatient) on mirror. Pt ended up in  w/c with family after mirror therapy. Pt demonstrates good focus/attention to task of mirror hand - PT cues pt to focus on attempting to move R hand and thinking about how each of these movements feels. PT also educates pt not to dissociate his R arm/hand from his body, after PT observes pt allow arm to flop from sink onto his lap when rolling away. PT explains that it is very important for brain rehabilitation for pt to treat R arm with care and respect and to continue to claim it as his own. Continue per PT POC.   Therapy Documentation Precautions:  Precautions Precautions: Fall Precaution Comments: R sided weakness Restrictions Weight Bearing Restrictions: No Pain: Pain Assessment Pain Assessment: No/denies pain   See Function Navigator for Current Functional Status.   Therapy/Group: Individual Therapy  [name] M 07/02/2015, 2:03 PM

## 2015-07-03 ENCOUNTER — Inpatient Hospital Stay (HOSPITAL_COMMUNITY): Payer: 59 | Admitting: Physical Therapy

## 2015-07-03 MED ORDER — BETHANECHOL CHLORIDE 10 MG PO TABS
10.0000 mg | ORAL_TABLET | Freq: Four times a day (QID) | ORAL | Status: DC
  Administered 2015-07-03 – 2015-07-05 (×10): 10 mg via ORAL
  Filled 2015-07-03 (×9): qty 1

## 2015-07-03 NOTE — Progress Notes (Signed)
[facility] PHYSICAL MEDICINE & REHABILITATION     PROGRESS NOTE    Subjective/Complaints: Patient examined this morning lying in bed with his wife. Patient states that he slept well overnight. Educated patient on importance of safety and further damage trauma to his right upper extremity. Educated patient on shoulder injury due to lack of sensation and movement in with his wife potentially rolling over on it.  ROS: Pt denies CP, SOB, n/v/d.   Objective: Vital Signs: Blood pressure 98/49, pulse 77, temperature 99.3 F (37.4 C), temperature source Oral, resp. rate 17, weight 83.643 kg (184 lb 6.4 oz), SpO2 96 %. No results found. No results for input(s): WBC, HGB, HCT, PLT in the last 72 hours. No results for input(s): NA, K, CL, GLUCOSE, BUN, CREATININE, CALCIUM in the last 72 hours.  Invalid input(s): CO CBG (last 3)  No results for input(s): GLUCAP in the last 72 hours.  Wt Readings from Last 3 Encounters:  06/29/15 83.643 kg (184 lb 6.4 oz)  06/20/15 91.7 kg (202 lb 2.6 oz)   upper extremity.  Physical Exam:  Constitutional: He appears well-developed and well-nourished. Flat affect. Alert.    HENT:  Head: Normocephalic. Left eye and face trauma Eyes: Right eye exhibits no discharge.  Left ptosis. Right - normal in appearance  Neck: Normal range of motion. Neck supple.  Cardiovascular: Normal rate and regular rhythm.  No murmur heard. Respiratory: Effort normal and breath sounds normal. No respiratory distress. He has no wheezes.  GI: Soft. Bowel sounds are normal. He exhibits no distension. There is no tenderness. No rebound Musculoskeletal: He exhibits tenderness.  Minimal edema in LUE.  Neurological: He is alert and oriented.  He is unable to lift his left eye lid.  Right facial weakness with mild to moderate dysarthria and right tongue deviation. Dense right hemiparesis of the UE: 2-/5 shoulder, 0/5 distal. RLE: still with extensor tone, grossly 1-2/5 HF,KE,  ADF/APF but inconsistent. Lacks insight and awareness of deficits. Sensation intact to light touch.   5/5 on the left upper and left lower extremity Psychiatric: His affect is blunt. His speech is delayed and slurred. He is slowed.   Assessment/Plan: 1. Functional deficits secondary to TBI with [facility] which require 3+ hours per day of interdisciplinary therapy in a comprehensive inpatient rehab setting. Physiatrist is providing close team supervision and 24 hour management of active medical problems listed below. Physiatrist and rehab team continue to assess barriers to discharge/monitor patient progress toward functional and medical goals.  Function:  Bathing Bathing position Bathing activity did not occur: Refused Position: Shower  Bathing parts Body parts bathed by patient: Right arm, Chest, Abdomen, Front perineal area, Right upper leg, Left upper leg, Right lower leg, Left lower leg, Buttocks, Left arm Body parts bathed by helper: Back  Bathing assist Assist Level: Supervision or verbal cues, Set up   Set up : To obtain items  Upper Body Dressing/Undressing Upper body dressing Upper body dressing/undressing activity did not occur: Refused What is the patient wearing?: Pull over shirt/dress     Pull over shirt/dress - Perfomed by patient: Thread/unthread left sleeve, Put head through opening, Pull shirt over trunk, Thread/unthread right sleeve Pull over shirt/dress - Perfomed by helper: Thread/unthread right sleeve        Upper body assist Assist Level: More than reasonable time      Lower Body Dressing/Undressing Lower body dressing Lower body dressing/undressing activity did not occur: Refused What is the patient wearing?: Underwear, Pants, Socks, Shoes,  AFO Underwear - Performed by patient: Thread/unthread left underwear leg, Pull underwear up/down, Thread/unthread right underwear leg Underwear - Performed by helper: Thread/unthread right underwear leg Pants- Performed by  patient: Thread/unthread left pants leg, Pull pants up/down, Thread/unthread right pants leg Pants- Performed by helper: Thread/unthread right pants leg Non-skid slipper socks- Performed by patient: Don/doff left sock Non-skid slipper socks- Performed by helper: Don/doff right sock Socks - Performed by patient: Don/doff right sock, Don/doff left sock Socks - Performed by helper: Don/doff right sock Shoes - Performed by patient: Don/doff right shoe, Don/doff left shoe, Fasten right, Fasten left Shoes - Performed by helper: Don/doff right shoe, Fasten right   AFO - Performed by helper: Don/doff right AFO      Lower body assist Assist for lower body dressing: Touching or steadying assistance (Pt > 75%)      Toileting Toileting     Toileting steps completed by helper: Adjust clothing prior to toileting, Performs perineal hygiene, Adjust clothing after toileting Toileting Assistive Devices: Grab bar or rail  Toileting assist Assist level: No help/no cues   Transfers Chair/bed transfer   Chair/bed transfer method: Ambulatory, Stand pivot Chair/bed transfer assist level: Touching or steadying assistance (Pt > 75%) Chair/bed transfer assistive device: Cane, Designer, fashion/clothingArmrests     Locomotion Ambulation     Max distance: 150' x 2 Assist level: Touching or steadying assistance (Pt > 75%)   Wheelchair   Type: Manual Max wheelchair distance: 150 Assist Level: Supervision or verbal cues  Cognition Comprehension Comprehension assist level: Understands complex 90% of the time/cues 10% of the time  Expression Expression assist level: Expresses complex 90% of the time/cues < 10% of the time  Social Interaction Social Interaction assist level: Interacts appropriately 90% of the time - Needs monitoring or encouragement for participation or interaction.  Problem Solving Problem solving assist level: Solves complex 90% of the time/cues < 10% of the time, Solves basic 90% of the time/requires cueing < 10%  of the time  Memory Memory assist level: Requires cues to use assistive device   Medical Problem List and Plan: 1. 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 DVT Prophylaxis/Anticoagulation: Mechanical: Sequential compression devices, below knee Bilateral lower extremities -dopplers -negative 3. Pain Management: Will continue oxycodone prn. encouraged use as needed.   -robaxin prn for spasms - controlled, he has not had to take PRNs. 4. Mood: Monitor as mentation improves. LCSW to follow with patient for evaluation and support.  5. Neuropsych: This patient is not capable of making decisions on his own behalf. 6. Skin/Wound Care: Routine pressure relief measures. Monitor L-eye for resolution of edema.  7. Fluids/Electrolytes/Nutrition: Monitor I/O. continue nutritional supplements   -Sodium 134 10/14. 8. Constipation:  Senna scheduled.    9. Urinary retention:Urecholine increased to qid, dose decreased to 10 QID, will cont to wean and simultaneously assess bladder function. Continue Flomax. Emptying completely now,   10. Left retrobulbar hemorrhage with RAPD: continue observation and conservative care. Pain appears to be under control.   11. Spasticity: Per discussion and coordination with therapies, tone in RLE improved with AFO and is functional at present.  Will not add medications for the time being and continue to monitor as well as encourage ROM.    LOS (Days) 10 A FACE TO FACE EVALUATION WAS PERFORMED  Dakota [name] 07/03/2015 7:56 AM

## 2015-07-04 ENCOUNTER — Inpatient Hospital Stay (HOSPITAL_COMMUNITY): Payer: 59 | Admitting: Speech Pathology

## 2015-07-04 ENCOUNTER — Inpatient Hospital Stay (HOSPITAL_COMMUNITY): Payer: 59

## 2015-07-04 ENCOUNTER — Inpatient Hospital Stay (HOSPITAL_COMMUNITY): Payer: 59 | Admitting: *Deleted

## 2015-07-04 ENCOUNTER — Inpatient Hospital Stay (HOSPITAL_COMMUNITY): Payer: 59 | Admitting: Physical Therapy

## 2015-07-04 NOTE — Progress Notes (Signed)
Occupational Therapy Session Note  Patient Details  Name: Dakota PartridgeJessie Cooks MRN: [id number] Date of Birth: December 23, 1985  Today's Date: 07/04/2015 OT Individual Time: 1000-1100 OT Individual Time Calculation (min): 60 min    Short Term Goals: Week 2:  OT Short Term Goal 1 (Week 2): Pt will perform tub/shower transfer with supervision OT Short Term Goal 2 (Week 2): Pt will perform LB dressing tasks with supervision OT Short Term Goal 3 (Week 2): Pt wil perform UB dressing tasks with mod I OT Short Term Goal 4 (Week 2): Pt will perform toileting tasks with mod I  Skilled Therapeutic Interventions/Progress Updates:    Pt engaged in BADL retraining including bathing at shower level and dressing with sit<>stand from w/c at sink. Pt completed all tasks at supervision level with min verbal cues for safety awareness with functional transfers.  Pt transitioned to therapy gym and engaged in RUE therapeutic activities with focus on scapula retraction, elevation, and shoulder flexion.  Continued to educate patient on RUE SROM and safety awareness.  Therapy Documentation Precautions:  Precautions Precautions: Fall Precaution Comments: R sided weakness Restrictions Weight Bearing Restrictions: No  Pain: Pain Assessment Pain Assessment: No/denies pain  See Function Navigator for Current Functional Status.   Therapy/Group: Individual Therapy  [name], Leora Platt Chappell 07/04/2015, 12:05 PM

## 2015-07-04 NOTE — Progress Notes (Signed)
Physical Therapy Session Note  Patient Details  Name: Dakota PartridgeJessie Mattila MRN: [id number] Date of Birth: [birth year]  Today's Date: 07/04/2015 PT Individual Time: 1130-1200 PT Individual Time Calculation (min): 30 min   Short Term Goals: Week 2:  PT Short Term Goal 1 (Week 2): = LTGs of overall supervision  Skilled Therapeutic Interventions/Progress Updates:    Pt received seated in w/c with father present; agreeable to treatment and no c/o pain. Gait with small based quad cane with CGA x150'. Obstacle course including stepping over weighted bars, around cones, onto 3" step and balance foam; CGA overall however occasional minA due to poor LLE foot placement. Educated pt on safety and awareness of LLE especially on steps and uneven surfaces. Agility ladder performed in parallel bars with no UEs, including forward walking one foot per square, side stepping, alternating stepping in/out box. Repetitive cues for hip/knee flexion when progressing RLE to decreased trunk lean/circumduction as compensation; improved with cueing however unable to maintain long term. W/c propulsion back to room with R hemi technique. Remained seated in w/c with father present and all needs within reach at completion of session.   Therapy Documentation Precautions:  Precautions Precautions: Fall Precaution Comments: R sided weakness Restrictions Weight Bearing Restrictions: No  Pain: Pain Assessment Pain Assessment: No/denies pain Pain Score: 0-No pain   See Function Navigator for Current Functional Status.   Therapy/Group: Individual Therapy  Vista Lawmanlizabeth J Tygielski 07/04/2015, 12:10 PM

## 2015-07-04 NOTE — Progress Notes (Signed)
Speech Language Pathology Daily Session Note  Patient Details  Name: Dakota [name] MRN: [id number] Date of Birth: [birth year]  Today's Date: 07/04/2015 SLP Individual Time: 0830-0930 SLP Individual Time Calculation (min): 60 min  Short Term Goals: Week 2: SLP Short Term Goal 1 (Week 2): Pt will consume regular textures and thin liquids with Mod I use of portion control and pacing with minimal overt s/s of aspiration.  SLP Short Term Goal 2 (Week 2): Pt will utilize memory compensatory strategies to facilitate recall of daily information wtih Mod I.   SLP Short Term Goal 3 (Week 2): Pt will complete semi-complex self care and/or home management tasks with Supervision level verbal cues for self-monitoring and correcting of errors.   SLP Short Term Goal 4 (Week 2): Pt will utilize word finding strategies during unstructured conversations with increased time. SLP Short Term Goal 5 (Week 2): Pt will utilize increased vocal intensity and overarticulation to achieve 95% intelligibility at the conversation level.    Skilled Therapeutic Interventions: Skilled treatment session focused on addressing cognitive goals. SLP facilitated session by providing Min assist verbal cues for right attention during wheelchair route funding to and from the therapy room.  SLP also facilitated session with education regarding a new learning task that focused on the use of association as a working Medical laboratory scientific officermemory strategy.  Patient utilized the strategy for recall of the procedures with Supervision level question cues.  SLP also provided increased wait time and Supervision level verbal cues for word finding during task.  Continue with current plan of care.    Function:  Cognition Comprehension Comprehension assist level: Understands complex 90% of the time/cues 10% of the time  Expression   Expression assist level: Expresses complex 90% of the time/cues < 10% of the time  Social Interaction Social Interaction assist level:  Interacts appropriately 75 - 89% of the time - Needs redirection for appropriate language or to initiate interaction.  Problem Solving Problem solving assist level: Solves complex 90% of the time/cues < 10% of the time;Solves basic 90% of the time/requires cueing < 10% of the time  Memory Memory assist level: Recognizes or recalls 90% of the time/requires cueing < 10% of the time    Pain Pain Assessment Pain Assessment: No/denies pain Pain Score: 0-No pain  Therapy/Group: Individual Therapy  Charlane FerrettiMelissa Lareta Bruneau, M.A., CCC-SLP [phone]  [name] 07/04/2015, 12:42 PM

## 2015-07-04 NOTE — Progress Notes (Signed)
Occupational Therapy Note  Patient Details  Name: Dakota PartridgeJessie [name] MRN: [id number] Date of Birth: 01-May-1986  Today's Date: 07/04/2015 OT Individual Time: 1300-1435 OT Individual Time Calculation (min): 95 min  and Today's Date: 07/04/2015 OT Missed Time: 25 Minutes Missed Time Reason: Patient fatigue  Pt denied pain Individual Therapy  Pt participated in community reintegration/outing at overall close supervision- min assist ambulatory level & supervision w/c level. Pt amb with RW in toy store to select birthday gift for youngest daughter.  Pt calculated approx total of 3 items selected correctly.  Pt located public restroom but declined to enter. Goals & education focused on safe functional mobility on various community surface types, identification & negotiation of obstacles, energy conservation techniques, accessing public restrooms, attending to the right, problem solving See outing goal sheet in shadow chart for full details.   Lavone NeriLanier, Maureena Dabbs [facility]ell 07/04/2015, 3:21 PM

## 2015-07-04 NOTE — Progress Notes (Signed)
[place] PHYSICAL MEDICINE & REHABILITATION     PROGRESS NOTE    Subjective/Complaints: Patient states he slept well overnight. He also notes bowel movement yesterday. He continues to have limited conversations and flat affect.  ROS: Pt denies CP, SOB, n/v/d.   Objective: Vital Signs: Blood pressure 112/56, pulse 69, temperature 98.5 F (36.9 C), temperature source Oral, resp. rate 18, weight 83.643 kg (184 lb 6.4 oz), SpO2 98 %. No results found. No results for input(s): WBC, HGB, HCT, PLT in the last 72 hours. No results for input(s): NA, K, CL, GLUCOSE, BUN, CREATININE, CALCIUM in the last 72 hours.  Invalid input(s): CO CBG (last 3)  No results for input(s): GLUCAP in the last 72 hours.  Wt Readings from Last 3 Encounters:  06/29/15 83.643 kg (184 lb 6.4 oz)  06/20/15 91.7 kg (202 lb 2.6 oz)   upper extremity.  Physical Exam:  Constitutional: He appears well-developed and well-nourished. Flat affect. Alert.    HENT:  Head: Normocephalic. Left eye and face trauma Eyes: Right eye exhibits no discharge.  Left ptosis. Right - normal in appearance  Neck: Normal range of motion. Neck supple.  Cardiovascular: Normal rate and regular rhythm.  No murmur heard. Respiratory: Effort normal and breath sounds normal. No respiratory distress. He has no wheezes.  GI: Soft. Bowel sounds are normal. He exhibits no distension. There is no tenderness. No rebound Musculoskeletal: He exhibits tenderness.  Minimal edema in LUE.  Neurological: He is alert and oriented.  He is unable to lift his left eye lid.  Right facial weakness with mild to moderate dysarthria and right tongue deviation. Dense right hemiparesis of the UE: 2-/5 shoulder, 0/5 distal. RLE: grossly 3/5/5 HF,4-/5 KE, 2/5 ADF/APF but inconsistent. Lacks insight and awareness of deficits. Sensation intact to light touch.   5/5 on the left upper and left lower extremity Psychiatric: His affect is blunt. His speech is  delayed and slurred. He is slowed.   Assessment/Plan: 1. Functional deficits secondary to TBI with [facility] which require 3+ hours per day of interdisciplinary therapy in a comprehensive inpatient rehab setting. Physiatrist is providing close team supervision and 24 hour management of active medical problems listed below. Physiatrist and rehab team continue to assess barriers to discharge/monitor patient progress toward functional and medical goals.  Function:  Bathing Bathing position Bathing activity did not occur: Refused Position: Shower  Bathing parts Body parts bathed by patient: Right arm, Chest, Abdomen, Front perineal area, Right upper leg, Left upper leg, Right lower leg, Left lower leg, Buttocks, Left arm Body parts bathed by helper: Back  Bathing assist Assist Level: Supervision or verbal cues, Set up   Set up : To obtain items  Upper Body Dressing/Undressing Upper body dressing Upper body dressing/undressing activity did not occur: Refused What is the patient wearing?: Pull over shirt/dress     Pull over shirt/dress - Perfomed by patient: Thread/unthread left sleeve, Put head through opening, Pull shirt over trunk, Thread/unthread right sleeve Pull over shirt/dress - Perfomed by helper: Thread/unthread right sleeve        Upper body assist Assist Level: More than reasonable time      Lower Body Dressing/Undressing Lower body dressing Lower body dressing/undressing activity did not occur: Refused What is the patient wearing?: Underwear, Pants, Socks, Shoes, AFO Underwear - Performed by patient: Thread/unthread left underwear leg, Pull underwear up/down, Thread/unthread right underwear leg Underwear - Performed by helper: Thread/unthread right underwear leg Pants- Performed by patient: Thread/unthread left pants leg,  Pull pants up/down, Thread/unthread right pants leg Pants- Performed by helper: Thread/unthread right pants leg Non-skid slipper socks- Performed by patient:  Don/doff left sock Non-skid slipper socks- Performed by helper: Don/doff right sock Socks - Performed by patient: Don/doff right sock, Don/doff left sock Socks - Performed by helper: Don/doff right sock Shoes - Performed by patient: Don/doff right shoe, Don/doff left shoe, Fasten right, Fasten left Shoes - Performed by helper: Don/doff right shoe, Fasten right   AFO - Performed by helper: Don/doff right AFO      Lower body assist Assist for lower body dressing: Touching or steadying assistance (Pt > 75%)      Toileting Toileting   Toileting steps completed by patient: Adjust clothing prior to toileting, Performs perineal hygiene, Adjust clothing after toileting Toileting steps completed by helper: Adjust clothing prior to toileting, Performs perineal hygiene, Adjust clothing after toileting Toileting Assistive Devices: Grab bar or rail  Toileting assist Assist level: No help/no cues   Transfers Chair/bed transfer   Chair/bed transfer method: Stand pivot, Ambulatory Chair/bed transfer assist level: Touching or steadying assistance (Pt > 75%) Chair/bed transfer assistive device: Cane, Orthosis, Armrests     Locomotion Ambulation     Max distance: 150 Assist level: Moderate assist (Pt 50 - 74%)   Wheelchair   Type: Manual Max wheelchair distance: 150 Assist Level: Supervision or verbal cues  Cognition Comprehension Comprehension assist level: Understands complex 90% of the time/cues 10% of the time  Expression Expression assist level: Expresses complex 90% of the time/cues < 10% of the time  Social Interaction Social Interaction assist level: Interacts appropriately 90% of the time - Needs monitoring or encouragement for participation or interaction.  Problem Solving Problem solving assist level: Solves complex 90% of the time/cues < 10% of the time  Memory Memory assist level: Recognizes or recalls 90% of the time/requires cueing < 10% of the time   Medical Problem List and  Plan: 1. 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 DVT Prophylaxis/Anticoagulation: Mechanical: Sequential compression devices, below knee Bilateral lower extremities -dopplers -negative 3. Pain Management: Will continue oxycodone prn. encouraged use as needed.   -robaxin prn for spasms - controlled, he has not had to take PRNs. 4. Mood: Monitor as mentation improves. LCSW to follow with patient for evaluation and support.  5. Neuropsych: This patient is not capable of making decisions on his own behalf. 6. Skin/Wound Care: Routine pressure relief measures. Monitor L-eye for resolution of edema.  7. Fluids/Electrolytes/Nutrition: Monitor I/O. continue nutritional supplements   -Sodium 134 10/14. 8. Constipation:  Senna scheduled.    9. Urinary retention:Urecholine increased to qid, dose decreased to 10 QID on 10/23, will cont to wean and simultaneously assess bladder function. Continue Flomax. Emptying completely now,    - Continues to void well 10. Left retrobulbar hemorrhage with RAPD: continue observation and conservative care. Pain appears to be under control.   11. Spasticity: Per discussion and coordination with therapies, tone in RLE improved with AFO and is functional at present.  Will not add medications for the time being and continue to monitor as well as encourage ROM.    LOS (Days) 11 A FACE TO FACE EVALUATION WAS PERFORMED  Ankit Karis Jubanil Patel 07/04/2015 8:03 AM

## 2015-07-04 NOTE — Progress Notes (Signed)
Recreational Therapy Session Note  Patient Details  Name: Dakota [name] MRN: [id number] Date of Birth: 11/16/1985 Today's Date: 07/04/2015  Pain: no c/o Skilled Therapeutic Interventions/Progress Updates: Pt participated in community reintegration/outing at overall close supervision- min assist ambulatory level using [facility] & supervision w/c level.  Goals & education focused on safe functional mobility on various community surface types, identification & negotiation of obstacles, energy conservation techniques, accessing public restrooms, attending to the right, problem solving, math calculations, & vocal quality. See outing goal sheet in shadow chart for full details.  Therapy/Group: [employer] [name] 07/04/2015, 10:30 AM

## 2015-07-05 ENCOUNTER — Inpatient Hospital Stay (HOSPITAL_COMMUNITY): Payer: 59 | Admitting: Physical Therapy

## 2015-07-05 ENCOUNTER — Inpatient Hospital Stay (HOSPITAL_COMMUNITY): Payer: 59 | Admitting: Occupational Therapy

## 2015-07-05 ENCOUNTER — Inpatient Hospital Stay (HOSPITAL_COMMUNITY): Payer: 59

## 2015-07-05 ENCOUNTER — Inpatient Hospital Stay (HOSPITAL_COMMUNITY): Payer: 59 | Admitting: *Deleted

## 2015-07-05 NOTE — Progress Notes (Signed)
Occupational Therapy Session Note  Patient Details  Name: Dakota PartridgeJessie Roznowski MRN: [id number] Date of Birth: [birth year]  Today's Date: 07/05/2015 OT Individual Time: 0700-0800 OT Individual Time Calculation (min): 60 min    Short Term Goals: Week 2:  OT Short Term Goal 1 (Week 2): Pt will perform tub/shower transfer with supervision OT Short Term Goal 2 (Week 2): Pt will perform LB dressing tasks with supervision OT Short Term Goal 3 (Week 2): Pt wil perform UB dressing tasks with mod I OT Short Term Goal 4 (Week 2): Pt will perform toileting tasks with mod I  Skilled Therapeutic Interventions/Progress Updates:    Pt engaged in BADL retraining including bathing at shower level and dressing with sit<>stand from w/c.  Pt gathered clothing and supplies at w/c level before entering bathroom.  Pt required hand over hand assistance to use RUE to assist with bathing tasks.  Pt performed all functional transfers with supervision and min verbal cues for safety awareness.  Focus on family education with wife, functional transfers, sit<>stand, standing balance, increased RUE use for functional tasks, and safety awareness to increased independence with BADLs.  Therapy Documentation Precautions:  Precautions Precautions: Fall Precaution Comments: R sided weakness Restrictions Weight Bearing Restrictions: No Pain: Pain Assessment Pain Assessment: No/denies pain See Function Navigator for Current Functional Status.   Therapy/Group: Individual Therapy  [name], Jaidan Stachnik Chappell 07/05/2015, 8:00 AM

## 2015-07-05 NOTE — Progress Notes (Signed)
Occupational Therapy Session Note  Patient Details  Name: Collier Bohnet MRN: [id number] Date of Birth: 28-Feb-1986  Today's Date: 07/05/2015 OT Individual Time: 0830-0900 OT Individual Time Calculation (min): 30 min    Short Term Goals: Week 1:  OT Short Term Goal 1 (Week 1): Pt will transfer to the toilet with min A using a squat pivot transfer. OT Short Term Goal 1 - Progress (Week 1): Met OT Short Term Goal 2 (Week 1): Pt will complete toileting with steadying A. OT Short Term Goal 2 - Progress (Week 1): Met OT Short Term Goal 3 (Week 1): Pt will don shirt with set up. OT Short Term Goal 3 - Progress (Week 1): Met OT Short Term Goal 4 (Week 1): Pt will dress LB with min A. OT Short Term Goal 4 - Progress (Week 1): Met OT Short Term Goal 5 (Week 1): Pt will complete RUE self ROM ex with min cues. OT Short Term Goal 5 - Progress (Week 1): Met Week 2:  OT Short Term Goal 1 (Week 2): Pt will perform tub/shower transfer with supervision OT Short Term Goal 2 (Week 2): Pt will perform LB dressing tasks with supervision OT Short Term Goal 3 (Week 2): Pt wil perform UB dressing tasks with mod I OT Short Term Goal 4 (Week 2): Pt will perform toileting tasks with mod I     Skilled Therapeutic Interventions/Progress Updates:    Pt seen this session for RUE NMR to facilitate active movement. Pt received in room with dad with him. He amb with S with quad cane to gym.  Active ROM of shoulder elevation and slight retraction.  Worked on wt bearing on R hand with facilitation through tricep for ext with forward and rotational reaching. Educated pt and dad on how to practice this in room using a firm seat to place hand on. 12 min of estim to wrist/finger extensors at intensity 39. Pt tolerated stimulation well using a cone to facilitate grasp and release. No active movement after tx, but spent time discussing mind/body connection to stimulate arm.  Ambulated back to room to practice RUE wt bearing sitting  in recliner seat.  Pt with dad working on exercises.    Therapy Documentation Precautions:  Precautions Precautions: Fall Precaution Comments: R sided weakness Restrictions Weight Bearing Restrictions: No Vital Signs: Therapy Vitals Temp: 97.9 F (36.6 C) Temp Source: Oral Pulse Rate: 62 Resp: 20 BP: 110/64 mmHg Patient Position (if appropriate): Lying Oxygen Therapy SpO2: 98 % O2 Device: Not Delivered Pain: Pain Assessment Pain Assessment: No/denies pain ADL:  See Function Navigator for Current Functional Status.   Therapy/Group: Individual Therapy  SAGUIER,JULIA 07/05/2015, 9:24 AM

## 2015-07-05 NOTE — Progress Notes (Signed)
Occupational Therapy Note  Patient Details  Name: Dakota [name] MRN: [id number] Date of Birth: 04-22-1986  Today's Date: 07/05/2015 OT Individual Time: 1300-1400 OT Individual Time Calculation (min): 60 min   Pt denied pain Individual Therapy (cotx with Recreational Therapist from 1330-1400)  Pt resting in w/c with Dad present.  Pt engaged in RUE NMR including weight bearing through shoulder and hand with triceps extension.  Engaged in reaching activities with LUE across midline and forward while bearing weight through RUE.  Pt also engaged in dynamic standing tasks with RLE approx 6" forward of LLE to facilitate weight bearing through RLE and RUE while reaching for objects.  Pt transitioned to side lying on mat on floor with weight bearing through shoulder while engaging in "pipe tree" task.  Pt was able to safely and efficiently get up from floor and sit on EOM.  No active movement noted after weight bearing.  Slight scapula retraction and elevation noted.     Lavone NeriLanier, [name] 07/05/2015, 2:58 PM

## 2015-07-05 NOTE — Progress Notes (Signed)
Dakota [name] PHYSICAL MEDICINE & REHABILITATION     PROGRESS NOTE    Subjective/Complaints: Patient seen this morning about to start therapies. His wife is at bedside. He does not have any complaints this morning. His wife does note that his spasticity in his right ankle is improving.  ROS: Pt denies CP, SOB, n/v/d.   Objective: Vital Signs: Blood pressure 110/64, pulse 62, temperature 97.9 F (36.6 C), temperature source Oral, resp. rate 20, weight 83.643 kg (184 lb 6.4 oz), SpO2 98 %. No results found. No results for input(s): WBC, HGB, HCT, PLT in the last 72 hours. No results for input(s): NA, K, CL, GLUCOSE, BUN, CREATININE, CALCIUM in the last 72 hours.  Invalid input(s): CO CBG (last 3)  No results for input(s): GLUCAP in the last 72 hours.  Wt Readings from Last 3 Encounters:  06/29/15 83.643 kg (184 lb 6.4 oz)  06/20/15 91.7 kg (202 lb 2.6 oz)   upper extremity.  Physical Exam:  Constitutional: He appears well-developed and well-nourished. Flat affect. Alert.    HENT:  Head: Normocephalic. Left eye and face trauma Eyes: Right eye exhibits no discharge.  Left ptosis. Right - normal in appearance  Neck: Normal range of motion. Neck supple.  Cardiovascular: Normal rate and regular rhythm.  No murmur heard. Respiratory: Effort normal and breath sounds normal. No respiratory distress. He has no wheezes.  GI: Soft. Bowel sounds are normal. He exhibits no distension. There is no tenderness. No rebound Musculoskeletal: He exhibits tenderness.  Minimal edema in LUE.  Neurological: He is alert and oriented.  He is unable to lift his left eye lid.  Right facial weakness with mild to moderate dysarthria and right tongue deviation. Dense right hemiparesis of the UE: 2/5 shoulder, 0/5 distal. RLE: grossly 3/5/5 HF,4-/5 KE, 2/5 ADF/APF but inconsistent. Lacks insight and awareness of deficits. Sensation intact to light touch.   5/5 on the left upper and left lower  extremity Psychiatric: His affect is blunt. His speech is delayed and slurred. He is slowed.   Assessment/Plan: 1. [facility] which [facility] 3+ hours per day of interdisciplinary therapy in a comprehensive inpatient rehab setting. Physiatrist is providing close team supervision and 24 hour management of active medical problems listed below. Physiatrist and rehab team continue to assess barriers to discharge/monitor patient progress toward functional and medical goals.  Function:  Bathing Bathing position Bathing activity did not occur: Refused Position: Shower  Bathing parts Body parts bathed by patient: Right arm, Chest, Abdomen, Front perineal area, Right upper leg, Left upper leg, Right lower leg, Left lower leg, Buttocks, Left arm Body parts bathed by helper: Back  Bathing assist Assist Level: Supervision or verbal cues   Set up : To obtain items  Upper Body Dressing/Undressing Upper body dressing Upper body dressing/undressing activity did not occur: Refused What is the patient wearing?: Pull over shirt/dress     Pull over shirt/dress - Perfomed by patient: Thread/unthread left sleeve, Put head through opening, Pull shirt over trunk, Thread/unthread right sleeve Pull over shirt/dress - Perfomed by helper: Thread/unthread right sleeve        Upper body assist Assist Level: More than reasonable time      Lower Body Dressing/Undressing Lower body dressing Lower body dressing/undressing activity did not occur: Refused What is the patient wearing?: Underwear, Pants, Socks, Shoes, AFO Underwear - Performed by patient: Thread/unthread left underwear leg, Pull underwear up/down, Thread/unthread right underwear leg Underwear - Performed by helper: Thread/unthread  right underwear leg Pants- Performed by patient: Thread/unthread left pants leg, Pull pants up/down, Thread/unthread right pants leg Pants- Performed by helper: Thread/unthread right pants  leg Non-skid slipper socks- Performed by patient: Don/doff left sock Non-skid slipper socks- Performed by helper: Don/doff right sock Socks - Performed by patient: Don/doff right sock, Don/doff left sock Socks - Performed by helper: Don/doff right sock Shoes - Performed by patient: Don/doff right shoe, Don/doff left shoe, Fasten right, Fasten left Shoes - Performed by helper: Don/doff right shoe, Fasten right   AFO - Performed by helper: Don/doff right AFO      Lower body assist Assist for lower body dressing: Supervision or verbal cues      Toileting Toileting   Toileting steps completed by patient: Adjust clothing prior to toileting, Performs perineal hygiene, Adjust clothing after toileting Toileting steps completed by helper: Adjust clothing prior to toileting, Performs perineal hygiene, Adjust clothing after toileting Toileting Assistive Devices: Grab bar or rail  Toileting assist Assist level: No help/no cues   Transfers Chair/bed transfer   Chair/bed transfer method: Stand pivot, Ambulatory Chair/bed transfer assist level: Touching or steadying assistance (Pt > 75%) Chair/bed transfer assistive device: Cane, Orthosis, Armrests     Locomotion Ambulation     Max distance: 150 Assist level: Touching or steadying assistance (Pt > 75%)   Wheelchair   Type: Manual Max wheelchair distance: 150 Assist Level: Supervision or verbal cues  Cognition Comprehension Comprehension assist level: Understands complex 90% of the time/cues 10% of the time  Expression Expression assist level: Expresses complex 90% of the time/cues < 10% of the time  Social Interaction Social Interaction assist level: Interacts appropriately 75 - 89% of the time - Needs redirection for appropriate language or to initiate interaction.  Problem Solving Problem solving assist level: Solves complex 90% of the time/cues < 10% of the time, Solves basic 90% of the time/requires cueing < 10% of the time  Memory  Memory assist level: Recognizes or recalls 90% of the time/requires cueing < 10% of the time   Medical Problem List and Plan: 1. 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 DVT Prophylaxis/Anticoagulation: Mechanical: Sequential compression devices, below knee Bilateral lower extremities -dopplers -negative 3. Pain Management: Will continue oxycodone prn. encouraged use as needed.   -robaxin prn for spasms - controlled, he has not had to take PRNs. 4. Mood: Monitor as mentation improves. LCSW to follow with patient for evaluation and support.  5. Neuropsych: This patient is not capable of making decisions on his own behalf. 6. Skin/Wound Care: Routine pressure relief measures. Monitor L-eye for resolution of edema.  7. Fluids/Electrolytes/Nutrition: Monitor I/O. continue nutritional supplements   -Sodium 134 10/14.  - Will order labs for tomorrow 8. Constipation:  Senna scheduled.    9. Urinary retention:Urecholine increased to qid, dose decreased to 10 QID on 10/23, will cont to wean and simultaneously assess bladder function. Continue Flomax. Emptying completely now,    - Continues to void well  - Will decrease again tomorrow if stable. 10. Left retrobulbar hemorrhage with RAPD: continue observation and conservative care. Pain appears to be under control.   11. Spasticity: Per discussion and coordination with therapies, tone in RLE improved with AFO and is functional at present.  Will not add medications for the time being and continue to monitor as well as encourage ROM.    LOS (Days) 12 A FACE TO FACE EVALUATION  WAS PERFORMED  Ankit Karis Juba 07/05/2015 8:23 AM

## 2015-07-05 NOTE — Progress Notes (Signed)
Physical Therapy Weekly Progress Note  Patient Details  Name: Dakota [name] MRN: [id number] Date of Birth: 14-Mar-1986  Beginning of progress report period: June 30, 2015 End of progress report period: July 06, 2015  Today's Date: 07/06/2015 PT Individual Time: 3267-1245 PT Individual Time Calculation (min): 85 min   Patient has met long term goals of overall supervision, therefore LTGs upgraded to mod I except S community ambulation and stairs. Patient currently requires supervision overall for functional mobility using SPC with focus on safety awareness and decreasing compensatory strategies to promote R sided neuromuscular re-ed. Patient and family education ongoing with hands-on family training initiated.   Patient continues to demonstrate the following deficits: muscle weakness, muscle joint tightness, muscle paralysis, decreased cardiorespiratory endurance, impaired timing and sequencing, abnormal tone, unbalanced muscle activation, decreased coordination, decreased visual acuity, decreased attention to right, decreased anticipatory awareness, decreased problem solving, decreased safety awareness, decreased standing balance, decreased postural control, hemiplegia, and decreased balance strategies and therefore will continue to benefit from skilled PT intervention to enhance overall performance with activity tolerance, balance, postural control, ability to compensate for deficits, functional use of  right upper extremity and right lower extremity, attention, awareness and coordination.  See Patient's Care Plan for progression toward long term goals.  Patient progressing toward long term goals.  Plan of care revisions: upgraded to mod I overall except S stairs and community ambulation.  Skilled Therapeutic Interventions/Progress Updates:   Gait using SPC 2 x 160 ft with supervision. Session focused on neuromuscular re-education for lateral trunk shortening/elongation with reaching task  for cards under L hip and outside BOS to L and reciprocal scooting with > without BLE supported, standing split leg stance with resisted R pelvic anterior rotation, squats with focus on hip hinge and keeping weight in heels with mirror for visual feedback and max tactile cues, RLE bridging with RLE off mat on step stool with 5 sec hold x 10 (LLE extended), NuStep using BLE only at level 8 x 10 min. Patient left sitting in wheelchair with wife present.   Therapy Documentation Precautions:  Precautions Precautions: Fall Precaution Comments: R sided weakness Restrictions Weight Bearing Restrictions: No Pain: Pain Assessment Pain Assessment: No/denies pain  See Function Navigator for Current Functional Status.  Therapy/Group: Individual Therapy  Laretta Alstrom 07/06/2015, 5:11 PM

## 2015-07-05 NOTE — Progress Notes (Signed)
Physical Therapy Session Note  Patient Details  Name: Dakota [name] MRN: [id number] Date of Birth: 01-31-1986  Today's Date: 07/05/2015 PT Individual Time: [id number] and 1530-1630 PT Individual Time Calculation (min): 60 min and 60 min  Short Term Goals: Week 2:  PT Short Term Goal 1 (Week 2): = LTGs of overall supervision  Skilled Therapeutic Interventions/Progress Updates:   Treatment 1: Upon arrival to room, patient and father with questions regarding vision in L eye and use of patch, notified OT to discuss further with patient and family. Gait training using SBQC with PLS AFO donned x 180 ft with supervision and frequent R knee hyperextension >80% of time. Added heel wedge to shoe for gait x 90 ft with only 1 episode of knee hyperextension when pivoting on RLE during turn. Trialed use of SPC x 200 ft with supervision and no significant change in gait mechanics in controlled and home environments. Plan to continue use of SPC at this time. Stair training in stairwell up/down 22 steps using R rail with step-to pattern and reciprocal pattern, patient self-electing to lead with RLE demonstrating improved R hip flexion. Patient requested to ambulate without AFO using SPC, demonstrating increased R ankle inversion and decreased R knee stability, therefore added foam wedge along outer edge of R shoe. Patient began performing squats, required max multimodal cues for correct form with focus on hip hinge, knee abduction, keeping spine straight, and keeping hips back due to tendency to advance knees forward over toes and utilized mirror for visual feedback. Gait using SPC back to room without AD donned, supervision-min guard due to fatigue and patient report of RLE feeling "like jello." Patient left sitting in recliner with needs within reach, father present.   Treatment 2: Treadmill gait training with LUE support on rail and R AFO donned with focus on manual cueing at R hamstring to decrease R knee  hyperextension/increase R knee control in terminal stance and facilitate R heel strike x 500 ft at 0.8 mph for 7:15 min. Patient demonstrating trace R ankle DF, therefore attempted Bioness for R ankle dorsiflexion and hamstring activation. Performed NMR with Bioness L300 Plus, see Bioness and below for details.Patient left sitting in wheelchair with family present.   Therapy Documentation Precautions:  Precautions Precautions: Fall Precaution Comments: R sided weakness Restrictions Weight Bearing Restrictions: No Pain: Pain Assessment Pain Assessment: No/denies pain Other Treatments: Treatments Neuromuscular Facilitation: Right;Lower Extremity;Activity to increase timing and sequencing;Activity to increase motor control;Activity to increase sustained activation with Bioness on R lower LE for DF/hamstring activation for improved R knee control in initial stance and terminal stance during gait 2 x 90 ft + 180 ft with SPC and S-min A overall with continued facilitation and verbal cues for pelvic rotation, weight shifting, RLE flexion during swing, and heel strike at initial stance. Using L rail in hallway, patient performed forwards/backwards gait while maintaining squat position, tactile cues for hip hinge and keeping hips back. With Bioness pt experienced minimal DF due to strong eversion tendencies but was better able to control R knee with verbal cuing and use of Bioness.   See Function Navigator for Current Functional Status.   Therapy/Group: Individual Therapy  [name], Kacia Halley A 07/05/2015, 10:10 AM

## 2015-07-06 ENCOUNTER — Inpatient Hospital Stay (HOSPITAL_COMMUNITY): Payer: 59

## 2015-07-06 ENCOUNTER — Inpatient Hospital Stay (HOSPITAL_COMMUNITY): Payer: 59 | Admitting: Physical Therapy

## 2015-07-06 ENCOUNTER — Inpatient Hospital Stay (HOSPITAL_COMMUNITY): Payer: 59 | Admitting: Occupational Therapy

## 2015-07-06 ENCOUNTER — Inpatient Hospital Stay (HOSPITAL_COMMUNITY): Payer: 59 | Admitting: Speech Pathology

## 2015-07-06 LAB — BASIC METABOLIC PANEL
Anion gap: 15 (ref 5–15)
BUN: 12 mg/dL (ref 6–20)
CO2: 23 mmol/L (ref 22–32)
CREATININE: 0.76 mg/dL (ref 0.61–1.24)
Calcium: 9.7 mg/dL (ref 8.9–10.3)
Chloride: 100 mmol/L — ABNORMAL LOW (ref 101–111)
GFR calc Af Amer: >60 mL/min (ref >60)
Glucose, Bld: 109 mg/dL — ABNORMAL HIGH (ref 65–99)
Potassium: 4.1 mmol/L (ref 3.5–5.1)
SODIUM: 138 mmol/L (ref 135–145)

## 2015-07-06 LAB — CBC WITH DIFFERENTIAL/PLATELET
Basophils Absolute: 0.1 10*3/uL (ref 0.0–0.1)
Basophils Relative: 1 %
EOS ABS: 0.3 10*3/uL (ref 0.0–0.7)
EOS PCT: 3 %
HCT: 40.1 % (ref 39.0–52.0)
Hemoglobin: 13.1 g/dL (ref 13.0–17.0)
LYMPHS ABS: 2.4 10*3/uL (ref 0.7–4.0)
Lymphocytes Relative: 27 %
MCH: 27.8 pg (ref 26.0–34.0)
MCHC: 32.7 g/dL (ref 30.0–36.0)
MCV: 85.1 fL (ref 78.0–100.0)
MONOS PCT: 8 %
Monocytes Absolute: 0.7 10*3/uL (ref 0.1–1.0)
Neutro Abs: 5.2 10*3/uL (ref 1.7–7.7)
Neutrophils Relative %: 61 %
PLATELETS: 289 10*3/uL (ref 150–400)
RBC: 4.71 MIL/uL (ref 4.22–5.81)
RDW: 13.5 % (ref 11.5–15.5)
WBC: 8.6 10*3/uL (ref 4.0–10.5)

## 2015-07-06 LAB — CDS SEROLOGY

## 2015-07-06 MED ORDER — BETHANECHOL CHLORIDE 10 MG PO TABS: 5.0000 mg | ORAL_TABLET | Freq: Four times a day (QID) | ORAL | Status: DC

## 2015-07-06 MED ORDER — BETHANECHOL CHLORIDE 10 MG PO TABS
5.0000 mg | ORAL_TABLET | Freq: Three times a day (TID) | ORAL | Status: DC
  Administered 2015-07-06 – 2015-07-07 (×4): 5 mg via ORAL
  Filled 2015-07-06 (×4): qty 1

## 2015-07-06 NOTE — Progress Notes (Signed)
Speech Language Pathology Daily Session Note  Patient Details  Name: Dakota [name] MRN: [id number] Date of Birth: 10-05-1985  Today's Date: 07/06/2015 SLP Individual Time: 0800-0900 SLP Individual Time Calculation (min): 60 min  Short Term Goals: Week 2: SLP Short Term Goal 1 (Week 2): Pt will consume regular textures and thin liquids with Mod I use of portion control and pacing with minimal overt s/s of aspiration.  SLP Short Term Goal 2 (Week 2): Pt will utilize memory compensatory strategies to facilitate recall of daily information wtih Mod I.   SLP Short Term Goal 3 (Week 2): Pt will complete semi-complex self care and/or home management tasks with Supervision level verbal cues for self-monitoring and correcting of errors.   SLP Short Term Goal 4 (Week 2): Pt will utilize word finding strategies during unstructured conversations with increased time. SLP Short Term Goal 5 (Week 2): Pt will utilize increased vocal intensity and overarticulation to achieve 95% intelligibility at the conversation level.    Skilled Therapeutic Interventions: Skilled treatment session focused on cognitive goals. Upon arrival, patient was sitting upright in the wheelchair and was agreeable to participate in treatment session. Patient was administered the MoCA and scored 19/22 points with 18 or above considered normal. Patient also participated in a navigation/pathfinding task in regards to locating specific places around the unit while utilizing semantic clues. Patient completed task with extra time and question cue X1 for problem solving throughout the task. Patient was 100% intelligible at the conversation level and was Mod I for word-finding. Patient also attended to his right field of environment with Mod I. Patient left upright in wheelchair with father present. Continue with current plan of care.    Function:  Cognition Comprehension Comprehension assist level: Understands complex 90% of the time/cues 10%  of the time  Expression   Expression assist level: Expresses basic needs/ideas: With extra time/assistive device  Social Interaction Social Interaction assist level: Interacts appropriately 90% of the time - Needs monitoring or encouragement for participation or interaction.  Problem Solving Problem solving assist level: Solves complex 90% of the time/cues < 10% of the time  Memory Memory assist level: Recognizes or recalls 90% of the time/requires cueing < 10% of the time    Pain Pain Assessment Pain Assessment: No/denies pain  Therapy/Group: Individual Therapy  Dakota [name] 07/06/2015, 3:54 PM

## 2015-07-06 NOTE — Progress Notes (Signed)
   07/06/15 1800  What Happened  Was fall witnessed? No  Was patient injured? Unsure  Patient found on floor  Found by Staff-comment Lelon Mast(Nicole Carter, NT)  Stated prior activity bathroom-unassisted  Follow Up  MD notified Marissa NestlePam Love, PA  Time MD notified [phone]  Family notified Yes-comment (wife at bedside)  Time family notified 1812  Additional tests No  Simple treatment Ice (if needed- patient stated no injury)  Progress note created (see row info) Yes  Adult Fall Risk Assessment  Risk Factor Category (scoring not indicated) Fall has occurred during this admission (document High fall risk)  Patient's Fall Risk High Fall Risk (>13 points)  Adult Fall Risk Interventions  Required Bundle Interventions *See Row Information* High fall risk - low, moderate, and high requirements implemented  Additional Interventions Individualized elimination schedule;Fall risk signage  Vitals  Temp 98.6 F (37 C)  Temp Source Oral  BP 129/75 mmHg  BP Location Left Arm  BP Method Automatic  Patient Position (if appropriate) Sitting  Pulse Rate 92  Pulse Rate Source Dinamap  Resp 18  Oxygen Therapy  SpO2 99 %  O2 Device Room Air  Pain Assessment  Pain Assessment No/denies pain  PCA/Epidural/Spinal Assessment  Respiratory Pattern Regular  Neurological  Level of Consciousness Alert  Orientation Level Oriented X4  Cognition Other (Comment) (at baseline per patients wife)  Speech Clear  Integumentary  Integumentary (WDL) WDL (states he hit his back on trash can but no redness evident)    Patient states he was attempting to walk to bathroom unassisted.  He forgot to lock his wheelchair before pushing to stand.  He was not wearing his safety belt.  His wife states he hit his back on the trash can.  Lelon MastNicole Carter, NT found patient, performed VS- stable.  Assessed patient.  No redness or tenderness noted to back.  Patient denies pain.  Notified primary nurse.  Called Pam Love, PA who recommended  just following fall algorithm for patient without head injury. She also recommended icing the affected area overnight if needed for localized pain control.  Assisted patient to bathroom.  Wife at bedside.  Appears to be in no immediate distress at this time.  Dani Gobbleeardon, Braelyn Jenson J, RN

## 2015-07-06 NOTE — Progress Notes (Signed)
Social Work Patient ID: Dakota PartridgeJessie Standing, male   DOB: 1986/03/05, 29 y.o.   MRN: [id number]   Have reviewed team conference with pt and wife.  Both very pleased with progress and plans to still aim for 11/3 d/c date.  Excited to report that he showed more movement in shoulder today and encouraged for more gains.  No concerns at this time.  Madix Blowe, LCSW

## 2015-07-06 NOTE — Progress Notes (Signed)
Occupational Therapy Session Note  Patient Details  Name: Dakota [name] MRN: [id number] Date of Birth: 04-30-1986  Today's Date: 07/06/2015 OT Individual Time: 1300-1400 OT Individual Time Calculation (min): 60 min    Short Term Goals: Week 2:  OT Short Term Goal 1 (Week 2): Pt will perform tub/shower transfer with supervision OT Short Term Goal 2 (Week 2): Pt will perform LB dressing tasks with supervision OT Short Term Goal 3 (Week 2): Pt wil perform UB dressing tasks with mod I OT Short Term Goal 4 (Week 2): Pt will perform toileting tasks with mod I  Skilled Therapeutic Interventions/Progress Updates:    Pt engaged in BADL retraining including bathing at shower level and dressing with sit<>stand from w/c.  Focus on increased functional use of RUE in BADLs.  Pt issued bath mit to facilitate bathing tasks.  Pt initiates use of RUE in all bathing and dressing tasks.  Pt required mod multimodal cues for correct movements and inhibit compensatory recruiting of associated muscle groups during RUE use.  Pt encouraged by increased use of RUE.  Therapy Documentation Precautions:  Precautions Precautions: Fall Precaution Comments: R sided weakness Restrictions Weight Bearing Restrictions: No  See Function Navigator for Current Functional Status.   Therapy/Group: Individual Therapy  Dakota [name], Dakota [name] 07/06/2015, 3:14 PM

## 2015-07-06 NOTE — Progress Notes (Signed)
Occupational Therapy Note  Patient Details  Name: Dakota PartridgeJessie [name] MRN: [id number] Date of Birth: [birth year]  Today's Date: 07/06/2015 OT Individual Time: 1000-1100 OT Individual Time Calculation (min): 60 min   Pt denied pain Individual therapy  Pt seen with OT Clinical Specialist to continue RUE NMR and therapeutic activities.  Pt engaged in RUE NMR/therapeutic activities in variety of positions including sitting EOM, supine and side lying on nonaffected side.  Pt noted with increased Rt scapula retraction and elevation, deltoid movement, biceps and triceps activity.  Increased movement noted with shoulder flexion/extension, elbow flexion/extension, and shoulder abduction/adduction. Pt also noted with increased grasp with Rt hand.  Focus on isolated movements with minimum recruiting of associated muscle groups.  Pt encouraged by increased movement.  Discussed with patient that he will be encouraged to used his RUE during BADLs.  Pt verbalized understanding.   Lavone NeriLanier, Vic Esco [facility] 07/06/2015, 12:21 PM

## 2015-07-06 NOTE — Progress Notes (Signed)
[facility]IC[facility]ILIT[facility]     PROGRESS NOTE    Subjective/Complaints: Pt states he slept well overnight.  He continues to void without difficulty.  He also notes some increased strength in his right ankle.    ROS: Pt denies CP, SOB, n/v/d.   Objective: Vital Signs: Blood pressure 108/56, pulse 85, temperature 98.2 F (36.8 C), temperature source Oral, resp. rate 18, weight 83.643 kg (184 lb 6.4 oz), SpO2 96 %. No results found.  Recent Labs  07/06/15 0532  WBC 8.6  HGB 13.1  HCT 40.1  PLT 289    Recent Labs  07/06/15 0532  NA 138  K 4.1  CL 100*  GLUCOSE 109*  BUN 12  CREATININE 0.76  CALCIUM 9.7   CBG (last 3)  No results for input(s): GLUCAP in the last 72 hours.  Wt Readings from Last 3 Encounters:  06/29/15 83.643 kg (184 lb 6.4 oz)  06/20/15 91.7 kg (202 lb 2.6 oz)   upper extremity.  Physical Exam:  Constitutional: He appears well-developed and well-nourished. Flat affect. Alert.    HENT:  Head: Normocephalic. Left eye and face trauma Eyes: Right eye exhibits no discharge.  Left ptosis. Right - normal in appearance  Neck: Normal range of motion. Neck supple.  Cardiovascular: Normal rate and regular rhythm.  No murmur heard. Respiratory: Effort normal and breath sounds normal. No respiratory distress. He has no wheezes.  GI: Soft. Bowel sounds are normal. He exhibits no distension. There is no tenderness. No rebound Musculoskeletal: He exhibits tenderness.  Minimal edema in LUE.  Neurological: He is alert and oriented.  He is unable to lift his left eye lid.  Right facial weakness with mild to moderate dysarthria and right tongue deviation. Dense right hemiparesis of the UE: 2/5 shoulder, 0/5 distally. RLE: grossly 3/5/5 HF,4-/5 KE, 2+/5 ADF/APF.  5/5 on the left upper and left lower extremity Sensation intact to light touch.   Psychiatric: His affect is blunt. His speech is delayed and slurred. He is slowed.    Assessment/Plan: 1. Functional deficits secondary to TBI with [facility] which require 3+ hours per day of interdisciplinary therapy in a comprehensive inpatient rehab setting. Physiatrist is providing close team supervision and 24 hour management of active medical problems listed below. Physiatrist and rehab team continue to assess barriers to discharge/monitor patient progress toward functional and medical goals.  Function:  Bathing Bathing position Bathing activity did not occur: Refused Position: Shower  Bathing parts Body parts bathed by patient: Right arm, Chest, Abdomen, Front perineal area, Right upper leg, Left upper leg, Right lower leg, Left lower leg, Buttocks, Left arm Body parts bathed by helper: Back  Bathing assist Assist Level: Supervision or verbal cues   Set up : To obtain items  Upper Body Dressing/Undressing Upper body dressing Upper body dressing/undressing activity did not occur: Refused What is the patient wearing?: Pull over shirt/dress     Pull over shirt/dress - Perfomed by patient: Thread/unthread left sleeve, Put head through opening, Pull shirt over trunk, Thread/unthread right sleeve Pull over shirt/dress - Perfomed by helper: Thread/unthread right sleeve        Upper body assist Assist Level: More than reasonable time      Lower Body Dressing/Undressing Lower body dressing Lower body dressing/undressing activity did not occur: Refused What is the patient wearing?: Underwear, Pants, Socks, Shoes, AFO Underwear - Performed by patient: Thread/unthread left underwear leg, Pull underwear up/down, Thread/unthread right underwear leg Underwear - Performed by helper:  Thread/unthread right underwear leg Pants- Performed by patient: Thread/unthread left pants leg, Pull pants up/down, Thread/unthread right pants leg Pants- Performed by helper: Thread/unthread right pants leg Non-skid slipper socks- Performed by patient: Don/doff left sock Non-skid slipper socks-  Performed by helper: Don/doff right sock Socks - Performed by patient: Don/doff right sock, Don/doff left sock Socks - Performed by helper: Don/doff right sock Shoes - Performed by patient: Don/doff right shoe, Don/doff left shoe, Fasten right, Fasten left Shoes - Performed by helper: Don/doff right shoe, Fasten right   AFO - Performed by helper: Don/doff right AFO      Lower body assist Assist for lower body dressing: Supervision or verbal cues      Toileting Toileting   Toileting steps completed by patient: Adjust clothing prior to toileting, Performs perineal hygiene, Adjust clothing after toileting Toileting steps completed by helper: Adjust clothing prior to toileting, Performs perineal hygiene, Adjust clothing after toileting Toileting Assistive Devices: Grab bar or rail  Toileting assist Assist level: No help/no cues   Transfers Chair/bed transfer   Chair/bed transfer method: Stand pivot, Ambulatory Chair/bed transfer assist level: Supervision or verbal cues Chair/bed transfer assistive device: Cane, Orthosis, Armrests     Locomotion Ambulation     Max distance: 180 ft Assist level: Supervision or verbal cues   Wheelchair   Type: Manual Max wheelchair distance: 150 Assist Level: Supervision or verbal cues  Cognition Comprehension Comprehension assist level: Understands basic 90% of the time/cues < 10% of the time  Expression Expression assist level: Expresses basic 90% of the time/requires cueing < 10% of the time.  Social Interaction Social Interaction assist level: Interacts appropriately 75 - 89% of the time - Needs redirection for appropriate language or to initiate interaction.  Problem Solving Problem solving assist level: Solves basic 90% of the time/requires cueing < 10% of the time  Memory Memory assist level: Recognizes or recalls 90% of the time/requires cueing < 10% of the time   Medical Problem List and Plan: 1. Functional deficits secondary to TBI with  [facility] . 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 DVT Prophylaxis/Anticoagulation: Mechanical: Sequential compression devices, below knee Bilateral lower extremities -dopplers -negative 3. Pain Management: Will continue oxycodone prn. encouraged use as needed.   -robaxin prn for spasms - controlled, he has not had to take PRNs. 4. Mood: Monitor as mentation improves. LCSW to follow with patient for evaluation and support.  5. Neuropsych: This patient is not capable of making decisions on his own behalf. 6. Skin/Wound Care: Routine pressure relief measures. Monitor L-eye for resolution of edema.  7. Fluids/Electrolytes/Nutrition: Monitor I/O. continue nutritional supplements   -Labs reviewed Sodium now WNL at 138 on 10/26. 8. Constipation:  Senna scheduled.    9. Urinary retention:Urecholine increased to qid, dose decreased to 5 TID on  10/26, will cont to wean and simultaneously assess bladder function. Continue Flomax. Emptying completely now,    - Continues to void well 10. Left retrobulbar hemorrhage with RAPD: continue observation and conservative care. Pain appears to be under control.   11. Spasticity: Per discussion and coordination with therapies, tone in RLE improved with AFO and is functional at present.  Will not add medications for the time being and continue to monitor as well as encourage ROM.    LOS (Days) 13 A FACE TO FACE EVALUATION WAS PERFORMED  Darienne Belleau Karis Jubanil Deadrian Toya 07/06/2015 8:48 AM

## 2015-07-06 NOTE — Progress Notes (Signed)
Recreational Therapy Session Note  Patient Details  Name: Dakota PartridgeJessie [name] MRN: [id number] Date of Birth: 1985-11-08 Today's Date: 07/06/2015 Late Entry from 07/05/15 Pain: no c/o Skilled Therapeutic Interventions/Progress Updates: Session focused on activity tolerance, RUE WBing, discharge planning, use of leisure time, & dynamic balance.  Pt performed floor transfer with contact guard assist in order to simulate playing with his children on the floor at home.  Pt transitioned to side-lying WBing through RUE with physical assist for WBing from COTA while completing "pipe tree" tasks.  Discussion with pt about use of leisure/free time post discharge.  Pt's father present & observing.  Therapy/Group: Co-Treatment  Yitta Gongaware 07/06/2015, 9:24 AM

## 2015-07-06 NOTE — Patient Care Conference (Signed)
Inpatient RehabilitationTeam Conference and Plan of Care Update Date: 07/05/2015   Time: 11:30 AM    Patient Name: Dakota [name]      Medical Record Number: [id number]  Date of Birth: 25-Jun-1986 Sex: Male         Room/Bed: 4M06C/4M06C-01 Payor Info: Payor: Advertising copywriter / Plan: Advertising copywriter OTHER / Product Type: *No Product type* /    Admitting Diagnosis: TBI , SAH  Admit Date/Time:  06/23/2015  4:20 PM Admission Comments: No comment available   Primary Diagnosis:  <principal problem not specified> Principal Problem: <principal problem not specified>  Patient Active Problem List   Diagnosis Date Noted  . Urinary retention 06/23/2015  . Acute respiratory failure (HCC) 06/23/2015  . Traumatic subarachnoid bleed with LOC of 6 hours to 24 hours (HCC) 06/23/2015  . Right hemiparesis (HCC) 06/23/2015  . Fall 06/22/2015  . Ocular trauma of right eye 06/22/2015  . TBI (traumatic brain injury) (HCC) 06/16/2015    Expected Discharge Date: Expected Discharge Date: 07/14/15  Team Members Present: Physician leading conference: Dr. Maryla Morrow Social Worker Present: Amada Jupiter, LCSW Nurse Present: Chana Bode, RN PT Present: Teodoro Kil, PT;Rebecca Varner, Nita Sickle, PT OT Present: Ardis Rowan, Daneil Dolin, OT SLP Present: Feliberto Gottron, SLP PPS Coordinator present : Tora Duck, RN, CRRN     Current Status/Progress Goal Weekly Team Focus  Medical   TBI with [facility], rIght hemiparesis dense in RUE   Improve weakness, educate on adaptive techniques  See above, monitor electrolytes, wean bladder medications   Bowel/Bladder   Continent of bowel and bladder. LBM 07/04/15.   Remain continent of bowel and bladder with min assist while in rehab.   Offer toileting prn. Encourage use of stool softners.    Swallow/Nutrition/ Hydration   Regular textures with thin liquids, intermittent supervision   Mod I   utilization of swallowing compensatory strategies     ADL's   bathing/dressing-supervision; toilet transfers/toileting-supervision; min verbal cues for safety awareness; trace movement with shoulder flexion  bathing, toilet transfers and toielting, UB dressing-mod I (upgraded); LB dressing-supervision; shower transfers-supervision  RUE NMR, funcitonal transfers, safety awareness, family educaiton   Mobility   supervision overall community distance gait, upgraded to use of SPC from [facility]  upgraded to mod I except supervision comm ambulation and stairs  RUE/RLE NMR, upgrading AD for gait, bracing trials, standing balance, activity tolerance, safety, pt/family education   Communication   Supervision  supervision   speech intelligibility at the conversation level    Safety/Cognition/ Behavioral Observations  Min A-Supervision   supervision   recall, anticipatory awareness, higher-level problem solving, attention    Pain   Occasional c/o pain. Ultram used 4xday.   Less than 4 on a scale of 0-10.   Assess for pain q4 hours and prn. Treat accordingly.    Skin   Left Eye swollen/ bruised. Eritromycin ointment applied.   Remain free of infection and skin breakdown.   Assess skin Q shift and prn. Encourage pressure relief methods to prevent skin breakdown.     Rehab Goals Patient on target to meet rehab goals: Yes *See Care Plan and progress notes for long and short-term goals.  Barriers to Discharge: Non-functional tone, right hemiparesis    Possible Resolutions to Barriers:  Accomodation to deficits and medications for tone if necessary    Discharge Planning/Teaching Needs:  home with wife and father providing 24/7 supervision  Teaching ongoing with wife and father.   Team Discussion:  ADL goals upgraded to  mod i as well as toileting.  Only trace shoulder movement so far.  More engaged and emotive with staff now.  Making excellent gains and on target to meet upgraded goals.    Revisions to Treatment Plan:  Upgraded goals   Continued Need  for Acute Rehabilitation Level of Care: The patient requires daily medical management by a physician with specialized training in physical medicine and rehabilitation for the following conditions: Daily direction of a multidisciplinary physical rehabilitation program to ensure safe treatment while eliciting the highest outcome that is of practical value to the patient.: Yes Daily medical management of patient stability for increased activity during participation in an intensive rehabilitation regime.: Yes Daily analysis of laboratory values and/or radiology reports with any subsequent need for medication adjustment of medical intervention for : Neurological problems  Dakota [name] 07/06/2015, 4:07 PM

## 2015-07-07 ENCOUNTER — Inpatient Hospital Stay (HOSPITAL_COMMUNITY): Payer: 59 | Admitting: Speech Pathology

## 2015-07-07 ENCOUNTER — Inpatient Hospital Stay (HOSPITAL_COMMUNITY): Payer: 59 | Admitting: Physical Therapy

## 2015-07-07 ENCOUNTER — Inpatient Hospital Stay (HOSPITAL_COMMUNITY): Payer: 59

## 2015-07-07 DIAGNOSIS — R258 Other abnormal involuntary movements: Secondary | ICD-10-CM

## 2015-07-07 MED ORDER — TRAMADOL HCL 50 MG PO TABS
50.0000 mg | ORAL_TABLET | Freq: Four times a day (QID) | ORAL | Status: DC
  Administered 2015-07-07 – 2015-07-14 (×20): 50 mg via ORAL
  Filled 2015-07-07 (×23): qty 1

## 2015-07-07 MED ORDER — TAMSULOSIN HCL 0.4 MG PO CAPS
0.4000 mg | ORAL_CAPSULE | Freq: Every day | ORAL | Status: DC
  Administered 2015-07-08 – 2015-07-10 (×3): 0.4 mg via ORAL
  Filled 2015-07-07 (×4): qty 1

## 2015-07-07 NOTE — Progress Notes (Signed)
Occupational Therapy Session Note  Patient Details  Name: Dakota PartridgeJessie [name] MRN: [id number] Date of Birth: Jun 17, 1986  Today's Date: 07/07/2015 OT Individual Time: 0900-1000 OT Individual Time Calculation (min): 60 min    Short Term Goals: Week 2:  OT Short Term Goal 1 (Week 2): Pt will perform tub/shower transfer with supervision OT Short Term Goal 2 (Week 2): Pt will perform LB dressing tasks with supervision OT Short Term Goal 3 (Week 2): Pt wil perform UB dressing tasks with mod I OT Short Term Goal 4 (Week 2): Pt will perform toileting tasks with mod I  Skilled Therapeutic Interventions/Progress Updates:    Pt engaged in BADL retraining including bathing at shower level and dressing with sit<>stand from w/c.  Focus during bathing and dressing tasks on active movement/use of RUE to assist with tasks.  Pt initiating movement but continues to require hand over hand assistance for thoroughness of bathing tasks.  Pt requires max verbal cues for isolated movement with improved quality of movement.  Reviewed unassisted fall from previous night.  Pt stated he failed to lock brakes of w/c before standing.  Reeducated patient on necessity of locking brakes when not propelling w/c.  Pt verbalized understanding but required verbal cues X 4 to lock brakes during morning tasks.    Therapy Documentation Precautions:  Precautions Precautions: Fall Precaution Comments: R sided weakness Restrictions Weight Bearing Restrictions: No Pain: Pain Assessment Pain Assessment: 0-10 Pain Score: 5  Pain Type: Acute pain Pain Location: Shoulder Pain Orientation: Right Pain Descriptors / Indicators:  (strain) Pain Onset: With Activity (NuStep) Pain Intervention(s): Repositioned;Rest (stretch)  See Function Navigator for Current Functional Status.   Therapy/Group: Individual Therapy  [name], [name] 07/07/2015, 11:08 AM

## 2015-07-07 NOTE — Progress Notes (Signed)
Speech Language Pathology Session Note & Discharge Summary  Patient Details  Name: Dakota [name] MRN: [id number] Date of Birth: 20-Jan-1986  Today's Date: 07/07/2015 SLP Individual Time: 1100-1200 SLP Individual Time Calculation (min): 60 min   Skilled Therapeutic Interventions:  Skilled treatment session focused on completion of family education with the patient's mother and father and dysphagia goals. SLP facilitated session by providing handouts and verbal education in regards to patient's current cognitive function and strategies to utilize to maximize recall, attention and overall safety. Patient also consumed his lunch meal of regular textures with thin liquids without overt s/s of aspiration and is Mod I for use of swallowing strategies and tray set-up. Patient was 100% intelligible at the conversation level. Patient appears to be at his cognitive baseline per the patient and family's report, therefore, patient will be discharged from skilled SLP intervention. Both the patient and his family verbalized understanding and agreement. Patient left supine in bed with family present.    Patient has met 5 of 5 long term goals.  Patient to discharge at overall Supervision;Modified Independent level.   Reasons goals not met: N/A   Clinical Impression/Discharge Summary: Patient has made functional gains and has met 5 of 5 LTG's this admission. Currently, patient is demonstrating behaviors consistent with a Rancho Level VIII and requires overall Mod I-supervision to complete mildly complex and familiar tasks safety in regards to memory, problem solving and awareness. Patient is 100% intelligible at the sentence level with Mod I. Patient is also consuming regular textures with thin liquids without overt s/s of aspiration and is Mod I for use of swallowing compensatory strategies. Patient and family education is complete and patient will discharge home with 24 hour supervision. Both the patient and his  family report that he is at his baseline level of cognitive functioning, therefore, skilled SLP intervention is no longer warranted at this time. All verbalized understanding and agreement.   Care Partner:  Caregiver Able to Provide Assistance: Yes  Type of Caregiver Assistance: Physical;Cognitive  Recommendation:  24 hour supervision/assistance;None      Equipment: N/A   Reasons for discharge: Treatment goals met   Patient/Family Agrees with Progress Made and Goals Achieved: Yes   Function:  Eating Eating   Modified Consistency Diet: No Eating Assist Level: No help, No cues           Cognition Comprehension Comprehension assist level: Follows complex conversation/direction with extra time/assistive device  Expression   Expression assist level: Expresses basic needs/ideas: With extra time/assistive device  Social Interaction Social Interaction assist level: Interacts appropriately 90% of the time - Needs monitoring or encouragement for participation or interaction.  Problem Solving Problem solving assist level: Solves complex 90% of the time/cues < 10% of the time  Memory Memory assist level: Recognizes or recalls 90% of the time/requires cueing < 10% of the time   Shawanna Zanders 07/07/2015, 3:49 PM

## 2015-07-07 NOTE — Progress Notes (Signed)
Physical Therapy Session Note  Patient Details  Name: Dakota PartridgeJessie Proano MRN: [id number] Date of Birth: 05-03-1986  Today's Date: 07/07/2015 PT Individual Time: 0800-0900 and 1600-1700 PT Individual Time Calculation (min): 60 min and 60 min  Short Term Goals: Week 3:  PT Short Term Goal 1 (Week 3): = LTGs of overall mod I except S community ambulation and stairs  Skilled Therapeutic Interventions/Progress Updates:   Treatment 1: Patient in wheelchair, wife present initially then departing. Discussed unassisted fall last night with patient and wife reporting that wife was on her way to help patient go to bathroom when patient stood up from unlocked wheelchair and fell backwards. Discussed importance of having someone within arm's reach at this time BEFORE getting up for safety, both verbalized understanding. Gait using SPC 2 x 160 ft with supervision and verbal cues for forward gaze. Patient ambulated using SPC while kicking yoga block with RLE x 200 ft including turns with focus on hip flexion and heel strike in initial swing and initial stance phase with supervision. Neuromuscular re-ed in quadruped with min cues for proper alignment and weightbearing with progression to alternating lower extremity lifts with assistance at R shoulder and L trunk to decrease compensatory trunk rotation progressed to lifting LUE x 3. NuStep using BLE and RUE with hand attachment to assist with grip, level 3 x 3 min with focus on functional use RUE. Patient reporting "muscle strain" in shoulder, performed stretching, patient declined use of ice or heat. Continued with NuStep using BLE only at level 5 x 5 min. Patient ambulated back to room and left sitting in wheelchair with quick release belt on and needs within reach.    Treatment 2: Wife present for session and participated in family training. Patient ambulated throughout rehab unit, on/off elevators, over thresholds, on uneven surfaces, up/down curb step, up/down 2  flights of outdoor brick steps, and on grass surfaces using SPC community distances 2 x >1000 ft with wife providing supervision-min A. Patient required min verbal cues for R knee control to prevent hyperextension and heel strike. Discussed DME needs with [facility] and will [facility] to trial with patient and family declining need for wheelchair at this time. Educated on energy conservation and planning ahead for places to sit/rest in community, both verbalized understanding. Neuromuscular re-education with focus on R hamstring activation-performed seated R knee flexion with pillow case to decrease friction and supine RLE hamstring curls on physioball progressed to BLE hamstring curls on physioball with bridging with focus on knee control and stability. To challenge standing balance and facilitate hip abduction, patient performed sidestepping using SPC while pushing BOSU ball round side up with outside to R and L about 50 ft each direction. Patient left sitting in wheelchair with wife in room. Safety plan updated to clear wife to ambulate with patient in room.   Therapy Documentation Precautions:  Precautions Precautions: Fall Precaution Comments: R sided weakness Restrictions Weight Bearing Restrictions: No Pain: Pain Assessment Pain Assessment: No/denies pain   See Function Navigator for Current Functional Status.   Therapy/Group: Individual Therapy  [name], Shray Hunley A 07/07/2015, 8:52 AM

## 2015-07-07 NOTE — Progress Notes (Signed)
[place] PHYSICAL MEDICINE & REHABILITATION     PROGRESS NOTE    Subjective/Complaints: Pt states he slept well overnight.  He continues to void without difficulty.  He also notes some increased strength in his right ankle.    ROS: Pt denies CP, SOB, n/v/d.   Objective: Vital Signs: Blood pressure 115/65, pulse 83, temperature 98.5 F (36.9 C), temperature source Oral, resp. rate 17, weight 85.775 kg (189 lb 1.6 oz), SpO2 100 %. No results found.  Recent Labs  07/06/15 0532  WBC 8.6  HGB 13.1  HCT 40.1  PLT 289    Recent Labs  07/06/15 0532  NA 138  K 4.1  CL 100*  GLUCOSE 109*  BUN 12  CREATININE 0.76  CALCIUM 9.7   CBG (last 3)  No results for input(s): GLUCAP in the last 72 hours.  Wt Readings from Last 3 Encounters:  07/06/15 85.775 kg (189 lb 1.6 oz)  06/20/15 91.7 kg (202 lb 2.6 oz)   upper extremity.  Physical Exam:  Constitutional: He appears well-developed and well-nourished. Flat affect. Alert.    HENT:  Head: Normocephalic. Left eye and face trauma Eyes: Right eye exhibits no discharge.  Left ptosis. Right - normal in appearance  Neck: Normal range of motion. Neck supple.  Cardiovascular: Normal rate and regular rhythm.  No murmur heard. Respiratory: Effort normal and breath sounds normal. No respiratory distress. He has no wheezes.  GI: Soft. Bowel sounds are normal. He exhibits no distension. There is no tenderness. No rebound Musculoskeletal: He exhibits tenderness.  Minimal edema in LUE.  Neurological: He is alert and oriented.  He is unable to lift his left eye lid.  Right facial weakness with mild to moderate dysarthria and right tongue deviation. Dense right hemiparesis of the UE: 2/5 shoulder, 0/5 distally. RLE: grossly 3/5/5 HF,4-/5 KE, 2+/5 ADF/APF.  5/5 on the left upper and left lower extremity Sensation intact to light touch.   Psychiatric: His affect is blunt. His speech is delayed and slurred. He is slowed.    Assessment/Plan: 1. Functional deficits secondary to TBI with [facility] which require 3+ hours per day of interdisciplinary therapy in a comprehensive inpatient rehab setting. Physiatrist is providing close team supervision and 24 hour management of active medical problems listed below. Physiatrist and rehab team continue to assess barriers to discharge/monitor patient progress toward functional and medical goals.  Function:  Bathing Bathing position Bathing activity did not occur: Refused Position: Shower  Bathing parts Body parts bathed by patient: Right arm, Chest, Abdomen, Front perineal area, Right upper leg, Left upper leg, Right lower leg, Left lower leg, Buttocks, Left arm Body parts bathed by helper: Back  Bathing assist Assist Level: Supervision or verbal cues   Set up : To obtain items  Upper Body Dressing/Undressing Upper body dressing Upper body dressing/undressing activity did not occur: Refused What is the patient wearing?: Pull over shirt/dress     Pull over shirt/dress - Perfomed by patient: Thread/unthread left sleeve, Put head through opening, Pull shirt over trunk, Thread/unthread right sleeve Pull over shirt/dress - Perfomed by helper: Thread/unthread right sleeve        Upper body assist Assist Level: More than reasonable time      Lower Body Dressing/Undressing Lower body dressing Lower body dressing/undressing activity did not occur: Refused What is the patient wearing?: Underwear, Pants, Socks, Shoes, AFO Underwear - Performed by patient: Thread/unthread left underwear leg, Pull underwear up/down, Thread/unthread right underwear leg Underwear - Performed by helper:  Thread/unthread right underwear leg Pants- Performed by patient: Thread/unthread left pants leg, Pull pants up/down, Thread/unthread right pants leg Pants- Performed by helper: Thread/unthread right pants leg Non-skid slipper socks- Performed by patient: Don/doff left sock Non-skid slipper socks-  Performed by helper: Don/doff right sock Socks - Performed by patient: Don/doff right sock, Don/doff left sock Socks - Performed by helper: Don/doff right sock Shoes - Performed by patient: Don/doff right shoe, Don/doff left shoe, Fasten right, Fasten left Shoes - Performed by helper: Don/doff right shoe, Fasten right AFO - Performed by patient: Don/doff right AFO AFO - Performed by helper: Don/doff right AFO      Lower body assist Assist for lower body dressing: Supervision or verbal cues      Toileting Toileting   Toileting steps completed by patient: Adjust clothing prior to toileting, Performs perineal hygiene, Adjust clothing after toileting Toileting steps completed by helper: Adjust clothing prior to toileting, Performs perineal hygiene, Adjust clothing after toileting Toileting Assistive Devices: Grab bar or rail  Toileting assist Assist level: No help/no cues   Transfers Chair/bed transfer   Chair/bed transfer method: Ambulatory Chair/bed transfer assist level: Supervision or verbal cues Chair/bed transfer assistive device: Cane, Orthosis, Armrests     Locomotion Ambulation     Max distance: 180 ft Assist level: Supervision or verbal cues   Wheelchair   Type: Manual Max wheelchair distance: 150 Assist Level: Supervision or verbal cues  Cognition Comprehension Comprehension assist level: Understands complex 90% of the time/cues 10% of the time  Expression Expression assist level: Expresses basic needs/ideas: With extra time/assistive device  Social Interaction Social Interaction assist level: Interacts appropriately 90% of the time - Needs monitoring or encouragement for participation or interaction.  Problem Solving Problem solving assist level: Solves complex 90% of the time/cues < 10% of the time  Memory Memory assist level: Recognizes or recalls 90% of the time/requires cueing < 10% of the time   Medical Problem List and Plan: 1. Functional deficits secondary  to TBI with SAH . Rght hemiparesis remains dense in UE    -Cont CIR therapies  -utilizing right AFO well with gait/balance 2. DVT Prophylaxis/Anticoagulation: Mechanical: Sequential compression devices, below knee Bilateral lower extremities -dopplers -negative 3. Pain Management: Will continue oxycodone prn. encouraged use as needed.   -robaxin prn for spasms - controlled, he has not had to take PRNs. 4. Mood: Monitor as mentation improves. LCSW to follow with patient for evaluation and support.  5. Neuropsych: This patient is not capable of making decisions on his own behalf. 6. Skin/Wound Care: Routine pressure relief measures. Monitor L-eye for resolution of edema.  7. Fluids/Electrolytes/Nutrition: Monitor I/O. continue nutritional supplements   -Sodium now WNL at 138 on 10/26. 8. Constipation:  Senna scheduled.    9. Urinary retention:Urecholine---dc. pvr's 0.  -Continue Flomax for time being     10. Left retrobulbar hemorrhage with RAPD: eye clinically improved.. Pain appears to be under control.   -demonstrates improvement in medial movement and substantially with lateral movement---still with significant ptosis  11. Spasticity:   tone in RLE improved with AFO and increase mobility, stretching with therapies-  -no medication required at this time LOS (Days) 14 A FACE TO FACE EVALUATION WAS PERFORMED  SWARTZ,ZACHARY T 07/07/2015 9:54 AM

## 2015-07-07 NOTE — Progress Notes (Signed)
Occupational Therapy Note  Patient Details  Name: Dakota PartridgeJessie [name] MRN: [id number] Date of Birth: 15-Apr-1986  Today's Date: 07/07/2015 OT Individual Time: 1400-1430 OT Individual Time Calculation (min): 30 min   Pt denied pain Individual therapy  Pt engaged in RUE therapeutic activities in side lying and sitting EOM in gym. Focus on active scapula retraction/protraction, scapula elevation/depression, shoulder flexion/extension, shoulder abduction/adduction, elbow flexion/extension, and Rt hand grasp/release.  Pt requires max verbal cues to focus on isolation of movement and decrease recruitment during movement.  Pt continues to exhibit increased active movement during above activities.  Pt returned to room and remained in w/c with wife present.    Lavone NeriLanier, Charlotta Lapaglia [facility] LLCChappell 07/07/2015, 2:54 PM

## 2015-07-08 ENCOUNTER — Inpatient Hospital Stay (HOSPITAL_COMMUNITY): Payer: 59 | Admitting: Physical Therapy

## 2015-07-08 ENCOUNTER — Inpatient Hospital Stay (HOSPITAL_COMMUNITY): Payer: 59

## 2015-07-08 MED ORDER — BACLOFEN 10 MG PO TABS
10.0000 mg | ORAL_TABLET | Freq: Two times a day (BID) | ORAL | Status: DC
  Administered 2015-07-08 – 2015-07-14 (×13): 10 mg via ORAL
  Filled 2015-07-08 (×13): qty 1

## 2015-07-08 NOTE — Progress Notes (Signed)
Physical Therapy Session Note  Patient Details  Name: Dakota PartridgeJessie [name] MRN: [id number] Date of Birth: [birth year]  Today's Date: 07/08/2015 PT Individual Time: 1100-1200 and 1515-1630 PT Individual Time Calculation (min): 60 min and 75 min  Short Term Goals: Week 3:  PT Short Term Goal 1 (Week 3): = LTGs of overall mod I except S community ambulation and stairs  Skilled Therapeutic Interventions/Progress Updates:   Treatment 1: Gait from room to/from gym without AD and R posterior leaf spring AFO with heel wedge donned with close supervision, min cues for forward gaze and mod cues for knee control to prevent hyperextension occuring approx 25% of time. Stair training up/down 22 stairs using L rail with supervision, step-to and reciprocal pattern. Gait with AFO doffed, initial use of SPC progressed to no AD with decreased R knee control 50% of time, max cues to prevent hyperextension. RLE neuromuscular re-ed with LLE on 6" step, performing sit <> stand with focus on RLE weightbearing and quad control to fatigue with mirror for visual feedback and verbal feedback. Attempted sit <> stand from raised mat using RLE only with mod A and decreased eccentric control. Single limb stance on RLE with mod A for balance and verbal/tactile cues to prevent R knee hyperextension in stance. To facilitate ankle/hip balance strategies, standing on foam pad performed perturbations varying in resistance, speed, location and direction (hips, shoulders, trunk on diagonals and forward/backward) and duration with only 1 LOB requiring assist to recover. Patient utilized athletic stance with knees slightly flexed to maintain standing balance and lower COG. Seated edge of mat, utilized UE ranger with RUE for assisted shoulder flexion/extension and horizontal abduction/adduction. Patient ambulated back to room and left sitting in wheelchair with dad present.   Treatment 2: Gait to/from gym with AFO donned using SPC on return due to  increased LLE muscle fatigue, supervision. Session focused on R sided neuromuscular re-ed and activity tolerance, see details below. Patient left sitting in wheelchair with family present.   Therapy Documentation Precautions:  Precautions Precautions: Fall Precaution Comments: R sided weakness Restrictions Weight Bearing Restrictions: No Pain: Pain Assessment Pain Assessment: No/denies pain Other Treatments: Treatments Neuromuscular Facilitation: Right;Lower Extremity;Forced use;Activity to increase motor control;Activity to increase timing and sequencing;Activity to increase grading;Activity to increase sustained activation;Activity to increase lateral weight shifting ambulating without AD with focus on hip/knee flexion while kicking yoga block with RLE x 200 ft, squats from slightly raised mat with reaching task to place retrieve objects to target on low bench to facilitate hip hinge, hip clearance, and maintain lumbar extension, seated without BLE support reaching for cards under L hip with LUE to facilitate L lateral trunk shortening/R lateral trunk elongation andand placing cards to target outside BOS to L to facilitate L lateral trunk elongation and R lateral trunk shortening, squats on BOSU ball flat side up in parallel bars with hand over hand assist to maintain RUE weightbearing and tactile cues to keep hips back and prevent knees going forward over toes, and Kinetron seated > standing with RUE ace wrapped to bar for RUE weightbearing as able with focus on R knee control, controlled movement, and lateral weight shifting, mirror anterior for visual feedback to promote forward gaze and upright posture.    See Function Navigator for Current Functional Status.   Therapy/Group: Individual Therapy  [name], Caysie Minnifield A 07/08/2015, 12:30 PM

## 2015-07-08 NOTE — Progress Notes (Signed)
Occupational Therapy Note  Patient Details  Name: Dakota PartridgeJessie [name] MRN: [id number] Date of Birth: 1985/10/29  Today's Date: 07/08/2015 OT Individual Time: 8413-24400830-0930 OT Individual Time Calculation (min): 60 min   Pt denied pain Individual therapy  Pt engaged in w/c mobility, path finding,  and dynamic standing activities. Pt propelled w/c from unit to [facility] while navigating uneven surfaces and inclines.  Pt stood without UE support for approx 15 mins without rest to hand out candy to young children dressed in halloween costumes.  Pt interacted with children appropriately throughout activity.  Pt was able to correctly find unit and room at end of session.     Lavone NeriLanier, Jesica Goheen [facility] 07/08/2015, 9:58 AM

## 2015-07-08 NOTE — Progress Notes (Signed)
Occupational Therapy Note  Patient Details  Name: Dakota [name] MRN: [id number] Date of Birth: July 04, 1986  Today's Date: 07/08/2015 OT Individual Time: 1330-1430 OT Individual Time Calculation (min): 60 min   Pt denied pain Individual therapy  Pt engaged in BADL retraining including bathing at shower level and dressing with sit<>stand from w/c.  Focus on functional amb with SPC (without AFO) into/out of bathroom with emphasis on lifting RLE adequately to navigate uneven surfaces safely.  Pt completed bathing and dressing tasks at supervision level.  Pt transitioned to therapy gym and engaged in RUE activities/tasks with focus on isolated movements at shoulder and elbow.  Pt engaged in sidelying with feet over edge of mat and pushing up through RUE.  Pt was able to perform task with mod A.     Lavone NeriLanier, Litzi Binning [facility] 07/08/2015, 2:45 PM

## 2015-07-08 NOTE — Progress Notes (Signed)
PHYSICAL MEDICINE & REHABILITATION     PROGRESS NOTE    Subjective/Complaints: Has questions about left eye lid. Pain under control emptying bladder. Walked a good amount with therapy. Left leg tight. just took of PRAFO.    ROS: Pt denies CP, SOB, n/v/d.   Objective: Vital Signs: Blood pressure 101/62, pulse 75, temperature 98.5 F (36.9 C), temperature source Oral, resp. rate 18, weight 85.775 kg (189 lb 1.6 oz), SpO2 98 %. No results found.  Recent Labs  07/06/15 0532  WBC 8.6  HGB 13.1  HCT 40.1  PLT 289    Recent Labs  07/06/15 0532  NA 138  K 4.1  CL 100*  GLUCOSE 109*  BUN 12  CREATININE 0.76  CALCIUM 9.7   CBG (last 3)  No results for input(s): GLUCAP in the last 72 hours.  Wt Readings from Last 3 Encounters:  07/06/15 85.775 kg (189 lb 1.6 oz)  06/20/15 91.7 kg (202 lb 2.6 oz)   upper extremity.  Physical Exam:  Constitutional: He appears well-developed and well-nourished. Flat affect. Alert.    HENT:  Head: Normocephalic. Left eye and face trauma Eyes: Right eye exhibits no discharge.  Left ptosis. Right - normal in appearance  Neck: Normal range of motion. Neck supple.  Cardiovascular: Normal rate and regular rhythm.  No murmur heard. Respiratory: Effort normal and breath sounds normal. No respiratory distress. He has no wheezes.  GI: Soft. Bowel sounds are normal. He exhibits no distension. There is no tenderness. No rebound Musculoskeletal: He exhibits tenderness.  Minimal edema in LUE.  Neurological: He is alert and oriented.  He is unable to lift his left eye lid.  Right facial weakness with mild to moderate dysarthria and right tongue deviation. Dense right hemiparesis of the UE: 2-/5 shoulder,  1+ biceps, triceps, 0/5 distally. RLE: grossly 3/5/5 HF,4-/5 KE, 2+/5 ADF/APF. Extensor tone 1+ RLE prox>distal. Tight heel cord 5/5 on the left upper and left lower extremity Sensation intact to light touch.   Psychiatric: His  affect is blunt. His speech is delayed and slurred. He is slowed.   Assessment/Plan: 1. Functional deficits secondary to TBI with [facility] which require 3+ hours per day of interdisciplinary therapy in a comprehensive inpatient rehab setting. Physiatrist is providing close team supervision and 24 hour management of active medical problems listed below. Physiatrist and rehab team continue to assess barriers to discharge/monitor patient progress toward functional and medical goals.  Function:  Bathing Bathing position Bathing activity did not occur: Refused Position: Shower  Bathing parts Body parts bathed by patient: Right arm, Chest, Abdomen, Front perineal area, Right upper leg, Left upper leg, Right lower leg, Left lower leg, Buttocks, Left arm Body parts bathed by helper: Back  Bathing assist Assist Level: Supervision or verbal cues   Set up : To obtain items  Upper Body Dressing/Undressing Upper body dressing Upper body dressing/undressing activity did not occur: Refused What is the patient wearing?: Pull over shirt/dress     Pull over shirt/dress - Perfomed by patient: Thread/unthread left sleeve, Put head through opening, Pull shirt over trunk, Thread/unthread right sleeve Pull over shirt/dress - Perfomed by helper: Thread/unthread right sleeve        Upper body assist Assist Level: More than reasonable time      Lower Body Dressing/Undressing Lower body dressing Lower body dressing/undressing activity did not occur: Refused What is the patient wearing?: Underwear, Pants, Socks, Shoes, AFO Underwear - Performed by patient: Thread/unthread left underwear leg, Pull  underwear up/down, Thread/unthread right underwear leg Underwear - Performed by helper: Thread/unthread right underwear leg Pants- Performed by patient: Thread/unthread left pants leg, Pull pants up/down, Thread/unthread right pants leg Pants- Performed by helper: Thread/unthread right pants leg Non-skid slipper socks-  Performed by patient: Don/doff left sock Non-skid slipper socks- Performed by helper: Don/doff right sock Socks - Performed by patient: Don/doff right sock, Don/doff left sock Socks - Performed by helper: Don/doff right sock Shoes - Performed by patient: Don/doff right shoe, Don/doff left shoe, Fasten right, Fasten left Shoes - Performed by helper: Don/doff right shoe, Fasten right AFO - Performed by patient: Don/doff right AFO AFO - Performed by helper: Don/doff right AFO      Lower body assist Assist for lower body dressing: Supervision or verbal cues      Toileting Toileting   Toileting steps completed by patient: Adjust clothing prior to toileting, Performs perineal hygiene, Adjust clothing after toileting Toileting steps completed by helper: Adjust clothing prior to toileting, Performs perineal hygiene, Adjust clothing after toileting Toileting Assistive Devices: Grab bar or rail  Toileting assist Assist level: No help/no cues   Transfers Chair/bed transfer   Chair/bed transfer method: Ambulatory Chair/bed transfer assist level: Supervision or verbal cues Chair/bed transfer assistive device: Cane, Orthosis, Armrests     Locomotion Ambulation     Max distance: 180 ft Assist level: Supervision or verbal cues   Wheelchair   Type: Manual Max wheelchair distance: 150 Assist Level: Supervision or verbal cues  Cognition Comprehension Comprehension assist level: Follows complex conversation/direction with extra time/assistive device  Expression Expression assist level: Expresses basic needs/ideas: With extra time/assistive device  Social Interaction Social Interaction assist level: Interacts appropriately 90% of the time - Needs monitoring or encouragement for participation or interaction.  Problem Solving Problem solving assist level: Solves complex 90% of the time/cues < 10% of the time  Memory Memory assist level: Recognizes or recalls 90% of the time/requires cueing < 10%  of the time   Medical Problem List and Plan: 1. 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 Rght hemiparesis remains dense in UE    -Cont CIR therapies  -utilizing right AFO well with gait/balance 2. DVT Prophylaxis/Anticoagulation: Mechanical: Sequential compression devices, below knee Bilateral lower extremities -dopplers -negative 3. Pain Management: Will continue oxycodone prn. encouraged use as needed.   -robaxin prn for spasms -  4. Mood: Monitor as mentation improves. LCSW to follow with patient for evaluation and support.  5. Neuropsych: This patient is not capable of making decisions on his own behalf. 6. Skin/Wound Care: Routine pressure relief measures. Monitor L-eye for resolution of edema.  7. Fluids/Electrolytes/Nutrition: Monitor I/O. continue nutritional supplements   -Sodium now WNL at 138 on 10/26. 8. Constipation:  Senna scheduled.    9. Urinary retention:Urecholine stopped due to improved emptying  -Continue Flomax for time being     10. Left retrobulbar hemorrhage with RAPD: eye clinically improved.. Pain appears to be under control.   -demonstrates improvement in medial movement and substantially with lateral movement---still with significant ptosis  -reviewed plan/recovery at this point. Prognosis is guarded at best  11. Spasticity:   tone in RLE improved with AFO and increase mobility, stretching with therapies-  -will initiate baclofen for extensor tone in RLE. Needs to work on stretching also LOS (Days) 15 A FACE TO FACE EVALUATION WAS PERFORMED  Lenaya Pietsch T 07/08/2015 8:38 AM

## 2015-07-08 NOTE — Progress Notes (Signed)
Occupational Therapy Weekly Progress Note  Patient Details  Name: Dakota [name] MRN: [id number] Date of Birth: July 19, 1986  Beginning of progress report period: July 01, 2015 End of progress report period: July 08, 2015  Patient has met 4 of 4 short term goals.  Pt continues to make steady progress with BADLs.  Pt requires min verbal cues for safety awareness, primarily with cues to lock brakes on w/c before standing and when not moving.  Pt incorporates compensatory strategies with all bathing and dressing tasks.  Pt is exhibiting increased RUE scapula retraction/protraction/elevation/depression as well as shoulder flexion/extension, elbow flexion/extension, and grasp/release.  Pt's father and wife have been present for the majority of therapy sessions.  Pt exhibits behaviors consistent with [place] Level [place]. Patient continues to demonstrate the following deficits:muscle weakness, decreased cardiorespiratoy endurance, impaired timing and sequencing, abnormal tone, unbalanced muscle activation, decreased coordination and decreased motor planning, decreased motor planning, decreased safety awareness and decreased standing balance, decreased postural control, decreased balance strategies and difficulty maintaining precautions and therefore will continue to benefit from skilled OT intervention to enhance overall performance with BADL.  Patient progressing toward long term goals..  Continue plan of care.  OT Short Term Goals Week 2:  OT Short Term Goal 1 (Week 2): Pt will perform tub/shower transfer with supervision OT Short Term Goal 1 - Progress (Week 2): Met OT Short Term Goal 2 (Week 2): Pt will perform LB dressing tasks with supervision OT Short Term Goal 2 - Progress (Week 2): Met OT Short Term Goal 3 (Week 2): Pt wil perform UB dressing tasks with mod I OT Short Term Goal 3 - Progress (Week 2): Met OT Short Term Goal 4 (Week 2): Pt will perform toileting tasks with mod I OT Short Term  Goal 4 - Progress (Week 2): Met Week 3:  OT Short Term Goal 1 (Week 3): STG=LTG secondary to ELOS   Therapy Documentation Precautions:  Precautions Precautions: Fall Precaution Comments: R sided weakness Restrictions Weight Bearing Restrictions: No  See Function Navigator for Current Functional Status.   [name] [facility] 07/08/2015, 6:53 AM

## 2015-07-09 ENCOUNTER — Inpatient Hospital Stay (HOSPITAL_COMMUNITY): Payer: 59 | Admitting: Physical Therapy

## 2015-07-09 DIAGNOSIS — G8111 Spastic hemiplegia affecting right dominant side: Secondary | ICD-10-CM

## 2015-07-09 NOTE — Progress Notes (Signed)
Physical Therapy Session Note  Patient Details  Name: Dakota [name] MRN: [id number] Date of Birth: 05/30/1986  Today's Date: 07/09/2015 PT Individual Time: 1000-1100 PT Individual Time Calculation (min): 60 min   Short Term Goals: Week 3:  PT Short Term Goal 1 (Week 3): = LTGs of overall mod I except S community ambulation and stairs  Skilled Therapeutic Interventions/Progress Updates:   Session focused on RLE neuromuscular re-education with HEP and family education with wife present for session. Patient ambulated 2 x 180 ft with supervision and intermittent use of SPC, patient demonstrated decreased fluidity of gait and increased dependence on cane compared to yesterday due to LLE fatigue per patient.   Patient instructed in supine/sidelying/seated HEP for RLE with AFO doffed: ankle pumps, heel slides, sidelying hip abduction with therapist blocking patient from rolling supine to decrease compensatory hip flexion movement, quad sets, glute sets, seated LAQ, each exercise to fatigue.   Performed D1 RLE flexion/extension PNF pattern to fatigue with max verbal/tactile cues for sustained activation with focus on ankle DF, hip abduction, and knee control.   Standing HEP Level B OTAGO exercises using countertop for LUE support: tandem stance leading with BLE, single leg stance with focus on R knee control in stance and open chain R hip flexion or knee flexion during LLE stance, and squats with decreasing UE support and increased challenge with eyes open progressed to eyes closed, backwards gait, and sidestepping to R and L.   Family training with wife guarding patient in stairwell, negotiating up/down flight of stairs using L rail with supervision. Wife continues to be hesitant with assisting patient on stairs as she feels as though she could not "catch" him if he were to lose balance, educated on need to provide feedback for step placement, controlled movement, etc to prevent patient from  experiencing LOB, will benefit from continued hands-on training.   Patient and wife with continued questions about patching R eye, discussed left eye visual tracking exercises in dim room due to dilated pupil with R eye covered, currently not recommending patching R eye as patient does not have functional use of left eye vision at this time and is at risk for falls if he were to ambulate with R eye patched. Patient and wife provided handouts of HEP with no further questions at this time. Patient left sitting in wheelchair with wife present.    Therapy Documentation Precautions:  Precautions Precautions: Fall Precaution Comments: R sided weakness Restrictions Weight Bearing Restrictions: No Pain: Pain Assessment Pain Assessment: No/denies pain   See Function Navigator for Current Functional Status.   Therapy/Group: Individual Therapy  [name], [name] 07/09/2015, 12:44 PM

## 2015-07-09 NOTE — Progress Notes (Signed)
[place] PHYSICAL MEDICINE & REHABILITATION     PROGRESS NOTE    Subjective/Complaints: Slept quite well. Pain minimal. Had no problems with baclofen.   ROS: Pt denies CP, SOB, n/v/d.   Objective: Vital Signs: Blood pressure 106/58, pulse 75, temperature 98.7 F (37.1 C), temperature source Oral, resp. rate 18, weight 85.775 kg (189 lb 1.6 oz), SpO2 96 %. No results found. No results for input(s): WBC, HGB, HCT, PLT in the last 72 hours. No results for input(s): NA, K, CL, GLUCOSE, BUN, CREATININE, CALCIUM in the last 72 hours.  Invalid input(s): CO CBG (last 3)  No results for input(s): GLUCAP in the last 72 hours.  Wt Readings from Last 3 Encounters:  07/06/15 85.775 kg (189 lb 1.6 oz)  06/20/15 91.7 kg (202 lb 2.6 oz)   upper extremity.  Physical Exam:  Constitutional: He appears well-developed and well-nourished. Flat affect. Alert.    HENT:  Head: Normocephalic. Left eye and face trauma Eyes: Right eye exhibits no discharge.  Left ptosis. Right - normal in appearance  Neck: Normal range of motion. Neck supple.  Cardiovascular: Normal rate and regular rhythm.  No murmur heard. Respiratory: Effort normal and breath sounds normal. No respiratory distress. He has no wheezes.  GI: Soft. Bowel sounds are normal. He exhibits no distension. There is no tenderness. No rebound Musculoskeletal: He exhibits tenderness.  Minimal edema in LUE.  Neurological: He is alert and oriented.  He is unable to lift his left eye lid. Left pupil dilated. Improved lateral>medial movement of eye.  Right facial weakness with mild to moderate dysarthria and right tongue deviation. Dense right hemiparesis of the UE: 2-/5 shoulder,  1+ biceps, triceps, 0/5 distally. RLE: grossly 3/5/5 HF,4-/5 KE, 2+/5 ADF/APF. Extensor tone 1+ RLE prox>distal. Tight heel cord 5/5 on the left upper and left lower extremity Sensation intact to light touch.   Psychiatric: His affect is blunt. His speech is  delayed and slurred. He is slowed.   Assessment/Plan: 1. [facility]+ hours per day of interdisciplinary therapy in a comprehensive inpatient rehab setting. Physiatrist is providing close team supervision and 24 hour management of active medical problems listed below. Physiatrist and rehab team continue to assess barriers to discharge/monitor patient progress toward functional and medical goals.  Function:  Bathing Bathing position Bathing activity did not occur: Refused Position: Shower  Bathing parts Body parts bathed by patient: Right arm, Chest, Abdomen, Front perineal area, Right upper leg, Left upper leg, Right lower leg, Left lower leg, Buttocks, Left arm Body parts bathed by helper: Back  Bathing assist Assist Level: Supervision or verbal cues   Set up : To obtain items  Upper Body Dressing/Undressing Upper body dressing Upper body dressing/undressing activity did not occur: Refused What is the patient wearing?: Pull over shirt/dress     Pull over shirt/dress - Perfomed by patient: Thread/unthread left sleeve, Put head through opening, Pull shirt over trunk, Thread/unthread right sleeve Pull over shirt/dress - Perfomed by helper: Thread/unthread right sleeve        Upper body assist Assist Level: More than reasonable time      Lower Body Dressing/Undressing Lower body dressing Lower body dressing/undressing activity did not occur: Refused What is the patient wearing?: Underwear, Pants, Socks, Shoes, AFO Underwear - Performed by patient: Thread/unthread left underwear leg, Pull underwear up/down, Thread/unthread right underwear leg Underwear - Performed by helper: Thread/unthread right underwear leg Pants- Performed by patient: Thread/unthread left pants leg,  Pull pants up/down, Thread/unthread right pants leg Pants- Performed by helper: Thread/unthread right pants leg Non-skid slipper socks- Performed by patient: Don/doff  left sock Non-skid slipper socks- Performed by helper: Don/doff right sock Socks - Performed by patient: Don/doff right sock, Don/doff left sock Socks - Performed by helper: Don/doff right sock Shoes - Performed by patient: Don/doff right shoe, Don/doff left shoe, Fasten right, Fasten left Shoes - Performed by helper: Don/doff right shoe, Fasten right AFO - Performed by patient: Don/doff right AFO AFO - Performed by helper: Don/doff right AFO      Lower body assist Assist for lower body dressing: Supervision or verbal cues      Toileting Toileting   Toileting steps completed by patient: Adjust clothing prior to toileting, Performs perineal hygiene, Adjust clothing after toileting Toileting steps completed by helper: Adjust clothing prior to toileting, Performs perineal hygiene, Adjust clothing after toileting Toileting Assistive Devices: Grab bar or rail  Toileting assist Assist level: No help/no cues   Transfers Chair/bed transfer   Chair/bed transfer method: Ambulatory Chair/bed transfer assist level: Supervision or verbal cues Chair/bed transfer assistive device: Cane, Orthosis, Armrests     Locomotion Ambulation     Max distance: 180 ft Assist level: Supervision or verbal cues   Wheelchair   Type: Manual Max wheelchair distance: 150 Assist Level: Supervision or verbal cues  Cognition Comprehension Comprehension assist level: Follows complex conversation/direction with extra time/assistive device  Expression Expression assist level: Expresses basic needs/ideas: With extra time/assistive device  Social Interaction Social Interaction assist level: Interacts appropriately 90% of the time - Needs monitoring or encouragement for participation or interaction.  Problem Solving Problem solving assist level: Solves complex 90% of the time/cues < 10% of the time  Memory Memory assist level: Recognizes or recalls 90% of the time/requires cueing < 10% of the time   Medical Problem  List and Plan: 1. 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 Rght hemiparesis remains dense in UE    -Cont CIR therapies  -utilizing right AFO well with gait/balance 2. DVT Prophylaxis/Anticoagulation: Mechanical: Sequential compression devices, below knee Bilateral lower extremities -dopplers -negative 3. Pain Management: Will continue oxycodone prn. encouraged use as needed.   -robaxin prn for spasms -  4. Mood: Monitor as mentation improves. LCSW to follow with patient for evaluation and support.  5. Neuropsych: This patient is not capable of making decisions on his own behalf. 6. Skin/Wound Care: Routine pressure relief measures. Monitor L-eye for resolution of edema.  7. Fluids/Electrolytes/Nutrition: Monitor I/O. continue nutritional supplements   -Sodium now WNL at 138 on 10/26. 8. Constipation:  Senna scheduled.    9. Urinary retention:Urecholine stopped--without issue  -Continue Flomax for time being     10. Left retrobulbar hemorrhage with RAPD: eye clinically improved.. Pain appears to be under control.   -demonstrates improvement in medial movement and substantially with lateral movement---still with significant ptosis  -reviewed plan/recovery at this point. Prognosis is guarded at best  11. Spasticity:   tone in RLE improved with AFO and increase mobility, stretching with therapies-  -continue baclofen for extensor tone in RLE. Impressed upon him the importance of stretching also LOS (Days) 16 A FACE TO FACE EVALUATION WAS PERFORMED  Dakota [name] T 07/09/2015 8:23 AM

## 2015-07-10 ENCOUNTER — Inpatient Hospital Stay (HOSPITAL_COMMUNITY): Payer: 59 | Admitting: Physical Therapy

## 2015-07-10 NOTE — Progress Notes (Signed)
[place] PHYSICAL MEDICINE & REHABILITATION     PROGRESS NOTE    Subjective/Complaints: Sleeping soundly when i entered. No problems yesterday. Father had questions about left eye.  ROS: Pt denies CP, SOB, n/v/d.   Objective: Vital Signs: Blood pressure 112/65, pulse 82, temperature 98.7 F (37.1 C), temperature source Oral, resp. rate 20, weight 85.775 kg (189 lb 1.6 oz), SpO2 96 %. No results found. No results for input(s): WBC, HGB, HCT, PLT in the last 72 hours. No results for input(s): NA, K, CL, GLUCOSE, BUN, CREATININE, CALCIUM in the last 72 hours.  Invalid input(s): CO CBG (last 3)  No results for input(s): GLUCAP in the last 72 hours.  Wt Readings from Last 3 Encounters:  07/06/15 85.775 kg (189 lb 1.6 oz)  06/20/15 91.7 kg (202 lb 2.6 oz)   upper extremity.  Physical Exam:  Constitutional: He appears well-developed and well-nourished. Flat affect. Alert.    HENT:  Head: Normocephalic. Left eye and face trauma Eyes: Right eye exhibits no discharge.  Left ptosis. Right - normal in appearance. Bruising essentially resolved  Neck: Normal range of motion. Neck supple.  Cardiovascular: Normal rate and regular rhythm.  No murmur heard. Respiratory: Effort normal and breath sounds normal. No respiratory distress. He has no wheezes.  GI: Soft. Bowel sounds are normal. He exhibits no distension. There is no tenderness. No rebound Musculoskeletal: He exhibits tenderness.  Minimal edema in LUE.  Neurological: He is alert and oriented.  He is unable to lift his left eye lid. Left pupil dilated. Improved lateral>medial movement of eye.  Right facial weakness with mild to moderate dysarthria and right tongue deviation. Dense right hemiparesis of the UE: 2-/5 shoulder,  1+ biceps, triceps, 0/5 distally. RLE: grossly 3/5/5 HF,4-/5 KE, 2+/5 ADF/APF. Extensor tone 1+ RLE prox>distal. Tight heel cord 5/5 on the left upper and left lower extremity Sensation intact to  light touch.   Psychiatric: His affect is blunt. His speech is delayed and slurred. He is slowed.   Assessment/Plan: 1. [facility] 3+ hours per day of interdisciplinary therapy in a comprehensive inpatient rehab setting. Physiatrist is providing close team supervision and 24 hour management of active medical problems listed below. Physiatrist and rehab team continue to assess barriers to discharge/monitor patient progress toward functional and medical goals.  Function:  Bathing Bathing position Bathing activity did not occur: Refused Position: Shower  Bathing parts Body parts bathed by patient: Right arm, Chest, Abdomen, Front perineal area, Right upper leg, Left upper leg, Right lower leg, Left lower leg, Buttocks, Left arm Body parts bathed by helper: Back  Bathing assist Assist Level: Supervision or verbal cues   Set up : To obtain items  Upper Body Dressing/Undressing Upper body dressing Upper body dressing/undressing activity did not occur: Refused What is the patient wearing?: Pull over shirt/dress     Pull over shirt/dress - Perfomed by patient: Thread/unthread left sleeve, Put head through opening, Pull shirt over trunk, Thread/unthread right sleeve Pull over shirt/dress - Perfomed by helper: Thread/unthread right sleeve        Upper body assist Assist Level: More than reasonable time      Lower Body Dressing/Undressing Lower body dressing Lower body dressing/undressing activity did not occur: Refused What is the patient wearing?: Underwear, Pants, Socks, Shoes, AFO Underwear - Performed by patient: Thread/unthread left underwear leg, Pull underwear up/down, Thread/unthread right underwear leg Underwear - Performed by helper: Thread/unthread right underwear leg Pants- Performed  by patient: Thread/unthread left pants leg, Pull pants up/down, Thread/unthread right pants leg Pants- Performed by helper: Thread/unthread right  pants leg Non-skid slipper socks- Performed by patient: Don/doff left sock Non-skid slipper socks- Performed by helper: Don/doff right sock Socks - Performed by patient: Don/doff right sock, Don/doff left sock Socks - Performed by helper: Don/doff right sock Shoes - Performed by patient: Don/doff right shoe, Don/doff left shoe, Fasten right, Fasten left Shoes - Performed by helper: Don/doff right shoe, Fasten right AFO - Performed by patient: Don/doff right AFO AFO - Performed by helper: Don/doff right AFO      Lower body assist Assist for lower body dressing: Supervision or verbal cues      Toileting Toileting   Toileting steps completed by patient: Adjust clothing prior to toileting, Performs perineal hygiene, Adjust clothing after toileting Toileting steps completed by helper: Adjust clothing prior to toileting, Performs perineal hygiene, Adjust clothing after toileting Toileting Assistive Devices: Grab bar or rail  Toileting assist Assist level: No help/no cues   Transfers Chair/bed transfer   Chair/bed transfer method: Ambulatory Chair/bed transfer assist level: Supervision or verbal cues Chair/bed transfer assistive device: Cane, Orthosis, Armrests     Locomotion Ambulation     Max distance: 180 ft Assist level: Supervision or verbal cues   Wheelchair   Type: Manual Max wheelchair distance: 150 Assist Level: Supervision or verbal cues  Cognition Comprehension Comprehension assist level: Follows complex conversation/direction with no assist  Expression Expression assist level: Expresses basic needs/ideas: With no assist  Social Interaction Social Interaction assist level: Interacts appropriately with others with medication or extra time (anti-anxiety, antidepressant).  Problem Solving Problem solving assist level: Solves basic problems with no assist  Memory Memory assist level: Recognizes or recalls 90% of the time/requires cueing < 10% of the time   Medical Problem  List and Plan: 1. 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 DVT Prophylaxis/Anticoagulation: Mechanical: Sequential compression devices, below knee Bilateral lower extremities until discharge   -dopplers -negative 3. Pain Management:  continue oxycodone prn. encouraged use as needed.   -robaxin prn for spasms -  4. Mood: Monitor as mentation improves. LCSW to follow with patient for evaluation and support.  5. Neuropsych: This patient is not capable of making decisions on his own behalf. 6. Skin/Wound Care: Routine pressure relief measures. Monitor L-eye for resolution of edema.  7. Fluids/Electrolytes/Nutrition: Monitor I/O. continue nutritional supplements   -Sodium now WNL at 138 on 10/26. 8. Constipation:  Senna scheduled.    9. Urinary retention:Urecholine stopped--without issue  -dc flomax over next few days     10. Left retrobulbar hemorrhage with RAPD: eye clinically improved.. Pain appears to be under control.   -demonstrates improvement in medial movement and substantially with lateral movement---still with significant ptosis  -reviewed plan/recovery at this point with father. Prognosis is guarded at best  11. Spasticity:   tone in RLE improved with AFO and increase mobility, stretching with therapies and on his own-  -continue baclofen for extensor tone in RLE.   LOS (Days) 17 A FACE TO FACE EVALUATION WAS PERFORMED  SWARTZ,ZACHARY T 07/10/2015 8:51 AM

## 2015-07-10 NOTE — Progress Notes (Signed)
Physical Therapy Session Note  Patient Details  Name: Dakota [name] MRN: [id number] Date of Birth: 23-Oct-1985  Today's Date: 07/10/2015 PT Individual Time: 1300-1330 PT Individual Time Calculation (min): 30 min   Short Term Goals: Week 1:  PT Short Term Goal 1 (Week 1): Patient will ambulate x 100 ft using LRAD with assist of one person.  PT Short Term Goal 1 - Progress (Week 1): Met PT Short Term Goal 2 (Week 1): Patient will perform bed <> wheelchair transfers with consistent min A.  PT Short Term Goal 2 - Progress (Week 1): Met PT Short Term Goal 3 (Week 1): Patient will perform bed mobility with consistent min A and min cues for sequencing and technique.  PT Short Term Goal 3 - Progress (Week 1): Met PT Short Term Goal 4 (Week 1): Patient will negotiate up/down 12 stairs using 1 rail with mod A x 1.  PT Short Term Goal 4 - Progress (Week 1): Met PT Short Term Goal 5 (Week 1): Patient will maintain dynamic standing balance x 5 min with mod A overall.  PT Short Term Goal 5 - Progress (Week 1): Met  Skilled Therapeutic Interventions/Progress Updates:  Pt was seen bedside in the pm. Pt transferred sit to stand with cane, orthosis and S. Pt ambulated with cane, orthosis and S about 150 feet x 2. In gym treatment focused on NMR, utilizing step taps and alternating step taps, 3 sets x 10 reps each. Pt also sidestepped 50 feet x 2 with st cane and min A. Pt ambulated back to room with cane, orthosis and S.   Therapy Documentation Precautions:  Precautions Precautions: Fall Precaution Comments: R sided weakness Restrictions Weight Bearing Restrictions: No General:   Pain: No c/o pain.   See Function Navigator for Current Functional Status.   Therapy/Group: Individual Therapy  Dub Amis 07/10/2015, 2:41 PM

## 2015-07-11 ENCOUNTER — Inpatient Hospital Stay (HOSPITAL_COMMUNITY): Payer: 59

## 2015-07-11 ENCOUNTER — Inpatient Hospital Stay (HOSPITAL_COMMUNITY): Payer: 59 | Admitting: Occupational Therapy

## 2015-07-11 NOTE — Progress Notes (Signed)
Occupational Therapy Session Note  Patient Details  Name: Dakota [name] MRN: [id number] Date of Birth: 02/24/1986  Today's Date: 07/11/2015 OT Individual Time: 1100-1200 OT Individual Time Calculation (min): 60 min    Short Term Goals: Week 1:  OT Short Term Goal 1 (Week 1): Pt will transfer to the toilet with min A using a squat pivot transfer. OT Short Term Goal 1 - Progress (Week 1): Met OT Short Term Goal 2 (Week 1): Pt will complete toileting with steadying A. OT Short Term Goal 2 - Progress (Week 1): Met OT Short Term Goal 3 (Week 1): Pt will don shirt with set up. OT Short Term Goal 3 - Progress (Week 1): Met OT Short Term Goal 4 (Week 1): Pt will dress LB with min A. OT Short Term Goal 4 - Progress (Week 1): Met OT Short Term Goal 5 (Week 1): Pt will complete RUE self ROM ex with min cues. OT Short Term Goal 5 - Progress (Week 1): Met Week 2:  OT Short Term Goal 1 (Week 2): Pt will perform tub/shower transfer with supervision OT Short Term Goal 1 - Progress (Week 2): Met OT Short Term Goal 2 (Week 2): Pt will perform LB dressing tasks with supervision OT Short Term Goal 2 - Progress (Week 2): Met OT Short Term Goal 3 (Week 2): Pt wil perform UB dressing tasks with mod I OT Short Term Goal 3 - Progress (Week 2): Met OT Short Term Goal 4 (Week 2): Pt will perform toileting tasks with mod I OT Short Term Goal 4 - Progress (Week 2): Met Week 3:  OT Short Term Goal 1 (Week 3): STG=LTG secondary to ELOS  Skilled Therapeutic Interventions/Progress Updates:    Pt seen this session for RUE NMR. Pt ambulated to gym with SPC with S. Sat on mat with arm elevated on tray table with active grasp on tennis ball with tapping to finger extensor with total A to extend fingers off ball for 20 reps. Active elbow and shoulder range of motion with guiding A with table slides. Pt having difficulty with adequate elbow extension. Pt moved into supine on mat. His wife attended therapy session at this  time.  Pt worked on placing and holding arm into extension at 90 degrees of shoulder flex.  Pt able to hold arm extended for at least 3 seconds, 10x. Active elbow extension with shoulder held at 90 flex 15x.  BUE AROM with a light dowel bar (then used cane for room ex purposes) for overhead presses focusing on downward push to activate lats. Wife educated on how to guard cane/ dowel and support arm/hand on bar to keep pt safe during exercises.  Pt then sat to edge of mat for wt bearing on hand to elbow to hand 10 x focusing on tricep activation. Ambulated back to room with S with cane. In room, needed a cue to lock breaks prior to sitting down.  Pt in room with spouse.  Therapy Documentation Precautions:  Precautions Precautions: Fall Precaution Comments: R sided weakness Restrictions Weight Bearing Restrictions: No  Pain: Pain Assessment Pain Assessment: No/denies pain   ADL:   See Function Navigator for Current Functional Status.   Therapy/Group: Individual Therapy  SAGUIER,JULIA 07/11/2015, 12:39 PM  

## 2015-07-11 NOTE — Progress Notes (Signed)
Occupational Therapy Session Note  Patient Details  Name: Dakota PartridgeJessie Crunkleton MRN: [id number] Date of Birth: Jan 21, 1986  Today's Date: 07/11/2015 OT Individual Time: 0700-0800 OT Individual Time Calculation (min): 60 min    Short Term Goals: Week 3:  OT Short Term Goal 1 (Week 3): STG=LTG secondary to ELOS  Skilled Therapeutic Interventions/Progress Updates:    Pt asleep in bed upon arrival but aroused with min multimodal cues.  Pt engaged in BADL retraining with focus on functional amb with SPC, increased RUE use for functional tasks, standing balance, and safety awareness.  Pt amb with SPC to shower and completed bathing with sit<>stand from shower seat.  Pt completed dressing tasks with sit<>stand from EOB.  Pt amb with SPC in room to pick up towel from floor and carry with RUE to dirty linen bag.  Pt also retrieved deodorant from bed and carried to drawer with Rt hand.  Pt completed grooming tasks standing at sink.  Pt remained in w/c , eating breakfast, with wife present at end of session.  Therapy Documentation Precautions:  Precautions Precautions: Fall Precaution Comments: R sided weakness Restrictions Weight Bearing Restrictions: No General:   Pain: Pain Assessment Pain Assessment: No/denies pain See Function Navigator for Current Functional Status.   Therapy/Group: Individual Therapy  [name], Karmelo Bass Chappell 07/11/2015, 8:11 AM

## 2015-07-11 NOTE — Progress Notes (Signed)
Occupational Therapy Note  Patient Details  Name: Dakota PartridgeJessie [name] MRN: [id number] Date of Birth: 1986/04/15  Today's Date: 07/11/2015 OT Individual Time: 1300-1400 OT Individual Time Calculation (min): 60 min   Pt denied pain Individual therapy  Pt resting in w/c upon arrival with wife present.  Pt propelled to therapy gym using BLE and transferred to mat.  Pt initially engaged in RUE weight bearing on elbow laying on stomach.  Pt also engaged in weight bearing through RUE while sitting EOM and with shoulder flexed and elbow extended at approx 90 degrees.  Pt transitioned to series of therapeutic activities with focus on continued increased RUE movement patterns at shoulder, elbow, and wrist/hand.  Pt continues to require max verbal cues for correct form.  Pt continues to exhibit increased active movement at scapula/shoulder, elbow, and wrist/hand.   Lavone NeriLanier, Ernest Popowski [facility] Hospital[facility]appell 07/11/2015, 2:59 PM

## 2015-07-11 NOTE — Progress Notes (Signed)
Physical Therapy Session Note  Patient Details  Name: Dakota PartridgeJessie [name] MRN: [id number] Date of Birth: 1985/10/26  Today's Date: 07/11/2015 PT Individual Time: 0900-1000 PT Individual Time Calculation (min): 60 min   Short Term Goals: Week 3:  PT Short Term Goal 1 (Week 3): = LTGs of overall mod I except S community ambulation and stairs  Skilled Therapeutic Interventions/Progress Updates:    Patient seen with wife present part of session.  Focus on UE/trunk neuro-re-ed, gait and balance.  Able to transfer with supervision throughout session and ambulated 175' with SPC and right AFO with heel wedge supervision occasional cues for step length, knee control.  Negotiated 4 steps with one rail supervision with cues for technique and step ups to first step with right first x 5 reps for LE strengthening.  Kneeling on mat with elbows on bench, then lower step, then on therapy ball for reaching with left for right UE and trunk activation in weight bearing position.  Same position for hip extension and ant/post rocking for increased UE weight bearing.  Gait without brace x 150' with cane and min facilitation for hip and trunk activation in stance cues for "soft knee" and heel strike.  Patient performed activities for increased right LE weight bearing including taps to cones, to step and stepping on curb step as well as over "Balance Zone" beam.  Patient performed putting with golf balls utilizing grip incorporating both hands as well as squatting to place and retrieve balls with min assist.  Patient on [employer] balance machine for catch game with bilat UE support min progressing to no cues for about 3 minutes.  Gait back to room 150' supervision with cane and AFO and left with wife in room.  Therapy Documentation Precautions:  Precautions Precautions: Fall Precaution Comments: R sided weakness Restrictions Weight Bearing Restrictions: No Pain: Pain Assessment Pain Assessment: No/denies pain   See  Function Navigator for Current Functional Status.   Therapy/Group: Individual Therapy  Rudi CocoWYNN,CYNDI  Cyndi Mars HillWynn, South CarolinaPT [phone] 07/11/2015  07/11/2015, 10:28 AM

## 2015-07-11 NOTE — Progress Notes (Signed)
[facility] PHYSICAL MEDICINE & REHABILITATION     PROGRESS NOTE    Subjective/Complaints: Had a good weekend. Emptying bladder. No pain. No new questions today  ROS: Pt denies CP, SOB, n/v/d.   Objective: Vital Signs: Blood pressure 104/56, pulse 81, temperature 98.5 F (36.9 C), temperature source Oral, resp. rate 18, weight 85.775 kg (189 lb 1.6 oz), SpO2 98 %. No results found. No results for input(s): WBC, HGB, HCT, PLT in the last 72 hours. No results for input(s): NA, K, CL, GLUCOSE, BUN, CREATININE, CALCIUM in the last 72 hours.  Invalid input(s): CO CBG (last 3)  No results for input(s): GLUCAP in the last 72 hours.  Wt Readings from Last 3 Encounters:  07/06/15 85.775 kg (189 lb 1.6 oz)  06/20/15 91.7 kg (202 lb 2.6 oz)   upper extremity.  Physical Exam:  Constitutional: He appears well-developed and well-nourished. Flat affect. Alert.    HENT:  Head: Normocephalic. Left eye and face trauma Eyes: Right eye exhibits no discharge.  Left ptosis. Right - normal in appearance. Bruising essentially resolved  Neck: Normal range of motion. Neck supple.  Cardiovascular: Normal rate and regular rhythm.  No murmur heard. Respiratory: Effort normal and breath sounds normal. No respiratory distress. He has no wheezes.  GI: Soft. Bowel sounds are normal. He exhibits no distension. There is no tenderness. No rebound Musculoskeletal: He exhibits tenderness.  Minimal edema in LUE.  Neurological: He is alert and oriented.  He is unable to lift his left eye lid. Left pupil dilated. Improved lateral>medial movement of eye.  Right facial weakness with mild to moderate dysarthria and right tongue deviation. Dense right hemiparesis of the UE: 2-/5 shoulder,  1+ biceps, triceps, 0/5 distally. RLE: grossly 3/5/5 HF,4-/5 KE, 2+/5 ADF/APF. Extensor tone seems minimal today RLE   Tight heel cord still 5/5 on the left upper and left lower extremity Sensation intact to light touch.    Psychiatric: His affect is blunt. His speech is delayed and slurred. He is slowed.   Assessment/Plan: 1. Functional deficits secondary to TBI with [facility] which require 3+ hours per day of interdisciplinary therapy in a comprehensive inpatient rehab setting. Physiatrist is providing close team supervision and 24 hour management of active medical problems listed below. Physiatrist and rehab team continue to assess barriers to discharge/monitor patient progress toward functional and medical goals.  Function:  Bathing Bathing position Bathing activity did not occur: Refused Position: Shower  Bathing parts Body parts bathed by patient: Right arm, Chest, Abdomen, Front perineal area, Right upper leg, Left upper leg, Right lower leg, Left lower leg, Buttocks, Left arm Body parts bathed by helper: Back  Bathing assist Assist Level: Supervision or verbal cues   Set up : To obtain items  Upper Body Dressing/Undressing Upper body dressing Upper body dressing/undressing activity did not occur: Refused What is the patient wearing?: Pull over shirt/dress     Pull over shirt/dress - Perfomed by patient: Thread/unthread left sleeve, Put head through opening, Pull shirt over trunk, Thread/unthread right sleeve Pull over shirt/dress - Perfomed by helper: Thread/unthread right sleeve        Upper body assist Assist Level: Set up   Set up : To obtain clothing/put away  Lower Body Dressing/Undressing Lower body dressing Lower body dressing/undressing activity did not occur: Refused What is the patient wearing?: Underwear, Pants, Socks, Shoes, AFO Underwear - Performed by patient: Thread/unthread left underwear leg, Pull underwear up/down, Thread/unthread right underwear leg Underwear - Performed by helper: Thread/unthread  right underwear leg Pants- Performed by patient: Thread/unthread left pants leg, Pull pants up/down, Thread/unthread right pants leg Pants- Performed by helper: Thread/unthread right  pants leg Non-skid slipper socks- Performed by patient: Don/doff left sock Non-skid slipper socks- Performed by helper: Don/doff right sock Socks - Performed by patient: Don/doff right sock, Don/doff left sock Socks - Performed by helper: Don/doff right sock Shoes - Performed by patient: Don/doff right shoe, Don/doff left shoe, Fasten right, Fasten left Shoes - Performed by helper: Don/doff right shoe, Fasten right AFO - Performed by patient: Don/doff right AFO AFO - Performed by helper: Don/doff right AFO      Lower body assist Assist for lower body dressing: Supervision or verbal cues      Toileting Toileting   Toileting steps completed by patient: Adjust clothing prior to toileting, Performs perineal hygiene, Adjust clothing after toileting Toileting steps completed by helper: Adjust clothing prior to toileting, Performs perineal hygiene, Adjust clothing after toileting Toileting Assistive Devices: Grab bar or rail  Toileting assist Assist level: No help/no cues   Transfers Chair/bed transfer   Chair/bed transfer method: Ambulatory Chair/bed transfer assist level: Supervision or verbal cues Chair/bed transfer assistive device: Armrests, Cane, Orthosis     Locomotion Ambulation     Max distance: 175 Assist level: Supervision or verbal cues   Wheelchair   Type: Manual Max wheelchair distance: 150 Assist Level: Supervision or verbal cues  Cognition Comprehension Comprehension assist level: Follows basic conversation/direction with extra time/assistive device  Expression Expression assist level: Expresses basic needs/ideas: With no assist  Social Interaction Social Interaction assist level: Interacts appropriately with others with medication or extra time (anti-anxiety, antidepressant).  Problem Solving Problem solving assist level: Solves basic problems with no assist  Memory Memory assist level: Recognizes or recalls 90% of the time/requires cueing < 10% of the time    Medical Problem List and Plan: 1. 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 DVT Prophylaxis/Anticoagulation: Mechanical: Sequential compression devices, below knee Bilateral lower extremities until discharge   -dopplers -negative 3. Pain Management:  continue oxycodone prn. encouraged use as needed.   -robaxin prn for spasms -  4. Mood: Monitor as mentation improves. LCSW to follow with patient for evaluation and support.  5. Neuropsych: This patient is not capable of making decisions on his own behalf. 6. Skin/Wound Care: Routine pressure relief measures. Monitor L-eye for resolution of edema.  7. Fluids/Electrolytes/Nutrition: Monitor I/O. continue nutritional supplements   -Sodium now WNL at 138 on 10/26. 8. Constipation:  Senna scheduled.    9. Urinary retention:Urecholine stopped--without issue  -dc flomax today    10. Left retrobulbar hemorrhage with RAPD: eye clinically improved.. Pain appears to be under control.   -demonstrates improvement in medial movement and substantially with lateral movement---still with significant ptosis  -reviewed plan/recovery at this point with father. Prognosis is guarded at best  11. Spasticity:   tone in RLE improved with AFO and increase mobility, stretching with therapies and on his own-  -continue baclofen for extensor tone in RLE---appears to have helped  LOS (Days) 18 A FACE TO FACE EVALUATION WAS PERFORMED  Tylen Leverich T 07/11/2015 8:45 AM

## 2015-07-11 NOTE — Progress Notes (Signed)
Physical Therapy Session Note  Patient Details  Name: Dakota [name] MRN: [id number] Date of Birth: Dec 21, 1985  Today's Date: 07/11/2015 PT Individual Time: 1400-1430 PT Individual Time Calculation (min): 30 min   Short Term Goals: Week 3:  PT Short Term Goal 1 (Week 3): = LTGs of overall mod I except S community ambulation and stairs  Skilled Therapeutic Interventions/Progress Updates:    Patient agreeable to walk outside.  Ambulated about 300' level indoor supervision occasional cue for right knee control.  Seated rest prior to going outside.  Ambulated down inclined pavement with cane 40' and minguard assist.  Then over grass about 3250' with SPC min A cue for right knee control and foot clearance.  Seated rest, then ambulated up grassy incline with cane 40' min A and cue for right knee control, clearance over uneven surface and caution for ankle control.  Ambulated incline pavement 30' min A and up 6 steps with rail S, then seated rest.  Back indoors another 300' supervision with SPC.  Left in bed all needs in reach.  Therapy Documentation Precautions:  Precautions Precautions: Fall Precaution Comments: R sided weakness Restrictions Weight Bearing Restrictions: No n: Pain Assessment Pain Score: 4  Pain Type: Acute pain Pain Location: Head Pain Orientation: Left Pain Radiating Towards: head Pain Descriptors / Indicators: Aching Pain Intervention(s): Ambulation/increased activity   See Function Navigator for Current Functional Status.   Therapy/Group: Individual Therapy  Rudi CocoWYNN,CYNDI  [name], South CarolinaPT [phone] 07/11/2015  07/11/2015, 3:54 PM

## 2015-07-12 ENCOUNTER — Inpatient Hospital Stay (HOSPITAL_COMMUNITY): Payer: 59 | Admitting: Occupational Therapy

## 2015-07-12 ENCOUNTER — Inpatient Hospital Stay (HOSPITAL_COMMUNITY): Payer: 59 | Admitting: Physical Therapy

## 2015-07-12 ENCOUNTER — Inpatient Hospital Stay (HOSPITAL_COMMUNITY): Payer: 59

## 2015-07-12 NOTE — Progress Notes (Signed)
Occupational Therapy Session Note  Patient Details  Name: Dakota [name] MRN: [id number] Date of Birth: 04/03/1986  Today's Date: 07/12/2015 OT Individual Time: 0700-0800 OT Individual Time Calculation (min): 60 min    Short Term Goals: Week 3:  OT Short Term Goal 1 (Week 3): STG=LTG secondary to ELOS  Skilled Therapeutic Interventions/Progress Updates:    Pt asleep upon arrival but easily aroused and ready for bathing/dressing tasks.  Pt amb with SPC in room to gather clothing before entering bathroom.  Pt required steady A for amb without AFO.  Pt completed bathing at shower level and dressing with sit<>stand from EOB.  Focus on increased functional use for bathing and dressing tasks.  Pt requires min verbal cues to initiate use of RUE in functional tasks.  Pt continues to require hand over hand assistance to incorporate use of RUE in functional tasks.   Therapy Documentation Precautions:  Precautions Precautions: Fall Precaution Comments: R sided weakness Restrictions Weight Bearing Restrictions: No Pain: Pain Assessment Pain Assessment: 0-10 Pain Score: 4  Pain Intervention(s):rest, emotional support See Function Navigator for Current Functional Status.   Therapy/Group: Individual Therapy  [name], Yissel Habermehl Chappell 07/12/2015, 8:03 AM

## 2015-07-12 NOTE — Progress Notes (Signed)
Occupational Therapy Note  Patient Details  Name: Pieter PartridgeJessie Cullinane MRN: [id number] Date of Birth: 06-22-1986  Today's Date: 07/12/2015 OT Individual Time: 1107-1205 OT Individual Time Calculation (min): 58 min   Pt denied pain Individual Therapy  Pt propelled to therapy gym and engaged in RUE AROM activities/tasks with focus on isolated movements and decreased compensatory movement.  Pt performed tasks in supine and sitting EOM.  Pt noted with increased movement with shoulder flexion/extension, elbow flexion/extension, RUE supination/pronation, wrist flexion/extension, and finger flexion.     Lavone NeriLanier, Charleene Callegari [facility] HospitalChappell 07/12/2015, 12:08 PM

## 2015-07-12 NOTE — Progress Notes (Signed)
Physical Therapy Session Note  Patient Details  Name: Dakota [name] MRN: [id number] Date of Birth: Nov 01, 1985  Today's Date: 07/12/2015 PT Individual Time: 1007-1107 and 1411-1432 PT Individual Time Calculation (min): 60 min and 21 min    Short Term Goals: Week 2:  PT Short Term Goal 1 (Week 2): = LTGs of overall supervision PT Short Term Goal 1 - Progress (Week 2): Met Week 3:  PT Short Term Goal 1 (Week 3): = LTGs of overall mod I except S community ambulation and stairs  Skilled Therapeutic Interventions/Progress Updates:   Pt received in w/c; wife not present for am session.  Pt performed gait with SPC x 150' in controlled environment with supervision and improved knee control on RLE with no evidence of recurvatum; pt reports his biggest impairment now is his endurance-reports that as he fatigues his gait worsens.  Performed NMR; see below for details.  Orthotist present from [place] to deliver and fit pt with R posterior leaf spring AFO and heel wedge.  Pt educated on fit, care, maintenance of AFO, appropriate shoes to wear AFO in and how to inspect skin.  Will educate wife this pm on AFO care.  Performed gait assessment and stair negotiation training with new AFO.  Pt performed 12 stair negotiation x 2 reps with LUE support on rail with therapist problem solving with pt how to manage rail + SPC (Pt reports he will have his wife carry it for him) with supervision overall and verbal cues for safe sequencing with pt initiating with step to sequence and progressing to safe alternating sequence.  Returned to room with [facility] and new AFO with supervision with verbal cues for increased RLE knee flexion at terminal stance to initiate advancement.  Pt left in w/c with all items within reach to await OT.     PM session:  Pt received coming out of bathroom ambulating with wife.  Educated wife on specifics of AFO, skin inspections, donning/wearing schedule and cleaning.  Wife verbalized understanding.  Wife  stating that pt needs to practice bed mobility on tall bed.  Pt ambulated to gym with supervision.  Elevated mat to height of bed at home and performed scooting onto bed and sit <> supine on R side Mod I.  Discussed with pt typical showering schedule and dressing routine.  Pt reports showering at night and will likely not don shoes before ambulating to/from shower.  Practiced ambulating across floor x 25' x 2 barefooted with SPC and min A with one episode of LOB with min A to recover.  Returned to room and pt left in w/c with wife present with all items within reach.    Therapy Documentation Precautions:  Precautions Precautions: Fall Precaution Comments: R sided weakness Restrictions Weight Bearing Restrictions: No Pain: Pain Assessment Pain Assessment: No/denies pain Other Treatments: Treatments Neuromuscular Facilitation: Right;Left;Upper Extremity;Lower Extremity;Forced use;Activity to increase coordination;Activity to increase motor control;Activity to increase timing and sequencing;Activity to increase sustained activation;Activity to increase lateral weight shifting;Activity to increase anterior-posterior weight shifting beginning on Nustep with bilat UE and LE (RUE support with hand splint) at level 6 x 8 minutes for coordination, activation, sequencing and endurance.  Continued NMR and postural control training seated on therapy ball with focus on trunk control, righting reactions and R sided activation during dynamic UE and LE movements.  Supine on mat performed NMR for bilat LE and core stabilization training with bilat feet on therapy ball performing hamstring curls, bridging and RLE single leg hamstring curl.  See Function Navigator for Current Functional Status.   Therapy/Group: Individual Therapy  Raylene Everts Faucette 07/12/2015, 11:14 AM

## 2015-07-12 NOTE — Progress Notes (Signed)
[name] PHYSICAL MEDICINE & REHABILITATION     PROGRESS NOTE    Subjective/Complaints: Slept well. Just got showered, getting dressed  ROS: Pt denies CP, SOB, n/v/d.   Objective: Vital Signs: Blood pressure 109/58, pulse 72, temperature 98.4 F (36.9 C), temperature source Oral, resp. rate 18, weight 85.775 kg (189 lb 1.6 oz), SpO2 98 %. No results found. No results for input(s): WBC, HGB, HCT, PLT in the last 72 hours. No results for input(s): NA, K, CL, GLUCOSE, BUN, CREATININE, CALCIUM in the last 72 hours.  Invalid input(s): CO CBG (last 3)  No results for input(s): GLUCAP in the last 72 hours.  Wt Readings from Last 3 Encounters:  07/06/15 85.775 kg (189 lb 1.6 oz)  06/20/15 91.7 kg (202 lb 2.6 oz)   upper extremity.  Physical Exam:  Constitutional: He appears well-developed and well-nourished. Flat affect. Alert.    HENT:  Head: Normocephalic. Left eye and face trauma Eyes: Right eye exhibits no discharge.  Left ptosis. Right - normal in appearance. Bruising essentially resolved  Neck: Normal range of motion. Neck supple.  Cardiovascular: Normal rate and regular rhythm.  No murmur heard. Respiratory: Effort normal and breath sounds normal. No respiratory distress. He has no wheezes.  GI: Soft. Bowel sounds are normal. He exhibits no distension. There is no tenderness. No rebound Musculoskeletal: He exhibits tenderness.  Minimal edema in LUE.  Neurological: He is alert and oriented.  He is unable to lift his left eye lid. Left pupil dilated. Improved lateral>medial movement of eye.  Right facial weakness with mild to moderate dysarthria and right tongue deviation. Dense right hemiparesis of the UE: 2-/5 shoulder,  1+ biceps, triceps, 0/5 distally. RLE: grossly 3/5/5 HF,4-/5 KE, 2+/5 ADF/APF. Extensor tone seems minimal today RLE   Tight heel cord still 5/5 on the left upper and left lower extremity Sensation intact to light touch.   Psychiatric: His  affect is blunt. His speech is delayed and slurred. He is slowed.   Assessment/Plan: 1. Functional deficits secondary to TBI with [facility] which require 3+ hours per day of interdisciplinary therapy in a comprehensive inpatient rehab setting. Physiatrist is providing close team supervision and 24 hour management of active medical problems listed below. Physiatrist and rehab team continue to assess barriers to discharge/monitor patient progress toward functional and medical goals.  Function:  Bathing Bathing position Bathing activity did not occur: Refused Position: Shower  Bathing parts Body parts bathed by patient: Right arm, Chest, Abdomen, Front perineal area, Right upper leg, Left upper leg, Right lower leg, Left lower leg, Buttocks, Left arm Body parts bathed by helper: Back  Bathing assist Assist Level: Supervision or verbal cues   Set up : To obtain items  Upper Body Dressing/Undressing Upper body dressing Upper body dressing/undressing activity did not occur: Refused What is the patient wearing?: Pull over shirt/dress     Pull over shirt/dress - Perfomed by patient: Thread/unthread left sleeve, Put head through opening, Pull shirt over trunk, Thread/unthread right sleeve Pull over shirt/dress - Perfomed by helper: Thread/unthread right sleeve        Upper body assist Assist Level: More than reasonable time   Set up : To obtain clothing/put away  Lower Body Dressing/Undressing Lower body dressing Lower body dressing/undressing activity did not occur: Refused What is the patient wearing?: Underwear, Pants, Socks, Shoes, AFO Underwear - Performed by patient: Thread/unthread left underwear leg, Pull underwear up/down, Thread/unthread right underwear leg Underwear - Performed by helper: Thread/unthread right underwear leg  Pants- Performed by patient: Thread/unthread left pants leg, Pull pants up/down, Thread/unthread right pants leg Pants- Performed by helper: Thread/unthread right  pants leg Non-skid slipper socks- Performed by patient: Don/doff left sock Non-skid slipper socks- Performed by helper: Don/doff right sock Socks - Performed by patient: Don/doff right sock, Don/doff left sock Socks - Performed by helper: Don/doff right sock Shoes - Performed by patient: Don/doff right shoe, Don/doff left shoe, Fasten right, Fasten left Shoes - Performed by helper: Don/doff right shoe, Fasten right AFO - Performed by patient: Don/doff right AFO AFO - Performed by helper: Don/doff right AFO      Lower body assist Assist for lower body dressing: Supervision or verbal cues      Toileting Toileting   Toileting steps completed by patient: Adjust clothing prior to toileting, Performs perineal hygiene, Adjust clothing after toileting Toileting steps completed by helper: Adjust clothing prior to toileting, Performs perineal hygiene, Adjust clothing after toileting Toileting Assistive Devices: Grab bar or rail  Toileting assist Assist level: Supervision or verbal cues   Transfers Chair/bed transfer   Chair/bed transfer method: Ambulatory Chair/bed transfer assist level: Supervision or verbal cues Chair/bed transfer assistive device: Armrests, Cane, Orthosis     Locomotion Ambulation     Max distance: 300 Assist level: Supervision or verbal cues   Wheelchair   Type: Manual Max wheelchair distance: 150 Assist Level: Supervision or verbal cues  Cognition Comprehension Comprehension assist level: Follows basic conversation/direction with extra time/assistive device  Expression Expression assist level: Expresses basic needs/ideas: With no assist  Social Interaction Social Interaction assist level: Interacts appropriately with others with medication or extra time (anti-anxiety, antidepressant).  Problem Solving Problem solving assist level: Solves basic problems with no assist  Memory Memory assist level: Recognizes or recalls 90% of the time/requires cueing < 10% of the  time   Medical Problem List and Plan: 1. 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 DVT Prophylaxis/Anticoagulation: Mechanical: Sequential compression devices, below knee Bilateral lower extremities until discharge   -dopplers -negative 3. Pain Management:  continue oxycodone prn. encouraged use as needed.   -robaxin prn for spasms -  4. Mood: Monitor as mentation improves. LCSW to follow with patient for evaluation and support.  5. Neuropsych: This patient is not capable of making decisions on his own behalf. 6. Skin/Wound Care: Routine pressure relief measures. Monitor L-eye for resolution of edema.  7. Fluids/Electrolytes/Nutrition: Monitor I/O. continue nutritional supplements   -Sodium now WNL at 138 on 10/26. 8. Constipation:  Senna scheduled.    9. Urinary retention:Urecholine stopped--without issue  -off flomax also    10. Left retrobulbar hemorrhage with RAPD: eye clinically improved.. Pain appears to be under control.   -demonstrates improvement in medial movement and substantially with lateral movement---still with significant ptosis  -reviewed plan/recovery at this point with father. Prognosis is guarded at best  11. Spasticity:   tone in RLE improved with AFO and increase mobility, stretching with therapies and on his own-  -continue baclofen for extensor tone in RLE---appears to have helped  LOS (Days) 19 A FACE TO FACE EVALUATION WAS PERFORMED  Dakota [name] T 07/12/2015 8:40 AM

## 2015-07-12 NOTE — Progress Notes (Signed)
Occupational Therapy Session Note  Patient Details  Name: Dakota [name] MRN: [id number] Date of Birth: July 24, 1986  Today's Date: 07/12/2015 OT Individual Time: 1300-1400 OT Individual Time Calculation (min): 60 min    Short Term Goals: Week 2:  OT Short Term Goal 1 (Week 2): Pt will perform tub/shower transfer with supervision OT Short Term Goal 1 - Progress (Week 2): Met OT Short Term Goal 2 (Week 2): Pt will perform LB dressing tasks with supervision OT Short Term Goal 2 - Progress (Week 2): Met OT Short Term Goal 3 (Week 2): Pt wil perform UB dressing tasks with mod I OT Short Term Goal 3 - Progress (Week 2): Met OT Short Term Goal 4 (Week 2): Pt will perform toileting tasks with mod I OT Short Term Goal 4 - Progress (Week 2): Met Week 3:  OT Short Term Goal 1 (Week 3): STG=LTG secondary to ELOS  Skilled Therapeutic Interventions/Progress Updates:    NMR: Focus on Neuro muscular reeducation on right UE in supine, sitting and standing. Performed PNF patterns in supine and sitting with tactile cues for normal patterns of movement and attention to isolating certain muscle groups. Focus on functional reach with grasp and release. Hold items while walking focusing normal pattern in ambulation - holding items in right hand for mm activity. Pt with more distal movement include bicep and triceps and  grasp movement with minimal tactile cues for positioning. Pt still with difficulty requiring max A to extend wrist and hand.   Therapy Documentation Precautions:  Precautions Precautions: Fall Precaution Comments: R sided weakness Restrictions Weight Bearing Restrictions: No Pain: Pain Assessment Pain Assessment: No/denies pain Pain Score: 0-No pain  See Function Navigator for Current Functional Status.   Therapy/Group: Individual Therapy  Willeen Cass [facility] 07/12/2015, 3:31 PM

## 2015-07-12 NOTE — Plan of Care (Signed)
Problem: RH Light Housekeeping Goal: LTG Patient will perform light housekeeping w/assist (OT) LTG: Patient will perform light housekeeping with assistance, with/without cues (OT).  D/c goal- n/a at this time.

## 2015-07-13 ENCOUNTER — Inpatient Hospital Stay (HOSPITAL_COMMUNITY): Payer: 59

## 2015-07-13 ENCOUNTER — Inpatient Hospital Stay (HOSPITAL_COMMUNITY): Payer: 59 | Admitting: Physical Therapy

## 2015-07-13 LAB — URINALYSIS, ROUTINE W REFLEX MICROSCOPIC
BILIRUBIN URINE: NEGATIVE
Glucose, UA: NEGATIVE mg/dL
KETONES UR: NEGATIVE mg/dL
NITRITE: NEGATIVE
PH: 6.5 (ref 5.0–8.0)
Protein, ur: NEGATIVE mg/dL
SPECIFIC GRAVITY, URINE: 1.018 (ref 1.005–1.030)
UROBILINOGEN UA: 0.2 mg/dL (ref 0.0–1.0)

## 2015-07-13 LAB — URINE MICROSCOPIC-ADD ON

## 2015-07-13 MED ORDER — INFLUENZA VAC SPLIT QUAD 0.5 ML IM SUSY
0.5000 mL | PREFILLED_SYRINGE | INTRAMUSCULAR | Status: AC
  Administered 2015-07-13: 0.5 mL via INTRAMUSCULAR

## 2015-07-13 NOTE — Progress Notes (Signed)
Social Work Patient ID: Dakota PartridgeJessie Briel, male   DOB: 1986/05/11, 29 y.o.   MRN: [id number]   Have reviewed team conference with pt and his wife.  Both feeling ready for d/c tomorrow and aware we have arranged OP Rehab for follow up.  Denies any concerns at this time.  Continue to follow.  Brandell Maready, LCSW

## 2015-07-13 NOTE — Progress Notes (Signed)
Physical Therapy Discharge Summary  Patient Details  Name: Dakota [name] MRN: [id number] Date of Birth: 26-Feb-1986  Today's Date: 07/13/2015 PT Individual Time: 1105-1205 PT Individual Time Calculation (min): 60 min    Patient has met 12 of 12 long term goals due to improved activity tolerance, improved balance, improved postural control, decreased pain, improved attention and improved awareness.  Patient to discharge at an ambulatory level Modified Independent.   Patient's care partner is independent to provide the necessary assistance at discharge.   Recommendation:  Patient will benefit from ongoing skilled PT services in outpatient setting to continue to advance safe functional mobility, address ongoing impairments in coordination, balance, muscle co-activation, and motor control,  and minimize fall risk.  Equipment: SPC and AFO  Reasons for discharge: treatment goals met and discharge from hospital  Patient/family agrees with progress made and goals achieved: Yes   Skilled Therapeutic Intervention:  Pt agreeable to therapy session. Session focused on high level gait/balance training and neuromuscular re-ed.  Pt amb x150' with SPC mod I in controlled environment, x20 feet (4x across floor mat) with SPC on compliant surface with close supervision and occasional min assist during step up onto compliant surface for balance.  PT instructed patient in amb while pushing rolling table x60' with RUE to facilitate RUE muscle activation and dual task. Pt required overall steady assist with this task.  PT instructed patient in OTAGO C level exercises (eliminated stairs and sit<>stands and heel walking/toe walking due to AFO) for balance and gait with verbal cues for R hamstring activation during R swing phase. NMR for sitting balance and standing balance on compliant surfaces.  Pt performed ball taps standing on compliant surfaces with verbal cues for "soft knees" and reaching outside BOS.  PT  instructed patient in PNF patterns using zoom ball while pt seated on dynadisk with no LE support for increase challenge/trunk control, 2nd PT facilitating at RUE, scapula, and trunk.  Pt amb back to room at end of session mod I with SPC and mod I sign posted outside pt's room.  All needs met.  PM: At end of OT family ed session PT reviewed stair negotiation sequence with pt and wife and had pt and wife return demonstrate with pt use of LUE support on rail up/down 4 stairs x 2 with pt's wife providing close supervision and wife carrying SPC to allow pt to maintain UE support on rail.  Pt and wife with no further complaints or questions.       PT Discharge Precautions/Restrictions Precautions Precaution Comments: R sided weakness, UE>LE Restrictions Weight Bearing Restrictions: No Pain Pain Assessment Pain Assessment: No/denies pain Vision/Perception  Vision - Assessment Eye Alignment: Within Functional Limits Ocular Range of Motion: Within Functional Limits Alignment/Gaze Preference: Within Defined Limits Tracking/Visual Pursuits: Other (comment) (L eye with decreased smoothness with vertical and horizontal tracking) Saccades: Additional eye shifts occurred during testing Additional Comments: L eye injury, pt can manually open eyelid and is able to see with slightly blurred vision, pupil dilated   Cognition Overall Cognitive Status: Within Functional Limits for tasks assessed Arousal/Alertness: Awake/alert Orientation Level: Oriented X4 Attention: Divided Selective Attention: Appears intact Selective Attention Impairment: Verbal complex;Functional complex Alternating Attention: Appears intact Divided Attention: Appears intact Memory: Impaired Memory Impairment: Decreased recall of new information Decreased Short Term Memory: Verbal complex;Functional complex Awareness: Impaired Awareness Impairment: Emergent impairment Problem Solving: Appears intact Problem Solving Impairment:  Verbal complex;Other (comment) Self Monitoring: Appears intact Self Correcting: Appears intact Behaviors: Impulsive Safety/Judgment: Appears  intact [employer].es Scales of Cognitive Functioning: Purposeful/appropriate Sensation Sensation Light Touch: Appears Intact Stereognosis: Appears Intact Hot/Cold: Appears Intact Proprioception: Impaired by gross assessment Proprioception Impaired Details: Impaired RUE;Impaired RLE Coordination Gross Motor Movements are Fluid and Coordinated: No Fine Motor Movements are Fluid and Coordinated: No Coordination and Movement Description: R UE > LE hemiparesis Motor  Motor Motor: Hemiplegia Motor - Skilled Clinical Observations: R hemiparesis Motor - Discharge Observations: slight tone in R hand with activity  Mobility Bed Mobility Bed Mobility:  (modified independent for all) Transfers Transfers: Yes Stand Pivot Transfers: 6: Modified independent (Device/Increase time) Locomotion  Ambulation Ambulation: Yes Ambulation/Gait Assistance: 6: Modified independent (Device/Increase time) Ambulation Distance (Feet): 150 Feet Assistive device: Straight cane Ambulation/Gait Assistance Details: Verbal cues for technique Gait Gait: Yes Gait Pattern: Impaired Gait Pattern: Right genu recurvatum;Decreased dorsiflexion - right;Decreased hip/knee flexion - right;Trunk rotated posteriorly on right;Decreased trunk rotation High Level Ambulation High Level Ambulation: Side stepping;Backwards walking;Other high level ambulation Side Stepping: supervision Backwards Walking: supervision High Level Ambulation - Other Comments: tandem walking- supervision forwards, min assist backwards Stairs / Additional Locomotion Stairs: Yes Stairs Assistance: 5: Supervision Stair Management Technique: One rail Left Number of Stairs: 12 Height of Stairs: 6 ((4) 6" high; (8) 3" high) Wheelchair Mobility Wheelchair Mobility: No  Trunk/Postural Assessment  Cervical  Assessment Cervical Assessment: Within Functional Limits Thoracic Assessment Thoracic Assessment: Within Functional Limits Lumbar Assessment Lumbar Assessment: Within Functional Limits Postural Control Postural Control: Within Functional Limits  Balance Balance Balance Assessed: Yes Standardized Balance Assessment Standardized Balance Assessment: Berg Balance Test Berg Balance Test Sit to Stand: Able to stand without using hands and stabilize independently Standing Unsupported: Able to stand safely 2 minutes Sitting with Back Unsupported but Feet Supported on Floor or Stool: Able to sit safely and securely 2 minutes Stand to Sit: Sits safely with minimal use of hands Transfers: Able to transfer safely, minor use of hands Standing Unsupported with Eyes Closed: Able to stand 10 seconds safely Standing Ubsupported with Feet Together: Able to place feet together independently and stand 1 minute safely From Standing, Reach Forward with Outstretched Arm: Can reach confidently >25 cm (10") From Standing Position, Pick up Object from Floor: Able to pick up shoe safely and easily From Standing Position, Turn to Look Behind Over each Shoulder: Looks behind from both sides and weight shifts well Turn 360 Degrees: Able to turn 360 degrees safely but slowly Standing Unsupported, Alternately Place Feet on Step/Stool: Able to complete 4 steps without aid or supervision Standing Unsupported, One Foot in Front: Able to place foot tandem independently and hold 30 seconds Standing on One Leg: Able to lift leg independently and hold > 10 seconds Total Score: 52 Static Sitting Balance Static Sitting - Level of Assistance: 7: Independent Dynamic Sitting Balance Dynamic Sitting - Level of Assistance: 7: Independent Extremity Assessment   RLE Assessment RLE Assessment: Exceptions to [facility] RLE PROM (degrees) Overall PROM Right Lower Extremity: Deficits RLE Overall PROM Comments: tight heel cords, 0  degrees DF RLE Strength RLE Overall Strength Comments: grossly in sitting: hip flex 3-/5 but able to take significant resistance; knee flex/ext 4/5; ankle DF 3-/5 but able to take moderate resistance;  RLE Tone RLE Tone: Within Functional Limits LLE Assessment LLE Assessment: Within Functional Limits   See Function Navigator for Current Functional Status.  Caitlin E Penven-Crew 07/13/2015, 12:36 PM

## 2015-07-13 NOTE — Progress Notes (Signed)
Occupational Therapy Session Note  Patient Details  Name: Dakota PartridgeJessie [name] MRN: [id number] Date of Birth: 24-Dec-1985  Today's Date: 07/13/2015 OT Individual Time: 0700-0800 OT Individual Time Calculation (min): 60 min    Short Term Goals: Week 3:  OT Short Term Goal 1 (Week 3): STG=LTG secondary to ELOS  Skilled Therapeutic Interventions/Progress Updates:    Pt completed all BADLs including bathing at shower level, toilet transfers/toileting, dressing with sit<>stand from EOB, grooming while standing at sink, and gathering dirty linens after shower.  Pt continues to require supervision for LB dressing tasks and shower transfers.  Pt is mod I for all other BADLs. Pt's wife was present throughout session and provided appropriate supervision, etc.   Therapy Documentation Precautions:  Precautions Precautions: Fall Precaution Comments: R sided weakness Restrictions Weight Bearing Restrictions: No   Pain:  Pt denies pain  See Function Navigator for Current Functional Status.   Therapy/Group: Individual Therapy  [name], Zayd Bonet Chappell 07/13/2015, 8:00 AM

## 2015-07-13 NOTE — Progress Notes (Signed)
[facility] PHYSICAL MEDICINE & REHABILITATION     PROGRESS NOTE    Subjective/Complaints: Urinary urgency,odor. Continued progress with therapy  ROS: Pt denies CP, SOB, n/v/d.   Objective: Vital Signs: Blood pressure 103/55, pulse 74, temperature 99 F (37.2 C), temperature source Oral, resp. rate 16, weight 85.775 kg (189 lb 1.6 oz), SpO2 96 %. No results found. No results for input(s): WBC, HGB, HCT, PLT in the last 72 hours. No results for input(s): NA, K, CL, GLUCOSE, BUN, CREATININE, CALCIUM in the last 72 hours.  Invalid input(s): CO CBG (last 3)  No results for input(s): GLUCAP in the last 72 hours.  Wt Readings from Last 3 Encounters:  07/06/15 85.775 kg (189 lb 1.6 oz)  06/20/15 91.7 kg (202 lb 2.6 oz)   upper extremity.  Physical Exam:  Constitutional: He appears well-developed and well-nourished. Flat affect. Alert.    HENT:  Head: Normocephalic. Left eye and face trauma Eyes: Right eye exhibits no discharge.  Left ptosis. Right - normal in appearance.   Neck: Normal range of motion. Neck supple.  Cardiovascular: Normal rate and regular rhythm.  No murmur heard. Respiratory: Effort normal and breath sounds normal. No respiratory distress. He has no wheezes.  GI: Soft. Bowel sounds are normal. He exhibits no distension. There is no tenderness. No rebound Musculoskeletal: He exhibits tenderness.  Minimal edema in LUE.  Neurological: He is alert and oriented.  Perhaps trace eye lid lifting. Left pupil dilation decreased. Improving lateral>medial movement of eye.  Right facial weakness with mild to moderate dysarthria and right tongue deviation. Dense right hemiparesis of the UE: 2-/5 shoulder,  1+ to 2- biceps, triceps, trace finger and wrist flexion, 0 extension/HI. RLE: grossly 3+/5 HF,4-/5 KE, 2+/5 ADF/APF. Extensor tone seems minimal today RLE   Tight heel cord still 5/5 on the left upper and left lower extremity Sensation intact to light touch.    Psychiatric: His affect is blunt. His speech is delayed and slurred. He is slowed.   Assessment/Plan: 1. [facility] which require 3+ hours per day of interdisciplinary therapy in a comprehensive inpatient rehab setting. Physiatrist is providing close team supervision and 24 hour management of active medical problems listed below. Physiatrist and rehab team continue to assess barriers to discharge/monitor patient progress toward functional and medical goals.  Function:  Bathing Bathing position Bathing activity did not occur: Refused Position: Shower  Bathing parts Body parts bathed by patient: Right arm, Chest, Abdomen, Front perineal area, Right upper leg, Left upper leg, Right lower leg, Left lower leg, Buttocks, Left arm, Back Body parts bathed by helper: Back  Bathing assist Assist Level: More than reasonable time   Set up : To obtain items  Upper Body Dressing/Undressing Upper body dressing Upper body dressing/undressing activity did not occur: Refused What is the patient wearing?: Pull over shirt/dress     Pull over shirt/dress - Perfomed by patient: Thread/unthread left sleeve, Put head through opening, Pull shirt over trunk, Thread/unthread right sleeve Pull over shirt/dress - Perfomed by helper: Thread/unthread right sleeve        Upper body assist Assist Level: More than reasonable time   Set up : To obtain clothing/put away  Lower Body Dressing/Undressing Lower body dressing Lower body dressing/undressing activity did not occur: Refused What is the patient wearing?: Underwear, Pants, Socks, Shoes, AFO Underwear - Performed by patient: Thread/unthread left underwear leg, Pull underwear up/down, Thread/unthread right underwear leg Underwear - Performed by helper: Thread/unthread right  underwear leg Pants- Performed by patient: Thread/unthread left pants leg, Pull pants up/down, Thread/unthread right pants leg Pants- Performed by  helper: Thread/unthread right pants leg Non-skid slipper socks- Performed by patient: Don/doff left sock Non-skid slipper socks- Performed by helper: Don/doff right sock Socks - Performed by patient: Don/doff right sock, Don/doff left sock Socks - Performed by helper: Don/doff right sock Shoes - Performed by patient: Don/doff right shoe, Don/doff left shoe, Fasten right, Fasten left Shoes - Performed by helper: Don/doff right shoe, Fasten right AFO - Performed by patient: Don/doff right AFO AFO - Performed by helper: Don/doff right AFO      Lower body assist Assist for lower body dressing: Supervision or verbal cues      Toileting Toileting   Toileting steps completed by patient: Adjust clothing prior to toileting, Performs perineal hygiene, Adjust clothing after toileting Toileting steps completed by helper: Adjust clothing prior to toileting, Performs perineal hygiene, Adjust clothing after toileting Toileting Assistive Devices: Grab bar or rail  Toileting assist Assist level: More than reasonable time   Transfers Chair/bed transfer   Chair/bed transfer method: Ambulatory Chair/bed transfer assist level: Supervision or verbal cues Chair/bed transfer assistive device: Armrests, Cane, Orthosis     Locomotion Ambulation     Max distance: 300 Assist level: Supervision or verbal cues   Wheelchair   Type: Manual Max wheelchair distance: 150 Assist Level: No help, No cues, assistive device, takes more than reasonable amount of time  Cognition Comprehension Comprehension assist level: Follows complex conversation/direction with no assist  Expression Expression assist level: Expresses basic needs/ideas: With extra time/assistive device  Social Interaction Social Interaction assist level: Interacts appropriately with others with medication or extra time (anti-anxiety, antidepressant).  Problem Solving Problem solving assist level: Solves basic problems with no assist  Memory Memory  assist level: Recognizes or recalls 90% of the time/requires cueing < 10% of the time   Medical Problem List and Plan: 1. 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 DVT Prophylaxis/Anticoagulation: Mechanical: Sequential compression devices, below knee Bilateral lower extremities until discharge   -dopplers -negative 3. Pain Management:  continue oxycodone prn. encouraged use as needed.   -robaxin prn for spasms -  4. Mood: Monitor as mentation improves. LCSW to follow with patient for evaluation and support.  5. Neuropsych: This patient is not capable of making decisions on his own behalf. 6. Skin/Wound Care: Routine pressure relief measures. Monitor L-eye for resolution of edema.  7. Fluids/Electrolytes/Nutrition: Monitor I/O. continue nutritional supplements   -Sodium now WNL at 138 on 10/26. 8. Constipation:  Senna scheduled.    9. Urinary retention:Urecholine stopped--without issue  -off flomax also    10. Left retrobulbar hemorrhage with RAPD: eye clinically improved.. Pain appears to be under control.   -demonstrates improvement in medial movement and substantially with lateral movement  -perhaps mild lid opening now too, pupil less dilated  -reviewed plan/recovery at this point with father. Prognosis is guarded at best  11. Spasticity:   tone in RLE improved with AFO and increase mobility, stretching with therapies and on his own-  -continue baclofen for extensor tone in RLE- which has helped  LOS (Days) 20 A FACE TO FACE EVALUATION WAS PERFORMED  SWARTZ,ZACHARY T 07/13/2015 8:51 AM

## 2015-07-13 NOTE — Progress Notes (Signed)
Recreational Therapy Discharge Summary Patient Details  Name: Brandin Stetzer MRN: [id number] Date of Birth: 28-Nov-1985 Today's Date: 07/13/2015  Long term goals set: 2  Long term goals met: 2  Comments on progress toward goals: Pt has made excellent progress during LOS and is ready for discharge home with family.  Pt is discharging at overall Mod I level with exception of community mobility in which supervision is needed.  Family has been present throughout LOS observing & participating in therapies.   Reasons for discharge: discharge from hospital  Patient/family agrees with progress made and goals achieved: Yes  Mozella Rexrode 07/13/2015, 8:33 AM

## 2015-07-13 NOTE — Patient Care Conference (Signed)
Inpatient RehabilitationTeam Conference and Plan of Care Update Date: 07/12/2015   Time: 2:35 PM    Patient Name: Dakota [name]      Medical Record Number: [id number]  Date of Birth: 11-20-1985 Sex: Male         Room/Bed: 4M06C/4M06C-01 Payor Info: Payor: Advertising copywriter / Plan: Advertising copywriter OTHER / Product Type: *No Product type* /    Admitting Diagnosis: TBI , SAH  Admit Date/Time:  06/23/2015  4:20 PM Admission Comments: No comment available   Primary Diagnosis:  <principal problem not specified> Principal Problem: <principal problem not specified>  Patient Active Problem List   Diagnosis Date Noted  . Urinary retention 06/23/2015  . Acute respiratory failure (HCC) 06/23/2015  . Traumatic subarachnoid bleed with LOC of 6 hours to 24 hours (HCC) 06/23/2015  . Right hemiparesis (HCC) 06/23/2015  . Fall 06/22/2015  . Ocular trauma of right eye 06/22/2015  . TBI (traumatic brain injury) (HCC) 06/16/2015    Expected Discharge Date: Expected Discharge Date: 07/14/15  Team Members Present: Physician leading conference: Dr. Faith Rogue Social Worker Present: Amada Jupiter, LCSW Nurse Present: Carmie End, RN PT Present: Edman Circle, PT;Caitlin Penven-Crew, PT OT Present: Roney Mans, Domenic Schwab, OT;Ardis Rowan, COTA SLP Present: Feliberto Gottron, SLP PPS Coordinator present : Tora Duck, RN, [facility]     Current Status/Progress Goal Weekly Team Focus  Medical   improving motor. baclofen started for tone. eye with slow recovery  finalize dc planning,   eye, tapering bladder meds, orthotic   Bowel/Bladder   Continent fo bowel and bladder; LBM 07/10/15  Remain continent of bowel and bladder with min assist while in rehab.   Offereing toileting prn. Encourage use of stool softners.   Swallow/Nutrition/ Hydration             ADL's   supervision overall, mod I bathing and UB dressing, increased RUE functional use  mod I overall, LB dressing and shower  transfer-supervision  RUE NMR, functional transfers, safety awareness, family education   Mobility   supervision gait & stairs w/ SPC and AFO, min A community   mod I except supervision comm ambulation and stairs  RUE/LE NMR, balance, safety, gait, orthotic prescription, pt/family ed   Communication             Safety/Cognition/ Behavioral Observations            Pain   Occasional c/o pain.  Ultram 4x daily  Less than 4 on a scale of 0-10.   assess pain q 4 hrs and prn   Skin   Left eye swollen/bruised; Erithromycin ointment applied  Remain free of infection and skin breakdown.   assess skin q shift and prn      *See Care Plan and progress notes for long and short-term goals.  Barriers to Discharge: dense RUE weakness, tone RLE.     Possible Resolutions to Barriers:  baclofen, continued orthotic use, NMR    Discharge Planning/Teaching Needs:  home with wife and father providing 24/7 supervision  Teaching ongoing with wife and father.   Team Discussion:  Continues to show movement return in UE.  MD has added baclofen end of last week.  Some improved eye mvmt as well.  Excellent progress and reaching a supervision level with PT.  REady for d/c Thursday.  Revisions to Treatment Plan:  None   Continued Need for Acute Rehabilitation Level of Care: The patient requires daily medical management by a physician with specialized training in physical medicine  and rehabilitation for the following conditions: Daily direction of a multidisciplinary physical rehabilitation program to ensure safe treatment while eliciting the highest outcome that is of practical value to the patient.: Yes Daily medical management of patient stability for increased activity during participation in an intensive rehabilitation regime.: Yes Daily analysis of laboratory values and/or radiology reports with any subsequent need for medication adjustment of medical intervention for : Neurological problems;Post  surgical problems;Other  Dakota [name] 07/13/2015, 1:44 PM

## 2015-07-13 NOTE — Progress Notes (Signed)
Physical Therapy Session Note  Patient Details  Name: Dakota PartridgeJessie Lape MRN: [id number] Date of Birth: Nov 30, 1985  Today's Date: 07/13/2015 PT Individual Time: 0900-1000 PT Individual Time Calculation (min): 60 min   Short Term Goals: Week 3:  PT Short Term Goal 1 (Week 3): = LTGs of overall mod I except S community ambulation and stairs     Skilled Therapeutic Interventions/Progress Updates:  Pt seen for gait on level and unlevel terrain, 12 corner steps, floor transfer, neuro re-ed, R AFO.  Pt expressed discomfort with new personal AFO.  PT repositioned AFO in very back of shoe, and instructed pt in placing calf strap on with knee partially extended.  Pt satisfied and stated AFO felt better.  neuromuscular re-education via demo, VCs for RLE knee control to avoid hyperextension, without AFO for kicking ball in high unsupported sitting and in standing with LUE support; facilitation of ankle and hip strategies in standing during external perturbations at hips; pelvic dissociation in high unsupported sitting; RUE gross shoulder ER/abduction to slide small items off of a table in front, in sitting.  PROM R heel cord; remains tight with 0 degrees DF.    Gait to return to room; pt transferred to bed, and all needs left within reach.  Later, PT discussed AFO with [facility].  He trimmed AFO slightly on sides; pt satisfied with it. Therapy Documentation Precautions:  Precautions Precautions: Fall Precaution Comments: R sided weakness, UE>LE Restrictions Weight Bearing Restrictions: No   Pain: Pain Assessment Pain Score: no c/o       See Function Navigator for Current Functional Status.   Therapy/Group: Individual Therapy  Jacarius Handel 07/13/2015, 4:46 PM

## 2015-07-13 NOTE — Progress Notes (Signed)
Occupational Therapy Discharge Summary  Patient Details  Name: Dakota [name] MRN: [id number] Date of Birth: 09-05-1986   Patient has met 12 of 12 long term goals due to improved activity tolerance, improved balance, postural control, ability to compensate for deficits, functional use of  RIGHT upper and RIGHT lower extremity, improved attention, improved awareness and improved coordination.  Pt made steady progress with BADLs during this admission.  Pt is mod I for bathing tasks, toilet transfers/toileting, UB dressing, and grooming tasks.  Pt is supervision for LB dressing tasks.  Pt has exhibited improved Rt shoulder flexion/extension, elbow flexion/extension, and wrist/hand flexion/extension but is not currently using RUE in functional task.  Pt is using his RUE as a stabilizer during BADLs. Pt is unable to open his left eyelid but when manually opened pt is able to track horizontally but not vertically.  Pt's wife has been present and participated in therapies. Pt exhibits behaviors consistent with [place] Level VIII. Patient to discharge at overall mod I to supervision  level.  Patient's care partner is independent to provide the necessary physical and cognitive assistance at discharge.  Pt currently demonstrates behaviors consistent with [place] Level VIII.    Recommendation:  Patient will benefit from ongoing skilled OT services in outpatient setting to continue to advance functional skills in the area of BADL and iADL.  Equipment: No equipment providedPt has access to tub transfer bench  Reasons for discharge: treatment goals met and discharge from hospital  Patient/family agrees with progress made and goals achieved: Yes  OT Discharge  Vision/Perception  Vision- History Baseline Vision/History: No visual deficits Patient Visual Report: Blurring of vision (left eye when eyelid opened) Vision- Assessment Vision Assessment?: Yes Eye Alignment: Within Functional Limits Ocular Range of  Motion: Within Functional Limits Alignment/Gaze Preference: Within Defined Limits Tracking/Visual Pursuits: Other (comment) (L eye with decreased smoothness with vertical and horizontal tracking) Saccades: Additional eye shifts occurred during testing Visual Fields: No apparent deficits Additional Comments: L eye injury, pt can manually open eyelid and is able to see with slightly blurred vision, pupil dilated   Cognition Overall Cognitive Status: Within Functional Limits for tasks assessed Arousal/Alertness: Awake/alert Orientation Level: Oriented X4 Attention: Divided Selective Attention: Appears intact Selective Attention Impairment: Verbal complex;Functional complex Alternating Attention: Appears intact Divided Attention: Appears intact Memory: Impaired Memory Impairment: Decreased recall of new information Decreased Short Term Memory: Verbal complex;Functional complex Awareness: Impaired Awareness Impairment: Emergent impairment Problem Solving: Appears intact Problem Solving Impairment: Verbal complex;Other (comment) Self Monitoring: Appears intact Self Correcting: Appears intact Behaviors: Impulsive Safety/Judgment: Appears intact [place] [employer] Scales of Cognitive Functioning: Purposeful/appropriate Sensation Sensation Light Touch: Appears Intact Stereognosis: Appears Intact Hot/Cold: Appears Intact Proprioception: Impaired by gross assessment Proprioception Impaired Details: Impaired RUE;Impaired RLE Coordination Gross Motor Movements are Fluid and Coordinated: No Fine Motor Movements are Fluid and Coordinated: No Coordination and Movement Description: R UE > LE hemiparesis Motor  Motor Motor: Hemiplegia Motor - Skilled Clinical Observations: R hemiparesis Motor - Discharge Observations: slight tone in R hand with activity Mobility  Bed Mobility Bed Mobility:  (modified independent for all)  Trunk/Postural Assessment  Cervical Assessment Cervical  Assessment: Within Functional Limits Thoracic Assessment Thoracic Assessment: Within Functional Limits Lumbar Assessment Lumbar Assessment: Within Functional Limits Postural Control Postural Control: Within Functional Limits  Balance Static Sitting Balance Static Sitting - Level of Assistance: 7: Independent Dynamic Sitting Balance Dynamic Sitting - Level of Assistance: 7: Independent Extremity/Trunk Assessment RUE Assessment RUE Assessment: Exceptions to [facility] RUE AROM (degrees) RUE  Overall AROM Comments: active movement noted in shoulder, elbow, wrist, and fingers with compensatory techniques RUE PROM (degrees) RUE Overall PROM Comments: WFL RUE Tone RUE Tone Comments: increased tone finger flexiors LUE Assessment LUE Assessment: Within Functional Limits   See Function Navigator for Current Functional Status.  [name] [facility] 07/13/2015, 10:54 AM

## 2015-07-13 NOTE — Progress Notes (Signed)
Occupational Therapy Note  Patient Details  Name: Dakota [name] MRN: [id number] Date of Birth: 07-22-1986  Today's Date: 07/13/2015 OT Individual Time: 1300-1415 OT Individual Time Calculation (min): 75 min  and Today's Date: 07/13/2015 OT Missed Time: 15 Minutes Missed Time Reason: Patient fatigue  Pt denied pain Individual Therapy  Pt resting in bed upon arrival with wife present.  Pt stated he wasn't feeling well today but agreeable to therapy.  Pt amb with SPC to therapy gym and initially engaged in RUE therapeutic activities sitting EOM and supine.  Focus on shoulder flexion/extension, elbow flexion/exension, wrist flexion/extension, and hand grasp/release.  Pt continues to exhibit incremental improvements with quality movement each day.  Pt transitioned to ADL apartment to practice tub bench transfers, home mgmt tasks, and kitchen activities.  Focus on safety awareness.     Lavone NeriLanier, Rachelle Edwards [facility] 07/13/2015, 2:26 PM

## 2015-07-13 NOTE — Discharge Summary (Signed)
Physician Discharge Summary  Patient ID: Dakota [name] MRN: [id number] DOB/AGE: 12/11/1985 29 y.o.  Admit date: 06/23/2015 Discharge date: 07/14/2015  Discharge Diagnoses:  Principal Problem:   TBI (traumatic brain injury) (HCC) Active Problems:   Ocular trauma of right eye   Urinary retention   Traumatic subarachnoid bleed with LOC of 6 hours to 24 hours (HCC)   Right hemiparesis (HCC)   Discharged Condition: Stable.     Labs:  Basic Metabolic Panel: BMP Latest Ref Rng 07/06/2015 06/24/2015 06/20/2015  Glucose 65 - 99 mg/dL 109(H) 123(H) 92  BUN 6 - 20 mg/dL 12 12 <5(L)  Creatinine 0.61 - 1.24 mg/dL 0.76 0.87 0.71  Sodium 135 - 145 mmol/L 138 134(L) 135  Potassium 3.5 - 5.1 mmol/L 4.1 3.9 4.0  Chloride 101 - 111 mmol/L 100(L) 96(L) 102  CO2 22 - 32 mmol/L 23 28 25  Calcium 8.9 - 10.3 mg/dL 9.7 9.2 8.6(L)    Hepatic Function Latest Ref Rng 06/24/2015 06/19/2015 06/16/2015  Total Protein 6.5 - 8.1 g/dL 6.6 - 6.5  Albumin 3.5 - 5.0 g/dL 3.4(L) 2.8(L) 4.1  AST 15 - 41 U/L 57(H) - 22  ALT 17 - 63 U/L 124(H) - 29  Alk Phosphatase 38 - 126 U/L 59 - 58  Total Bilirubin 0.3 - 1.2 mg/dL 0.5 - 0.5     CBC: CBC Latest Ref Rng 07/06/2015 06/24/2015 06/20/2015  WBC 4.0 - 10.5 K/uL 8.6 8.3 7.7  Hemoglobin 13.0 - 17.0 g/dL 13.1 14.1 12.1(L)  Hematocrit 39.0 - 52.0 % 40.1 40.9 36.7(L)  Platelets 150 - 400 K/uL 289 267 164     CBG: No results for input(s): GLUCAP in the last 168 hours.  Brief HPI:   Dakota [name] is a 29 y.o. male was admitted on 06/16/15 after being found by wife in the garage with left eye shut with decrease in LOC due to falling on car antenna. UDS negative. He was evaluated in ED and had decline in MS requiring intubation for airway protection. Patient noted to have right sided weakness with injected and swollen left eye with dilated/fixed pupil and sluggish right pupil. 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  Right ICP placed by Dr. Jones  and CTA head was negative for acute vascular injury or aneurysm. He was evaluated by Dr. 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 He was extubated without difficulty on 10/09. He has had problems with urinary retention requiring in and out caths. Mentation is improving diet has been upgraded to dysphagia 3, thin liquids. Therapy ongoing and CIR was recommended for follow up therapy.     Hospital Course: Dakota [name] was admitted to rehab 06/23/2015 for inpatient therapies to consist of PT, ST and OT at least three hours five days a week. Past admission physiatrist, therapy team and rehab RN have worked together to provide customized collaborative inpatient rehab. Mentation has steadily improved with decrease in headaches. Blood pressures have been monitored on bid basis and have been stable. BLE dopplers were negative for DVT and SCDs were used for DVT prophylaxis. Headaches have been managed with use of   oxycodone on prn basis.  Spasticity RLE has improved with increase in mobility and stretching in addition to use of baclofen.  Bladder function was monitored with PVR checks and has improved.  Urecholine was weaned off and flomax was discontinued without recurrent voiding issues.  Hyponatremia was noted on admission and has resolved on repeat labs. Mild elevation in LFTs noted without GI symptoms. Sennakot has been effective in management of constipation.  Dysphagia has improved and he is tolerating regular diet without difficulty. Po intake is good and he is continent of bowel  and bladder.  Left eye lid edema is resolving with decrease in left pupil dilation and improvement in lateral > medial movement of the eye. RLE shows improvement in strength and right knee control. He was fitted with R-AFO to help with gait.   He has made steady progress during his rehab stay and is independent in supervised setting with use of a cane. He will continue to receive follow up Outpatient PT, OT and ST at [facility] after discharge.    Rehab course: During patient's stay in rehab weekly team conferences were held to monitor patient's progress, set goals and discuss barriers to discharge. At admission, patient required moderate assistance with basic self-care tasks and mobility.  He has mild cognitive deficits affecting memory, funcitonal problem solving and well as mild to moderate dysarthria with decrease in vocal intensity. He has had improvement in activity tolerance, balance, postural control, as well as ability to compensate for deficits. He  is has had improvement in functional use RUE  and RLE as well as improved awareness. Speech is 100% intelligible and he is able to complete mild complex and familiar tasks at modified independent to supervision level.He is tolerating regular textures with s/s of aspiration and is able to use safe swallow strategies independently.  Cognition is at baseline and speech therapy signed of on 10/27.   He is modified independent for bathing and UB dressing. He requires supervision for LB dressing and shower transfers.  He is ambulating 150' with SPC in controlled enviroment.     Disposition: 01-Home or Self Care  Diet: Regular  Special Instructions: 1. Recheck CMET in 2 weeks.  2. To go by [facility] to complete paperwork to set up for primary care provider.      Medication List    TAKE these medications        amoxicillin 250 MG capsule  Commonly known as:  AMOXIL  Take 1 capsule (250 mg total) by mouth every 8 (eight)  hours.     baclofen 10 MG tablet  Commonly known as:  LIORESAL  Take 1 tablet (10 mg total) by mouth 2 (two) times daily.     erythromycin ophthalmic ointment  Place into the left eye 3 (three) times daily.     traMADol 50 MG tablet--Rx # 45 pills   Commonly known as:  ULTRAM  Take 1 tablet (50 mg total) by mouth every 12 (twelve) hours as needed.       Follow-up Information    Follow up with SWARTZ,ZACHARY T, MD On 08/09/2015.   Specialty:  Physical Medicine and Rehabilitation   Why:  BE there at 10:40 am for 11 am appointment   Contact information:   [address], [address] [place] [address] [phone]       Follow up with BOWEN,BRADLEY, MD. Call today.   Specialty:  Ophthalmology   Why:  for follow up appointment     Contact information:   [place] [id number] [phone]       Signed: Bary Leriche 07/18/2015, 4:37 PM

## 2015-07-14 DIAGNOSIS — R339 Retention of urine, unspecified: Secondary | ICD-10-CM

## 2015-07-14 DIAGNOSIS — S0591XS Unspecified injury of right eye and orbit, sequela: Secondary | ICD-10-CM

## 2015-07-14 MED ORDER — ERYTHROMYCIN 5 MG/GM OP OINT: TOPICAL_OINTMENT | Freq: Three times a day (TID) | OPHTHALMIC | Status: DC

## 2015-07-14 MED ORDER — AMOXICILLIN 250 MG PO CAPS: 250.0000 mg | ORAL_CAPSULE | Freq: Three times a day (TID) | ORAL | Status: DC

## 2015-07-14 MED ORDER — BACLOFEN 10 MG PO TABS: 10.0000 mg | ORAL_TABLET | Freq: Two times a day (BID) | ORAL | Status: DC

## 2015-07-14 MED ORDER — TRAMADOL HCL 50 MG PO TABS: 50.0000 mg | ORAL_TABLET | Freq: Two times a day (BID) | ORAL | Status: DC | PRN

## 2015-07-14 MED ORDER — AMOXICILLIN 250 MG PO CAPS
250.0000 mg | ORAL_CAPSULE | Freq: Three times a day (TID) | ORAL | Status: DC
  Administered 2015-07-14: 250 mg via ORAL
  Filled 2015-07-14 (×3): qty 1

## 2015-07-14 NOTE — Progress Notes (Signed)
Patient and father received discharge instructions from Marissa NestlePam Love, PA-C with verbal understanding. Patient discharged to home with family and belongings.

## 2015-07-14 NOTE — Discharge Instructions (Signed)
Inpatient Rehab Discharge Instructions  Dakota PartridgeJessie [name] Discharge date and time:  07/14/15  Activities/Precautions/ Functional Status: Activity: No lifting, driving, or strenuous exercise till cleared by MD Diet: regular diet Wound Care: keep wound clean and dry   Functional status:  ___ No restrictions     ___ Walk up steps independently _X__ 24/7 supervision/assistance   ___ Walk up steps with assistance ___ Intermittent supervision/assistance  ___ Bathe/dress independently ___ Walk with walker     _X__ Bathe/dress with assistance ___ Walk Independently    ___ Shower independently ___ Walk with assistance    ___ Shower with assistance _X__ No alcohol     ___ Return to work/school ________   COMMUNITY REFERRALS UPON DISCHARGE:    Outpatient: PT     OT                   Agency:  [facility]    Phone: [phone]                Appointment Date/Time:  07/18/15 @ 2:00 - 3:30 (arrive at 1:45 pm)    GENERAL COMMUNITY RESOURCES FOR PATIENT/FAMILY:  Support Groups: Brain Injury Support Group    Caregiver Support: same      Special Instructions: 1. [facility] provider.     My questions have been answered and I understand these instructions. I will adhere to these goals and the provided educational materials after my discharge from the hospital.  Patient/Caregiver Signature _______________________________ Date __________  Clinician Signature _______________________________________ Date __________  Please bring this form and your medication list with you to all your follow-up doctor's appointments.

## 2015-07-14 NOTE — Progress Notes (Signed)
[facility] PHYSICAL MEDICINE & REHABILITATION     PROGRESS NOTE    Subjective/Complaints: Still with some urinary symptoms. Having mild hypogastric pain. Tired from being up watching game last night  ROS: Pt denies CP, SOB, n/v/d.   Objective: Vital Signs: Blood pressure 110/64, pulse 83, temperature 98.6 F (37 C), temperature source Oral, resp. rate 16, weight 84.505 kg (186 lb 4.8 oz), SpO2 98 %. No results found. No results for input(s): WBC, HGB, HCT, PLT in the last 72 hours. No results for input(s): NA, K, CL, GLUCOSE, BUN, CREATININE, CALCIUM in the last 72 hours.  Invalid input(s): CO CBG (last 3)  No results for input(s): GLUCAP in the last 72 hours.  Wt Readings from Last 3 Encounters:  07/13/15 84.505 kg (186 lb 4.8 oz)  06/20/15 91.7 kg (202 lb 2.6 oz)   upper extremity.  Physical Exam:  Constitutional: He appears well-developed and well-nourished. Flat affect. Alert.    HENT:  Head: Normocephalic. Left eye and face trauma Eyes: Right eye exhibits no discharge.  Left ptosis. Right - normal in appearance.   Neck: Normal range of motion. Neck supple.  Cardiovascular: Normal rate and regular rhythm.  No murmur heard. Respiratory: Effort normal and breath sounds normal. No respiratory distress. He has no wheezes.  GI: Soft. Bowel sounds are normal. He exhibits no distension. There is no tenderness. No rebound Musculoskeletal: He exhibits tenderness.  Minimal edema in LUE.  Neurological: He is alert and oriented.  Perhaps trace eye lid lifting. Left pupil dilation decreased. Improving lateral>medial movement of eye.  Right facial weakness with mild to moderate dysarthria and right tongue deviation. Dense right hemiparesis of the UE: 2-/5 shoulder,  1+ to 2- biceps, triceps, trace finger and wrist flexion, 0 extension/HI. RLE: grossly 3+/5 HF,4-/5 KE, 2+/5 ADF/APF. Extensor tone seems minimal today RLE   Tight heel cord still 5/5 on the left upper and left  lower extremity Sensation intact to light touch.   Psychiatric: His affect is blunt. His speech is delayed and slurred. He is slowed.   Assessment/Plan: 1. Functional deficits secondary to TBI with [facility] which require 3+ hours per day of interdisciplinary therapy in a comprehensive inpatient rehab setting. Physiatrist is providing close team supervision and 24 hour management of active medical problems listed below. Physiatrist and rehab team continue to assess barriers to discharge/monitor patient progress toward functional and medical goals.  Function:  Bathing Bathing position Bathing activity did not occur: Refused Position: Shower  Bathing parts Body parts bathed by patient: Right arm, Chest, Abdomen, Front perineal area, Right upper leg, Left upper leg, Right lower leg, Left lower leg, Buttocks, Left arm, Back Body parts bathed by helper: Back  Bathing assist Assist Level: More than reasonable time   Set up : To obtain items  Upper Body Dressing/Undressing Upper body dressing Upper body dressing/undressing activity did not occur: Refused What is the patient wearing?: Pull over shirt/dress     Pull over shirt/dress - Perfomed by patient: Thread/unthread left sleeve, Put head through opening, Pull shirt over trunk, Thread/unthread right sleeve Pull over shirt/dress - Perfomed by helper: Thread/unthread right sleeve        Upper body assist Assist Level: More than reasonable time   Set up : To obtain clothing/put away  Lower Body Dressing/Undressing Lower body dressing Lower body dressing/undressing activity did not occur: Refused What is the patient wearing?: Underwear, Pants, Socks, Shoes, AFO Underwear - Performed by patient: Thread/unthread left underwear leg, Pull underwear up/down,  Thread/unthread right underwear leg Underwear - Performed by helper: Thread/unthread right underwear leg Pants- Performed by patient: Thread/unthread left pants leg, Pull pants up/down,  Thread/unthread right pants leg Pants- Performed by helper: Thread/unthread right pants leg Non-skid slipper socks- Performed by patient: Don/doff left sock Non-skid slipper socks- Performed by helper: Don/doff right sock Socks - Performed by patient: Don/doff right sock, Don/doff left sock Socks - Performed by helper: Don/doff right sock Shoes - Performed by patient: Don/doff right shoe, Don/doff left shoe, Fasten right, Fasten left Shoes - Performed by helper: Don/doff right shoe, Fasten right AFO - Performed by patient: Don/doff right AFO AFO - Performed by helper: Don/doff right AFO      Lower body assist Assist for lower body dressing: Supervision or verbal cues      Toileting Toileting   Toileting steps completed by patient: Adjust clothing prior to toileting, Performs perineal hygiene, Adjust clothing after toileting Toileting steps completed by helper: Adjust clothing prior to toileting, Performs perineal hygiene, Adjust clothing after toileting Toileting Assistive Devices: Grab bar or rail  Toileting assist Assist level: More than reasonable time   Transfers Chair/bed transfer   Chair/bed transfer method: Ambulatory Chair/bed transfer assist level: No Help, no cues, assistive device, takes more than a reasonable amount of time Chair/bed transfer assistive device: Cane, Orthosis     Locomotion Ambulation     Max distance: 150 Assist level: No help, No cues, assistive device, takes more than a reasonable amount of time   Wheelchair Wheelchair activity did not occur: N/A Type: Manual Max wheelchair distance: 150 Assist Level: No help, No cues, assistive device, takes more than reasonable amount of time  Cognition Comprehension Comprehension assist level: Follows complex conversation/direction with no assist  Expression Expression assist level: Expresses basic needs/ideas: With extra time/assistive device  Social Interaction Social Interaction assist level: Interacts  appropriately with others with medication or extra time (anti-anxiety, antidepressant).  Problem Solving Problem solving assist level: Solves basic problems with no assist  Memory Memory assist level: Recognizes or recalls 90% of the time/requires cueing < 10% of the time   Medical Problem List and Plan: 1. Functional deficits secondary to TBI with SAH     -dc home today. Goals met  -follow up with optho, me, pcp in office 2. DVT Prophylaxis/Anticoagulation: Mechanical: Sequential compression devices, below knee Bilateral lower extremities until discharge   -dopplers -negative 3. Pain Management:  continue oxycodone prn. encouraged use as needed.   -robaxin prn for spasms -  4. Mood: Monitor as mentation improves. LCSW to follow with patient for evaluation and support.  5. Neuropsych: This patient is not capable of making decisions on his own behalf. 6. Skin/Wound Care: Routine pressure relief measures. Monitor L-eye for resolution of edema.  7. Fluids/Electrolytes/Nutrition: Monitor I/O. continue nutritional supplements   -Sodium now WNL at 138 on 10/26. 8. Constipation:  Senna scheduled.    9. Urinary retention:Urecholine stopped--without issue  -off flomax also   -ua equivocal but having some hypogastric discomfort, still with frequency  -will go ahead and initiate amoxil, pending ucx 10. Left retrobulbar hemorrhage with RAPD: eye clinically improved.. Pain appears to be under control.   -demonstrates improvement in medial movement and substantially with lateral movement  -perhaps mild lid opening now too, pupil less dilated  -reviewed plan/recovery at this point with father. Prognosis is guarded at best  11. Spasticity:   tone in RLE improved with AFO and increase mobility, stretching with therapies and on his own-  -continue  baclofen for extensor tone in RLE- which has helped  LOS (Days) 21 A FACE TO FACE EVALUATION WAS PERFORMED  SWARTZ,ZACHARY T 07/14/2015 8:15 AM

## 2015-07-14 NOTE — Progress Notes (Signed)
Social Work  Discharge Note  The overall goal for the admission was met for:   Discharge location: Yes - home with wife who will share 24/7 assistance with pt's father  Length of Stay: Yes - 21 days  Discharge activity level: Yes - supervision  Home/community participation: Yes  Services provided included: MD, RD, PT, OT, SLP, RN, TR, Pharmacy, Neuropsych and SW  Financial Services: Private Insurance: [facility]  Follow-up services arranged: Outpatient: PT, OT @ [facility], DME: wife purchased straight cane, Other: provided handout and instruction on securing a new primary care MD @ [facility] and Patient/Family has no preference for HH/DME agencies  Comments (or additional information):  Patient/Family verbalized understanding of follow-up arrangements: Yes  Individual responsible for coordination of the follow-up plan: pt  Confirmed correct DME delivered: August Longest 07/14/2015    Nylan Nevel

## 2015-07-16 LAB — URINE CULTURE: Culture: >=100000

## 2015-07-18 ENCOUNTER — Ambulatory Visit: Payer: 59 | Admitting: Occupational Therapy

## 2015-07-18 ENCOUNTER — Encounter: Payer: Self-pay | Admitting: Physical Therapy

## 2015-07-18 ENCOUNTER — Ambulatory Visit: Payer: 59 | Attending: Physical Medicine & Rehabilitation | Admitting: Physical Therapy

## 2015-07-18 DIAGNOSIS — G8191 Hemiplegia, unspecified affecting right dominant side: Secondary | ICD-10-CM | POA: Diagnosis present

## 2015-07-18 DIAGNOSIS — R2681 Unsteadiness on feet: Secondary | ICD-10-CM | POA: Diagnosis present

## 2015-07-18 DIAGNOSIS — R4189 Other symptoms and signs involving cognitive functions and awareness: Secondary | ICD-10-CM

## 2015-07-18 DIAGNOSIS — R41840 Attention and concentration deficit: Secondary | ICD-10-CM | POA: Insufficient documentation

## 2015-07-18 DIAGNOSIS — R279 Unspecified lack of coordination: Secondary | ICD-10-CM | POA: Insufficient documentation

## 2015-07-18 DIAGNOSIS — R258 Other abnormal involuntary movements: Secondary | ICD-10-CM | POA: Diagnosis present

## 2015-07-18 DIAGNOSIS — R41842 Visuospatial deficit: Secondary | ICD-10-CM | POA: Insufficient documentation

## 2015-07-18 DIAGNOSIS — R269 Unspecified abnormalities of gait and mobility: Secondary | ICD-10-CM | POA: Diagnosis present

## 2015-07-18 DIAGNOSIS — R252 Cramp and spasm: Secondary | ICD-10-CM

## 2015-07-18 NOTE — Therapy (Signed)
[facility] [address] [place], [place], [address] Phone: [phone]   Fax:  [phone]  Physical Therapy Evaluation  Patient Details  Name: Dakota [name] MRN: [id number] Date of Birth: [birth year] Referring Provider: Elouise Munroe, MD  Encounter Date: 07/18/2015      PT End of Session - 07/18/15 1822    Visit Number 1   Number of Visits 17  eval + 16 visits   Date for PT Re-Evaluation 09/16/15   Authorization Type UHC   PT Start Time 1446   PT Stop Time 1530   PT Time Calculation (min) 44 min   Activity Tolerance Patient tolerated treatment well   Behavior During Therapy Impulsive      Past Medical History  Diagnosis Date  . Hx of renal calculi     History reviewed. No pertinent past surgical history.  There were no vitals filed for this visit.  Visit Diagnosis:  Abnormality of gait - Plan: PT plan of care cert/re-cert  Unsteadiness - Plan: PT plan of care cert/re-cert  Right hemiparesis [facility]) - Plan: PT plan of care cert/re-cert      Subjective Assessment - 07/18/15 1453    Subjective Pt hospitalized on 06/16/15 for TBI with SAH, L eye injury (retrobulbar hemorrhage). Transferred to inpatient rehab on 10/13, where he remained until discharged home on 07/14/15. Prior to hospitalization, pt was independent for all mobility and worked FT on [employer]. Pt now utilizes [facility] and R AFO for all mobility. 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 Also unable to open L eye; when L eye opened manually, pt reports distorted vision. Wife reports pt impulsivity when negotiating 9 stairs to enter home. Also reports pt walks to/from bathroom without AD or AFO, which wife states is "scary".     Patient is accompained by: Family member  wife, Carlena Sax   Pertinent History SAH (06/16/15); L eye contusion, retrobulbar hemorrhage s/p canthotomy   Patient Stated Goals "Just to walk better; walk  without the cane or the bracel and to use the arm again."   Currently in Pain? No/denies            [facility] PT Assessment - 07/18/15 0001    Assessment   Medical Diagnosis TBI with [facility]   Referring Provider Elouise Munroe, MD   Onset Date/Surgical Date 11/14/14   Hand Dominance Right   Precautions   Precautions Fall   Required Braces or Orthoses Other Brace/Splint   Other Brace/Splint R AFO (PLS)   Restrictions   Weight Bearing Restrictions No   Balance Screen   Has the patient fallen in the past 6 months Yes   How many times? 1   Prior Function   Level of Independence Independent   Vocation Full time employment   Health visitor   Leisure basketball; cars; [employer] fan   Cognition   Overall Cognitive Status Impaired/Different from baseline   Area of Impairment Safety/judgement   Safety/Judgement Decreased awareness of safety   Problem Solving --   Coordination   Gross Motor Movements are Fluid and Coordinated No   Fine Motor Movements are Fluid and Coordinated No   Tone   Assessment Location Right Lower Extremity   ROM / Strength   AROM / PROM / Strength Strength   Strength   Overall Strength Deficits   Overall Strength Comments RLE grossly 4-/5 in all joints, all planes, with exception of 3-/5 R ankle DF, eversion.  Ambulation/Gait   Ambulation/Gait Yes   Ambulation/Gait Assistance 5: Supervision   Assistive device Straight cane   Gait Pattern Step-through pattern;Decreased arm swing - right;Decreased stance time - right;Decreased hip/knee flexion - right;Decreased dorsiflexion - right;Decreased weight shift to right;Right circumduction;Right foot flat;Decreased trunk rotation;Wide base of support;Abducted- right   Ambulation Surface Level;Indoor   Gait velocity 1.97 ft/sec   Stairs Assistance Details (indicate cue type and reason) Pt initially ascended stairs with reciprocal pattern, exhibting poor awareness of RLE positioning on step, inconsistent RLE  clearance. Pt able to negotiate stairs with supervision when utilizing step-to pattern.   RLE Tone   RLE Tone Other (comment);Modified Ashworth  clonus x3 beats R soleus   RLE Tone   Modified Ashworth Scale for Grading Hypertonia RLE Slight increase in muscle tone, manifested by a catch and release or by minimal resistance at the end of the range of motion when the affected part(s) is moved in flexion or extension  R hamstrings                   [facility] Adult PT Treatment/Exercise - 07/18/15 0001    Ambulation/Gait   Ambulation Distance (Feet) 210 Feet   Stairs Yes   Stairs Assistance 5: Supervision;4: Min guard   Stair Management Technique One rail Left;Alternating pattern;Step to pattern;Forwards   Number of Stairs 8   Height of Stairs 6                PT Education - 07/18/15 1612    Education provided Yes   Education Details PT eval findings, goals, and POC. Recommending pt negotiate stairs to enter home using step-to pattern at this time.   Person(s) Educated Patient;Spouse   Methods Explanation   Comprehension Verbalized understanding          PT Short Term Goals - 07/18/15 1829    PT SHORT TERM GOAL #1   Title Pt will perform initial HEP with mod I using paper handout to maximize functional gains made in PT. Target date: 08/15/15   PT SHORT TERM GOAL #2   Title Pt will negotiate 9 stairs with single rail and mod I (no cueing for safety awareness) to indicate safety using primary home entrance. Target date: 08/15/15   PT SHORT TERM GOAL #3   Title Pt will increase gait velocity from 1.97 ft/sec to 2.27 ft/sec to indicate increase efficiency of ambulation. Target date: 08/15/15   PT SHORT TERM GOAL #4   Title Pt will independently ambulate 200' without AD using AFO to progress toward PLOF and pt-stated goal of independent ambulation. Target date: 08/15/15   PT SHORT TERM GOAL #5   Title Pt will ambulate 500' over unlevel, paved surfaces with mod I using LRAD  to indicate increased safety with limited community mobility. Target date: 08/15/15   Additional Short Term Goals   Additional Short Term Goals Yes   PT SHORT TERM GOAL #6   Title Complete DGI to assess dynamic gait stability. Target date: 08/15/15   PT SHORT TERM GOAL #7   Title                PT Long Term Goals - 07/18/15 1838    PT LONG TERM GOAL #1   Title Pt will increase gait velocity from 1.97 ft/sec to > 2.62 ft/sec to indicate status of community ambulator. Target date: 09/12/15   PT LONG TERM GOAL #2   Title Pt will score >/= 20/24 on DGI to indicate decreased  risk of falling. Target date: 09/12/15   PT LONG TERM GOAL #3   Title Pt will independently negotiate 9 stairs with single rail and reciprocal pattern to indicate pt independence using primary home entrance. Target date: 09/12/15   PT LONG TERM GOAL #4   Title Pt will independently ambulate 200' over level, indoor surfaces without assistive device or AFO to progress toward PLOF as independent with household ambulation. Target date: 09/12/15   PT LONG TERM GOAL #5   Title Pt will ambulate >1,000' over outdoor, paved surfaces with mod I using LRAD to indicate increased independence with commnuity mobility. Target date: 09/12/15   Additional Long Term Goals   Additional Long Term Goals Yes   PT LONG TERM GOAL #6   Title Pt will negotiate standard ramp and curb step with mod I using LRAD to indicate safety traversing community obstacles. Target date: 09/12/15               Plan - 07/18/15 1905    Clinical Impression Statement Pt is a 29 y/o M referred to outpatient neuro PT to address functional impairments associated with L SAH (06/16/15), L orbital contusion s/p L canthotomy. Pt hospitalized from 10/6 - 07/14/15 including inpatient rehab from 10/13/- 07/14/15. PT evaluation reveals the following: R hemiparesis; RLE spasticity; gait impairments; impulsivity, decreased safety awareness during functional mobility; impaired selective  control in RLE; inattention to RLE during functional mobility; gait speed suggesting status of limited community ambulator. Pt willl benefit from outpatient PT 2x/week for up to 8 weeks to address said impairments.    Pt will benefit from skilled therapeutic intervention in order to improve on the following deficits Abnormal gait;Decreased cognition;Decreased balance;Decreased strength;Decreased safety awareness;Impaired vision/preception;Impaired UE functional use;Impaired tone   Rehab Potential Good   Clinical Impairments Affecting Rehab Potential decreased safety awareness; impaired vision in L eye   PT Frequency 2x / week   PT Duration 8 weeks   PT Treatment/Interventions ADLs/Self Care Home Management;Electrical Stimulation;DME Instruction;Gait training;Stair training;Functional mobility training;Therapeutic activities;Therapeutic exercise;Balance training;Neuromuscular re-education;Vestibular;Visual/perceptual remediation/compensation   PT Next Visit Plan Perform DGI; initiate HEP   Consulted and Agree with Plan of Care Patient;Family member/caregiver   Family Member Consulted wife, Carlena Sax         Problem List Patient Active Problem List   Diagnosis Date Noted  . Urinary retention 06/23/2015  . Acute respiratory failure (HCC) 06/23/2015  . Traumatic subarachnoid bleed with LOC of 6 hours to 24 hours (HCC) 06/23/2015  . Right hemiparesis (HCC) 06/23/2015  . Fall 06/22/2015  . Ocular trauma of right eye 06/22/2015  . TBI (traumatic brain injury) (HCC) 06/16/2015    Jorje Guild, PT, DPT [facility] [address] [place], [place], [address] Phone: [phone]   Fax:  [phone] 07/18/2015, 7:18 PM   Name: Brainard Highfill MRN: [id number] Date of Birth: 04/12/1986

## 2015-07-20 NOTE — Therapy (Signed)
[facility] [address] [place], [place], [address] Phone: [phone]   Fax:  [phone]  Occupational Therapy Evaluation  Patient Details  Name: Dakota [name] MRN: [id number] Date of Birth: 03-May-1986 No Data Recorded  Encounter Date: 07/18/2015      OT End of Session - 07/20/15 1746    Visit Number 1   Number of Visits 17   Date for OT Re-Evaluation 09/16/15   Authorization Type UHC, awaiting verification of benefits   OT Start Time 1538   OT Stop Time 1617   OT Time Calculation (min) 39 min   Activity Tolerance Patient tolerated treatment well   Behavior During Therapy Restless      Past Medical History  Diagnosis Date  . Hx of renal calculi     No past surgical history on file.  There were no vitals filed for this visit.  Visit Diagnosis:  Right hemiparesis (HCC)  Lack of coordination  Unsteadiness  Cognitive deficits  Spasticity  Visuospatial deficit  Inattention      Subjective Assessment - 07/20/15 1813    Subjective  Wife reports that pt ignores RUE   Patient is accompained by: Family member  wife   Pertinent History Pt hospitalized on 06/16/15 for TBI with SAH, L eye injury (retrobulbar hemorrhage). Transferred to inpatient rehab on 10/13, where he remained until discharged home on 07/14/15.   Patient Stated Goals use RUE again           [facility] OT Assessment - 07/20/15 0001    Assessment   Diagnosis TBI with SAH, L eye orbit contusion s/p canthotomy   Onset Date 06/16/15   Prior Therapy CIR, d/c from hospital 07/14/15   Precautions   Precautions Fall   Precaution Comments --  24 hr. supervision, no driving   Required Braces or Orthoses Other Brace/Splint   Other Brace/Splint R AFO (PLS)   Restrictions   Weight Bearing Restrictions No   Balance Screen   Has the patient fallen in the past 6 months Yes   How many times? 1  in hospital   Home  Environment   Family/patient  expects to be discharged to: Private residence   Available Help at Discharge Family   Lives With Spouse  2 and 6 y.o. children   Prior Function   Level of Independence Independent with basic ADLs;Independent with community mobility without device   Vocation Full time employment   Health visitor   Leisure basketball; cars; [employer] fan   ADL   Eating/Feeding Set up  with LUE    Grooming Teeth care  with LUE, not attempted shaving   Upper Body Bathing Modified independent   Lower Body Bathing Modified independent   Upper Body Dressing Increased time;Needs assist for fasteners   Lower Body Dressing Increased time;Needs assist for fasteners   Toilet Tranfer Modified independent   Toileting - Clothing Manipulation Modified independent   Tub/Shower Transfer Supervision/safety   IADL   Prior Level of Function Light Housekeeping wife performed   Prior Level of Function Meal Prep wife performed   Prior Level of Function Community Mobility independent   Community Mobility Relies on family or friends for transportation  not driving   Medication Management Is responsible for taking medication in correct dosages at correct time   Prior Level of Function Financial Management wife performed   Mobility   Mobility Status History of falls   Mobility Status Comments ambulating with single-point cane, but does not  use consistently as recommended   Written Expression   Dominant Hand Right   Handwriting --  unable with dominant RUE   Vision - History   Baseline Vision No visual deficits   Additional Comments puncture injury to L eye (s/p canthotomy)   Vision Assessment   Visual Fields No apparent deficits  R eye, L eye not assessed   Comment Wife reports bumping into things on R side.  R eye ROM/smooth pursuits WNL, L eye not assessed due to pt unable to open L eyelid.  Pt currently wearing eye patch on L eye.  Pt to follow up with opthalmologist.  Will assess vision further in functional  context prn.   Cognition   Overall Cognitive Status Impaired/Different from baseline  however, wife reports close to baseline   Area of Impairment Memory;Safety/judgement;Awareness;Problem solving   Memory Comments decr delay recall (4/5) per MOCA  needed repetition with directions   Safety/Judgement Decreased awareness of safety  impulsive   Awareness Intellectual   Problem Solving Comments MOCA (Version 7.2) with score of 26/29 with min v.c. for visuospatial/executive section, but also difficulty in language and delayed recall section (however, > or =26/30 is normal).     Behaviors Impulsive  R inattention   Sensation   Additional Comments numbness in R fingertips   Coordination   9 Hole Peg Test Right;Left   Right 9 Hole Peg Test unable    Box and Blocks unable with RUE   Other unable to grasp dylinder object   Perception   Perception Impaired   Inattention/Neglect Does not attend to right side of body  per wife, bumps into items on R side   Edema   Edema none noted   AROM   Overall AROM  Deficits   Overall AROM Comments R shoulder flex 70*, abduction 75*, elbow flex/ext WNL, wrist ext to neutral, supination 75%, trace finger ext, 75% finger flex   PROM   Overall PROM  Within functional limits for tasks performed   Strength   Overall Strength Deficits  see AROM   Hand Function   Right Hand Gross Grasp Impaired   Right Hand Grip (lbs) unable   Comment Not using RUE functionally at this time.  Presents with RUE in sling   RUE Tone   RUE Tone Mild;Modified Ashworth   RUE Tone   Modified Ashworth Scale for Grading Hypertonia RUE Slight increase in muscle tone, manifested by a catch, followed by minimal resistance throughout the remainder (less than half) of the ROM                         OT Education - 07/20/15 1757    Education provided Yes   Education Details Avoid use of sling when sitting, proper positioning of RUE when sitting, only wear sling for  comfort when ambulating to avoid stiffness; Recommended pt take meds as directed   Person(s) Educated Patient;Spouse   Methods Explanation   Comprehension Verbalized understanding          OT Short Term Goals - 07/20/15 1758    OT SHORT TERM GOAL #1   Title Pt will be independent with initial HEP--check STGs 08/16/15   Time 4   Period Days   Status New   OT SHORT TERM GOAL #2   Title Pt will demo at least 80* R shoulder flex with minimal compensation in prep for functional reach.   Baseline 70*   Time 4  Status New   OT SHORT TERM GOAL #3   Title Pt will demo at least 50% gross finger ext for grasp/release of objects.   Baseline trace   Time 4   Period Weeks   Status New   OT SHORT TERM GOAL #4   Title Pt will be able to use RUE as gross assist/stabilizer at least 25% of the time for ADLs.   Time 4   Period Weeks   Status New   OT SHORT TERM GOAL #5   Title Pt will grasp/release small cylinder object with min A in 4/5 trials.   Time 4   Period Weeks   Status New   Additional Short Term Goals   Additional Short Term Goals Yes   OT SHORT TERM GOAL #6   Title Pt will be independent with splint wear/care prn.   Time 4   Period Weeks   Status New           OT Long Term Goals - 07/20/15 1804    OT LONG TERM GOAL #1   Title Pt will be independent with updated HEP.--check LTGs 09/16/15   Time 8   Period Weeks   Status New   OT LONG TERM GOAL #2   Title Pt will demo at least 90* R shoulder flex with minimal compensation in prep for functional reach.   Baseline 70*   Time 8   Period Weeks   Status New   OT LONG TERM GOAL #3   Title Pt will improve RUE functional reaching and coordination as shown by scoring at least 8 on box and blocks test.   Baseline 0   Time 8   Period Weeks   Status New   OT LONG TERM GOAL #4   Title Pt will use RUE as gross assist/stabilizer at least 50% of the time for ADLs.   Time 8   Period Weeks   Status New   OT LONG TERM GOAL #5    Title Pt will demo good safety awareness during functional tasks and per family report.   Time 8   Period Weeks   Status New   Long Term Additional Goals   Additional Long Term Goals Yes   OT LONG TERM GOAL #6   Title Pt will verbalize understanding of cognitive and visual compensation strategies prn.   Time 8   Period Weeks   Status New               Plan - 07/20/15 1747    Clinical Impression Statement Pt is a 29 y.o. male s/p L SAH (06/16/15), L orbital contusion s/p L canthotomy.  Pt currently wearing L eye patach at all times and is to follow up with ophthalmologist.  Pt presents with R hemiparesis with decr dominant RUE functional use, decr strength, spasticity, decr coordination, vision deficits, cognitive deficits, R inattention, decr balance for ADLs.  Pt would benefit from occupational therapy to improve ADLs/IADL performance, RUE functional use, independence, and quality of life.   Pt will benefit from skilled therapeutic intervention in order to improve on the following deficits (Retired) Decreased cognition;Decreased mobility;Decreased strength;Impaired vision/preception;Impaired UE functional use;Pain;Decreased knowledge of use of DME;Decreased balance;Impaired tone;Decreased safety awareness;Decreased coordination;Decreased range of motion;Impaired sensation   Rehab Potential Good   OT Frequency 2x / week   OT Duration 8 weeks  +eval   OT Treatment/Interventions Self-care/ADL training;Cryotherapy;Parrafin;Electrical Stimulation;Fluidtherapy;Moist Heat;Contrast Bath;Passive range of motion;Visual/perceptual remediation/compensation;Cognitive remediation/compensation;DME and/or AE instruction;Therapeutic activities;Therapeutic exercises;Neuromuscular education;Ultrasound;Manual Therapy;Splinting;Patient/family education;Functional  Mobility Training;Therapeutic exercise   Plan initiate RUE HEP   Consulted and Agree with Plan of Care Patient;Family member/caregiver    Family Member Consulted wife        Problem List Patient Active Problem List   Diagnosis Date Noted  . Urinary retention 06/23/2015  . Acute respiratory failure (HCC) 06/23/2015  . Traumatic subarachnoid bleed with LOC of 6 hours to 24 hours (HCC) 06/23/2015  . Right hemiparesis (HCC) 06/23/2015  . Fall 06/22/2015  . Ocular trauma of right eye 06/22/2015  . TBI (traumatic brain injury) (HCC) 06/16/2015    Kaiser Fnd Hosp - FresnoFREEMAN,Dory Verdun 07/20/2015, 6:13 PM  [facility] [address] [place], [place], [address] Phone: [phone]   Fax:  [phone]  Name: Pieter PartridgeJessie Mahnken MRN: [id number] Date of Birth: Jul 28, 1986  Willa Fraterngela Clint Strupp, OTR/L 07/20/2015 6:13 PM

## 2015-07-27 ENCOUNTER — Ambulatory Visit: Payer: 59 | Admitting: Physical Therapy

## 2015-07-29 ENCOUNTER — Ambulatory Visit: Payer: 59 | Admitting: Physical Therapy

## 2015-07-29 DIAGNOSIS — R269 Unspecified abnormalities of gait and mobility: Secondary | ICD-10-CM | POA: Diagnosis not present

## 2015-07-29 DIAGNOSIS — G8191 Hemiplegia, unspecified affecting right dominant side: Secondary | ICD-10-CM

## 2015-07-29 DIAGNOSIS — R252 Cramp and spasm: Secondary | ICD-10-CM

## 2015-07-29 DIAGNOSIS — R2681 Unsteadiness on feet: Secondary | ICD-10-CM

## 2015-07-29 NOTE — Patient Instructions (Signed)
Achilles / Soleus, Standing    Take your brace off for this exercise. Stand, RIGHT foot behind, knee slightly bent (as pictured) heel on floor and turned slightly out. Lower hips and bend knees. Hold _30__ seconds. Do this stretch 4 times per day on the RIGHT leg.  ANKLE: Eversion, Bilateral    Sit at edge of surface, feet on floor. On the RIGHT side, bend your ankle upward and outward at the same time. Slowly return to starting position. Do not move hips or knees (you may need to place your hands on your right knee to prevent it from moving). _30__ reps per set, 2-3 sets per day on the RIGHT side.   Extension: Half Squat   Take your brace off for this exercise. Stand with left hand on stable surface, full length mirror on your RIGHT so you can see your right knee.   Perform a 1/2 squat, making sure your knees don't go over your toes. When you stand up from squat, be sure to control your RIGHT knee to prevent it from snapping into hyperextension. Use the mirror to help you with this.  Perform 20 reps, 3 times per day.

## 2015-07-29 NOTE — Therapy (Signed)
[facility] [address] [place], [place], [address] Phone: [phone]   Fax:  [phone]  Physical Therapy Treatment  Patient Details  Name: Dakota PartridgeJessie [name] MRN: [id number] Date of Birth: 1985/10/30 Referring Provider: Elouise MunroeZarchary Swartz, MD  Encounter Date: 07/29/2015      PT End of Session - 07/29/15 1044    Visit Number 2   Number of Visits 17   Date for PT Re-Evaluation 09/16/15   Authorization Type UHC   PT Start Time 0933   PT Stop Time 1021   PT Time Calculation (min) 48 min   Equipment Utilized During Treatment Gait belt   Activity Tolerance Patient tolerated treatment well   Behavior During Therapy [facility] for tasks assessed/performed      Past Medical History  Diagnosis Date  . Hx of renal calculi     No past surgical history on file.  There were no vitals filed for this visit.  Visit Diagnosis:  Right hemiparesis (HCC)  Abnormality of gait  Unsteadiness  Spasticity      Subjective Assessment - 07/29/15 0936    Subjective Pt arrived accompanied by father today. Denies falls and reports no signifcant changes since PT evaluation. When asked about knee hyperextension, pt reports, "Yeah, it snaps back a lot. in the morning." Pt has not been consistently wearing R AFO for household mobility.   Patient is accompained by: Family member  father, Bernette RedbirdKenny   Pertinent History SAH (06/16/15); L eye contusion, retrobulbar hemorrhage s/p canthotomy   Patient Stated Goals "Just to walk better; walk without the cane or the bracel and to use the arm again."   Currently in Pain? No/denies                         [facility]/Exercise - 07/29/15 0001    Ambulation/Gait   Ambulation/Gait Yes   Ambulation/Gait Assistance 5: Supervision   Ambulation/Gait Assistance Details To assess if R ankle posture contributing to R GR, performed gait x115' with R supination control strap on R AFO.  While using T strap, noted no episodes of R GR with direction changes/distraction, slight improvement in R circumduction. Also, pt noted increased comfort when using T strap.   Ambulation Distance (Feet) 300 Feet   Assistive device None;Other (Comment)  R AFO   Gait Pattern Step-through pattern;Decreased arm swing - right;Decreased stance time - right;Decreased hip/knee flexion - right;Decreased dorsiflexion - right;Decreased weight shift to right;Right circumduction;Right foot flat;Decreased trunk rotation;Wide base of support;Abducted- right;Trendelenburg  R Trendelenburg   Ambulation Surface Level;Indoor   Gait velocity --   Stairs Yes   Stairs Assistance 5: Supervision;4: Min guard   Stairs Assistance Details (indicate cue type and reason) (S) due to intermittent R toe catch (no overt LOB).   Stair Management Technique One rail Left;Step to pattern;Forwards   Number of Stairs 4   Height of Stairs 6   Pre-Gait Activities Standing with LUE support and mirror anterior/R to pt for visual feedback, performed pregait activities focused on ankle DF/eversion during RLE advancement (tactile cueing at R peroneals); during R stance phase, tactile cueing for upright posture (to prevent L hip retraction) and at distal R hamstrings to control R genu recurvatum.  Performed without AFO   Gait Comments During turning, noted minimal lateral weight shift to RLE; when PT provided manual facilitation of weight shifting, noted R genu recurvatum during turning.   Standardized Balance Assessment   Standardized Balance Assessment  Dynamic Gait Index   Dynamic Gait Index   Level Surface Mild Impairment   Change in Gait Speed Mild Impairment   Gait with Horizontal Head Turns Moderate Impairment   Gait with Vertical Head Turns Moderate Impairment   Gait and Pivot Turn Mild Impairment   Step Over Obstacle Mild Impairment   Step Around Obstacles Mild Impairment   Steps Moderate Impairment   Total Score 13   Neuro  Re-ed    Neuro Re-ed Details  Standing with BUE support with R knee flexed slightly (to isolate R soleus), performed self-stretch of R ankle plantarflexors/inverters to manage extensor tone during RLE advancement. Seated active R ankle DF with concurrent eversion to improve RLE clearance without AFO; cueing focused on pt looking at R ankle during NMR; pt able to perform through full ROM whennot distracted but reports "it's really hard". Standing with LUE support at chair and mirror anterior/to R of pt for visual feedback, pt performed 1/2 squats x17 reps with multimodal cueing for visual attention to mirror, tactile cueing for controlled R TKE to prevent R GR.  See Pt Instructions for details on reps, sets                PT Education - 07/29/15 1028    Education provided Yes   Education Details DGI findings, fall risk. Recommending pt use AFO in home to prevent R knee hyperextension. HEP initiated.   Person(s) Educated Patient;Parent(s)   Methods Explanation;Demonstration;Tactile cues;Verbal cues;Handout   Comprehension Verbalized understanding;Verbal cues required;Tactile cues required;Returned demonstration          PT Short Term Goals - 07/29/15 1049    PT SHORT TERM GOAL #1   Title Pt will perform initial HEP with mod I using paper handout to maximize functional gains made in PT. Target date: 08/15/15   PT SHORT TERM GOAL #2   Title Pt will negotiate 9 stairs with single rail and mod I (no cueing for safety awareness) to indicate safety using primary home entrance. Target date: 08/15/15   PT SHORT TERM GOAL #3   Title Pt will increase gait velocity from 1.97 ft/sec to 2.27 ft/sec to indicate increase efficiency of ambulation. Target date: 08/15/15   PT SHORT TERM GOAL #4   Title Pt will independently ambulate 200' without AD using AFO to progress toward PLOF and pt-stated goal of independent ambulation. Target date: 08/15/15   PT SHORT TERM GOAL #5   Title Pt will ambulate 500'  over unlevel, paved surfaces with mod I using LRAD to indicate increased safety with limited community mobility. Target date: 08/15/15   PT SHORT TERM GOAL #6   Title Complete DGI to assess dynamic gait stability. Target date: 08/15/15   Baseline 11/18: DGI score = 13/24   Status Achieved   PT SHORT TERM GOAL #7   Title                PT Long Term Goals - 07/18/15 1838    PT LONG TERM GOAL #1   Title Pt will increase gait velocity from 1.97 ft/sec to > 2.62 ft/sec to indicate status of community ambulator. Target date: 09/12/15   PT LONG TERM GOAL #2   Title Pt will score >/= 20/24 on DGI to indicate decreased risk of falling. Target date: 09/12/15   PT LONG TERM GOAL #3   Title Pt will independently negotiate 9 stairs with single rail and reciprocal pattern to indicate pt independence using primary home entrance. Target date: 09/12/15  PT LONG TERM GOAL #4   Title Pt will independently ambulate 200' over level, indoor surfaces without assistive device or AFO to progress toward PLOF as independent with household ambulation. Target date: 09/12/15   PT LONG TERM GOAL #5   Title Pt will ambulate >1,000' over outdoor, paved surfaces with mod I using LRAD to indicate increased independence with commnuity mobility. Target date: 09/12/15   Additional Long Term Goals   Additional Long Term Goals Yes   PT LONG TERM GOAL #6   Title Pt will negotiate standard ramp and curb step with mod I using LRAD to indicate safety traversing community obstacles. Target date: 09/12/15               Plan - 07/29/15 1045    Clinical Impression Statement Skilled session focused on assessing dynamic gait stability and initiating HEP.  DGI score = 13/24, suggesting increased risk of falling. HEP focused on increasing selective control of R ankle DF/eversion and increasing control of R TKE to improve gait stability without AFO.    Pt will benefit from skilled therapeutic intervention in order to improve on the following  deficits Abnormal gait;Decreased cognition;Decreased balance;Decreased strength;Decreased safety awareness;Impaired vision/preception;Impaired UE functional use;Impaired tone   Rehab Potential Good   Clinical Impairments Affecting Rehab Potential decreased safety awareness; impaired vision in L eye   PT Frequency 2x / week   PT Duration 8 weeks   PT Treatment/Interventions ADLs/Self Care Home Management;Electrical Stimulation;DME Instruction;Gait training;Stair training;Functional mobility training;Therapeutic activities;Therapeutic exercise;Balance training;Neuromuscular re-education;Vestibular;Visual/perceptual remediation/compensation   PT Next Visit Plan If pt safe/compliant with intiial HEP, expand on HEP.   PT Home Exercise Plan Initiated 11/18. Consider adding exercise for R gluteus medius strengthening.   Consulted and Agree with Plan of Care Patient   Family Member Consulted dad, Bernette Redbird        Problem List Patient Active Problem List   Diagnosis Date Noted  . Urinary retention 06/23/2015  . Acute respiratory failure (HCC) 06/23/2015  . Traumatic subarachnoid bleed with LOC of 6 hours to 24 hours (HCC) 06/23/2015  . Right hemiparesis (HCC) 06/23/2015  . Fall 06/22/2015  . Ocular trauma of right eye 06/22/2015  . TBI (traumatic brain injury) (HCC) 06/16/2015   Jorje Guild, PT, DPT [facility] [address] [place], [place], [address] Phone: [phone]   Fax:  [phone] 07/29/2015, 10:52 AM   Name: Kalen Ratajczak MRN: [id number] Date of Birth: [birth year]

## 2015-08-01 ENCOUNTER — Encounter: Payer: Self-pay | Admitting: Occupational Therapy

## 2015-08-01 ENCOUNTER — Ambulatory Visit: Payer: 59 | Admitting: Occupational Therapy

## 2015-08-01 DIAGNOSIS — R41840 Attention and concentration deficit: Secondary | ICD-10-CM

## 2015-08-01 DIAGNOSIS — R252 Cramp and spasm: Secondary | ICD-10-CM

## 2015-08-01 DIAGNOSIS — R279 Unspecified lack of coordination: Secondary | ICD-10-CM

## 2015-08-01 DIAGNOSIS — G8191 Hemiplegia, unspecified affecting right dominant side: Secondary | ICD-10-CM

## 2015-08-01 DIAGNOSIS — R4189 Other symptoms and signs involving cognitive functions and awareness: Secondary | ICD-10-CM

## 2015-08-01 DIAGNOSIS — R269 Unspecified abnormalities of gait and mobility: Secondary | ICD-10-CM | POA: Diagnosis not present

## 2015-08-01 NOTE — Therapy (Signed)
[facility] [address] [place], [place], [address] Phone: [phone]   Fax:  [phone]  Occupational Therapy Treatment  Patient Details  Name: Dakota [name] MRN: [id number] Date of Birth: Jul 20, 1986 No Data Recorded  Encounter Date: 08/01/2015      OT End of Session - 08/01/15 1654    Visit Number 2   Number of Visits 17   Date for OT Re-Evaluation 09/16/15   Authorization Type UHC, awaiting verification of benefits   OT Start Time 1545   OT Stop Time 1628   OT Time Calculation (min) 43 min   Activity Tolerance Patient tolerated treatment well   Behavior During Therapy Restless;Impulsive      Past Medical History  Diagnosis Date  . Hx of renal calculi     History reviewed. No pertinent past surgical history.  There were no vitals filed for this visit.  Visit Diagnosis:  Right hemiparesis (HCC)  Spasticity  Lack of coordination  Inattention  Cognitive deficits      Subjective Assessment - 08/01/15 1633    Subjective  "I can't open"   Patient is accompained by: Family member  wife   Pertinent History Pt hospitalized on 06/16/15 for TBI with SAH, L eye injury (retrobulbar hemorrhage). Transferred to inpatient rehab on 10/13, where he remained until discharged home on 07/14/15.   Patient Stated Goals use RUE again   Currently in Pain? No/denies                      OT Treatments/Exercises (OP) - 08/01/15 0001    Neurological Re-education Exercises   Shoulder Flexion AAROM;Supine;Both  with ball with mod facilitation/cues   Elbow Extension AAROM;Right;Seated  and shoulder flex with UE ranger with min facilitation   Other Exercises 1 Focus on normal movement patterns for functional movements such as release of object, lifting hand to and from lap without compensation, and proper positioning when reaching with arm with mod-max facilitation/cues.                OT  Education - 08/01/15 1633    Education Details HEP--see pt instructions; avoiding compensation strategies; how to facilitate opening hand by relaxing; avoiding hyperextension of MPs; using slow controlled movements; avoid repetive flexion; importance of RUE awareness and proper positioning with movement for safety/prevent pain; avoid weighted/resistive activities that pt has been doing on his own to prevent pain (and instructed pt/wife on why inappropriate)   Person(s) Educated Patient;Spouse   Methods Explanation;Demonstration;Verbal cues;Tactile cues;Handout   Comprehension Verbalized understanding;Returned demonstration;Verbal cues required          OT Short Term Goals - 07/20/15 1758    OT SHORT TERM GOAL #1   Title Pt will be independent with initial HEP--check STGs 08/16/15   Time 4   Period Days   Status New   OT SHORT TERM GOAL #2   Title Pt will demo at least 80* R shoulder flex with minimal compensation in prep for functional reach.   Baseline 70*   Time 4   Status New   OT SHORT TERM GOAL #3   Title Pt will demo at least 50% gross finger ext for grasp/release of objects.   Baseline trace   Time 4   Period Weeks   Status New   OT SHORT TERM GOAL #4   Title Pt will be able to use RUE as gross assist/stabilizer at least 25% of the time for ADLs.   Time  4   Period Weeks   Status New   OT SHORT TERM GOAL #5   Title Pt will grasp/release small cylinder object with min A in 4/5 trials.   Time 4   Period Weeks   Status New   Additional Short Term Goals   Additional Short Term Goals Yes   OT SHORT TERM GOAL #6   Title Pt will be independent with splint wear/care prn.   Time 4   Period Weeks   Status New           OT Long Term Goals - 07/20/15 1804    OT LONG TERM GOAL #1   Title Pt will be independent with updated HEP.--check LTGs 09/16/15   Time 8   Period Weeks   Status New   OT LONG TERM GOAL #2   Title Pt will demo at least 90* R shoulder flex with minimal  compensation in prep for functional reach.   Baseline 70*   Time 8   Period Weeks   Status New   OT LONG TERM GOAL #3   Title Pt will improve RUE functional reaching and coordination as shown by scoring at least 8 on box and blocks test.   Baseline 0   Time 8   Period Weeks   Status New   OT LONG TERM GOAL #4   Title Pt will use RUE as gross assist/stabilizer at least 50% of the time for ADLs.   Time 8   Period Weeks   Status New   OT LONG TERM GOAL #5   Title Pt will demo good safety awareness during functional tasks and per family report.   Time 8   Period Weeks   Status New   Long Term Additional Goals   Additional Long Term Goals Yes   OT LONG TERM GOAL #6   Title Pt will verbalize understanding of cognitive and visual compensation strategies prn.   Time 8   Period Weeks   Status New               Plan - 08/01/15 1655    Clinical Impression Statement Pt needs significant cueing for normal movement patterns with attempts to use RUE and needs cues for RUE awareness/safety.   Plan ?resting hand splint RUE; review HEP   Consulted and Agree with Plan of Care Patient;Family member/caregiver   Family Member Consulted wife        Problem List Patient Active Problem List   Diagnosis Date Noted  . Urinary retention 06/23/2015  . Acute respiratory failure (HCC) 06/23/2015  . Traumatic subarachnoid bleed with LOC of 6 hours to 24 hours (HCC) 06/23/2015  . Right hemiparesis (HCC) 06/23/2015  . Fall 06/22/2015  . Ocular trauma of right eye 06/22/2015  . TBI (traumatic brain injury) [facility](HCC) 06/16/2015    [facility]REEMAN,ANGELA 08/01/2015, 4:57 PM  [facility] [address] [place], [place], [address] Phone: [phone]   Fax:  [phone]  Name: Dakota [name] MRN: [id number] Date of Birth: Jul 27, 1986  Willa Fraterngela Freeman, OTR/L 08/01/2015 4:57 PM

## 2015-08-01 NOTE — Patient Instructions (Signed)
   SELF ASSISTED WITH OBJECT: Shoulder Flexion - Sitting    Hold cane with both hands. Raise arms up to shoulder height with elbows straight. 10 reps per set, 2 days per day.  Copyright  VHI. All rights reserved.  ELBOW: Extension - Supine    Rest arm on chest. Straighten elbow. Use left arm to support right arm by holding under elbow where right thumb is facing up. 10 reps per set, 2 days per day.  Copyright  VHI. All rights reserved.    **Stand with both hands on table.  Make sure your arm is in good alignment.  Keep elbow straight.  Slowly lean forward on both hands, hold 10sec.  Then shift your weight back.     Repeat 5 times.  2 times per day. Flexion (Assistive)    Lying down.  Clasp hands together and raise arms above head, keeping elbows as straight as possible.  Thumb facing up. Repeat 10 times. Do 2 sessions per day.  Copyright  VHI. All rights reserved.

## 2015-08-03 ENCOUNTER — Ambulatory Visit: Payer: 59 | Admitting: Physical Therapy

## 2015-08-03 DIAGNOSIS — R279 Unspecified lack of coordination: Secondary | ICD-10-CM

## 2015-08-03 DIAGNOSIS — R269 Unspecified abnormalities of gait and mobility: Secondary | ICD-10-CM | POA: Diagnosis not present

## 2015-08-03 DIAGNOSIS — R252 Cramp and spasm: Secondary | ICD-10-CM

## 2015-08-03 DIAGNOSIS — G8191 Hemiplegia, unspecified affecting right dominant side: Secondary | ICD-10-CM

## 2015-08-03 NOTE — Patient Instructions (Addendum)
Achilles / Soleus, Standing    Take your brace off for this exercise. Stand, RIGHT foot behind, knee slightly bent (as pictured) heel on floor and turned slightly out. Lower hips and bend knees. Hold _30__ seconds. Do this stretch 4 times per day on the RIGHT leg.  ANKLE: Eversion, Bilateral    Sit at edge of surface, feet on floor. On the RIGHT side, bend your ankle upward and outward at the same time. Slowly return to starting position. Do not move hips or knees (you may need to place your hands on your right knee to prevent it from moving). _15__ reps per set, 2-3 sets per day on the RIGHT side.   Extension: Half Squat  Deep Squat     Take your brace off for this exercise. Stand with left hand on stable surface, full length mirror on your RIGHT so you can see your right knee.   Perform a 1/2 squat, making sure your knees don't go over your toes. When you stand up from squat, be sure to control your RIGHT knee to prevent it from snapping into hyperextension. Use the mirror to help you with this.  Perform 20 reps, 3 times per day.   Abduction: Clam (Eccentric) - Side-Lying    Lie on LEFT side with knees bent. Lift top knee, keeping feet together. Keep trunk steady. (Don't let top hip roll backwards). Slowly lower for 3-5 seconds.  You should feel this in your "back pocket" muscle. Perform 8 reps, 3 times per day.

## 2015-08-04 NOTE — Therapy (Signed)
[facility] [address] [place], [place], [address] Phone: [phone]   Fax:  [phone]  Physical Therapy Treatment  Patient Details  Name: Dakota [name] MRN: [id number] Date of Birth: Nov 18, 1985 Referring Provider: Dwana Melena, MD  Encounter Date: 08/03/2015      PT End of Session - 08/04/15 1043    Visit Number 3   Number of Visits 17   Date for PT Re-Evaluation 09/16/15   Authorization Type UHC   PT Start Time 1448   PT Stop Time 1530   PT Time Calculation (min) 42 min   Activity Tolerance Patient tolerated treatment well   Behavior During Therapy [facility] for tasks assessed/performed      Past Medical History  Diagnosis Date  . Hx of renal calculi     No past surgical history on file.  There were no vitals filed for this visit.  Visit Diagnosis:  Abnormality of gait  Right hemiparesis ([place])  Spasticity  Lack of coordination      Subjective Assessment - 08/03/15 1450    Subjective Pt arrived accompanied by wife and 2 kids. Pt not wearing L eye patch today, stating, "sometimes it just bothers me, so I took it off."   Patient is accompained by: Family member  wife, Benjie Karvonen, and 2 daughters   Pertinent History SAH (06/16/15); L eye contusion, retrobulbar hemorrhage s/p canthotomy   Patient Stated Goals "Just to walk better; walk without the cane or the bracel and to use the arm again."   Currently in Pain? No/denies                         [facility] Adult PT Treatment/Exercise - 08/04/15 0001    Ambulation/Gait   Ambulation/Gait Yes   Ambulation/Gait Assistance 6: Modified independent (Device/Increase time);5: Supervision   Ambulation/Gait Assistance Details 2 x135' with SPC and R AFO;. x230' without AD and with no AFO with verbal/tactile cueing for control of R genu recurvatum during R mid-terminal stance. See below for detailed description of gait training on treadmill.   Ambulation Distance (Feet) 500 Feet   Assistive device Straight cane;None;Other (Comment)  R AFO (PLS) and heel wedge   Gait Pattern Step-through pattern;Decreased arm swing - right;Decreased stance time - right;Decreased hip/knee flexion - right;Decreased dorsiflexion - right;Decreased weight shift to right;Right circumduction;Right foot flat;Wide base of support;Abducted- right;Trendelenburg;Right genu recurvatum  gait pattern without AD, AFO   Ambulation Surface Level;Indoor   Stairs Yes   Stairs Assistance 5: Supervision   Stairs Assistance Details (indicate cue type and reason) Improved awareness of RLE positioning on step (as compared with previous sessions), improved safety awareness. Noted R recurvatum x2 episodes.   Stair Management Technique One rail Right;Step to pattern;Forwards;Other (comment)  no R AFO, no heel wedge   Number of Stairs 8   Height of Stairs 6   Gait Comments Gait training over treadmill x13 minutes total with mirror at pt's R side for visual feedback; speed increased from .2 - .5.  For initial 5 minutes, used green Tband at RLE (anterior ankle, posterior knee, anterior hip) for proprioceptive input. Multimodal cueing focused on R hip/knee flexion during RLE advancement (to address circumduction); tactile cueing at R anterior tib, R peroneals to promote consistent RLE clearance. During stance phase, pt initially required tactile cueing at distal R hamstrings, R gluteus medius to address R GR. During final 2 minutes of trial, pt consistently controlling R GR and clearing RLE without  circumduction or R toe catch with no cueing required.   Exercises   Exercises Other Exercises   Other Exercises  Reviewed HEP provided during previous PT session. Using paper handout, pt effective performed exercises. Decreased reps for R ankle DF/eversion due to pt fatigue. Added R clamshell exercise; pt required cueing for proper alignment/technique.                PT Education -  08/04/15 1024    Education provided Yes   Education Details HEP updated: see Pt Instructions. Recommending short-distance household gait without AFO unless pt/wife notice R knee hyperextension.   Person(s) Educated Patient;Spouse   Methods Explanation;Demonstration;Handout   Comprehension Verbalized understanding;Returned demonstration          PT Short Term Goals - 08/04/15 1039    PT SHORT TERM GOAL #1   Title Pt will perform initial HEP with mod I using paper handout to maximize functional gains made in PT. Target date: 08/15/15   Baseline Met 11/23.   Status Achieved   PT SHORT TERM GOAL #2   Title Pt will negotiate 9 stairs with single rail and mod I (no cueing for safety awareness) to indicate safety using primary home entrance. Target date: 08/15/15   PT SHORT TERM GOAL #3   Title Pt will increase gait velocity from 1.97 ft/sec to 2.27 ft/sec to indicate increase efficiency of ambulation. Target date: 08/15/15   PT SHORT TERM GOAL #4   Title Pt will independently ambulate 200' without AD using AFO to progress toward PLOF and pt-stated goal of independent ambulation. Target date: 08/15/15   PT SHORT TERM GOAL #5   Title Pt will ambulate 500' over unlevel, paved surfaces with mod I using LRAD to indicate increased safety with limited community mobility. Target date: 08/15/15   PT SHORT TERM GOAL #6   Title Complete DGI to assess dynamic gait stability. Target date: 08/15/15   Baseline 11/18: DGI score = 13/24   Status Achieved   PT SHORT TERM GOAL #7   Title                PT Long Term Goals - 07/18/15 1838    PT LONG TERM GOAL #1   Title Pt will increase gait velocity from 1.97 ft/sec to > 2.62 ft/sec to indicate status of community ambulator. Target date: 09/12/15   PT LONG TERM GOAL #2   Title Pt will score >/= 20/24 on DGI to indicate decreased risk of falling. Target date: 09/12/15   PT LONG TERM GOAL #3   Title Pt will independently negotiate 9 stairs with single rail and  reciprocal pattern to indicate pt independence using primary home entrance. Target date: 09/12/15   PT LONG TERM GOAL #4   Title Pt will independently ambulate 200' over level, indoor surfaces without assistive device or AFO to progress toward PLOF as independent with household ambulation. Target date: 09/12/15   PT LONG TERM GOAL #5   Title Pt will ambulate >1,000' over outdoor, paved surfaces with mod I using LRAD to indicate increased independence with commnuity mobility. Target date: 09/12/15   Additional Long Term Goals   Additional Long Term Goals Yes   PT LONG TERM GOAL #6   Title Pt will negotiate standard ramp and curb step with mod I using LRAD to indicate safety traversing community obstacles. Target date: 09/12/15               Plan - 08/04/15 1044    Clinical  Impression Statement Session focused on gait training with emphasis on increasing R ankle DF and controlling R genu recurvatum without use of AFO. STG met for HEP performance/compliance.   Pt will benefit from skilled therapeutic intervention in order to improve on the following deficits Abnormal gait;Decreased cognition;Decreased balance;Decreased strength;Decreased safety awareness;Impaired vision/preception;Impaired UE functional use;Impaired tone   Rehab Potential Good   Clinical Impairments Affecting Rehab Potential decreased safety awareness; impaired vision in L eye   PT Frequency 2x / week   PT Duration 8 weeks   PT Treatment/Interventions ADLs/Self Care Home Management;Electrical Stimulation;DME Instruction;Gait training;Stair training;Functional mobility training;Therapeutic activities;Therapeutic exercise;Balance training;Neuromuscular re-education;Vestibular;Visual/perceptual remediation/compensation   PT Next Visit Plan Continue gait training without AFO, controlling recurvatum without crouched gait pattern.   PT Home Exercise Plan see pt instructions from 11/23   Consulted and Agree with Plan of Care  Patient;Family member/caregiver   Family Member Consulted wife, Benjie Karvonen        Problem List Patient Active Problem List   Diagnosis Date Noted  . Urinary retention 06/23/2015  . Acute respiratory failure ([place]) 06/23/2015  . Traumatic subarachnoid bleed with LOC of 6 hours to 24 hours ([place]) 06/23/2015  . Right hemiparesis ([place]) 06/23/2015  . Fall 06/22/2015  . Ocular trauma of right eye 06/22/2015  . TBI (traumatic brain injury) ([place]) 06/16/2015    Billie Ruddy, PT, [facility] [address] [place], [place], [address] Phone: [phone]   Fax:  [phone] 08/04/2015, 10:57 AM   Name: Arshia Rondon MRN: [id number] Date of Birth: August 24, 1986

## 2015-08-09 ENCOUNTER — Encounter: Payer: 59 | Attending: Physical Medicine & Rehabilitation | Admitting: Physical Medicine & Rehabilitation

## 2015-08-09 ENCOUNTER — Encounter: Payer: Self-pay | Admitting: Physical Medicine & Rehabilitation

## 2015-08-09 VITALS — BP 110/57 | HR 74 | Resp 14

## 2015-08-09 DIAGNOSIS — S066X9A Traumatic subarachnoid hemorrhage with loss of consciousness of unspecified duration, initial encounter: Secondary | ICD-10-CM | POA: Insufficient documentation

## 2015-08-09 DIAGNOSIS — F329 Major depressive disorder, single episode, unspecified: Secondary | ICD-10-CM | POA: Insufficient documentation

## 2015-08-09 DIAGNOSIS — S0591XS Unspecified injury of right eye and orbit, sequela: Secondary | ICD-10-CM

## 2015-08-09 DIAGNOSIS — X58XXXA Exposure to other specified factors, initial encounter: Secondary | ICD-10-CM | POA: Insufficient documentation

## 2015-08-09 DIAGNOSIS — S066X4S Traumatic subarachnoid hemorrhage with loss of consciousness of 6 hours to 24 hours, sequela: Secondary | ICD-10-CM

## 2015-08-09 DIAGNOSIS — Z87891 Personal history of nicotine dependence: Secondary | ICD-10-CM | POA: Insufficient documentation

## 2015-08-09 DIAGNOSIS — G8110 Spastic hemiplegia affecting unspecified side: Secondary | ICD-10-CM | POA: Diagnosis not present

## 2015-08-09 DIAGNOSIS — Z87442 Personal history of urinary calculi: Secondary | ICD-10-CM | POA: Insufficient documentation

## 2015-08-09 MED ORDER — CITALOPRAM HYDROBROMIDE 10 MG PO TABS: 10.0000 mg | ORAL_TABLET | Freq: Every day | ORAL | Status: DC

## 2015-08-09 NOTE — Progress Notes (Signed)
Subjective:    Patient ID: Dakota [name], male    DOB: 01/06/1986, 29 y.o.   MRN: [id number]  HPI  Dakota [name] is here in follow up of his TBI. He has been home almost a month. He's working in outpt therapies. He has not seen optho yet regarding his left eye. His lid still lags and he sees double through the left eye.   His pain levels are good. His sleep has been poor---he just can't fall asleep. He tries to go to bed around 0200 or 0300 in the morning. His appetite is uncontrolled. He's at home with mother-n-law during the day while his wife works. I asked him if he felt depressed, and he stated he wasn't sure how he felt. He has lacked drive to pursue any routine or activities in or outside the home.  From a strength standpoint, he's slowly improving. The right arm continues to lag behind the leg. He is still taking his baclofen for spasticity. He's more tight in the morning than any other time.    Pain Inventory Average Pain 0 Pain Right Now 0 My pain is na  In the last 24 hours, has pain interfered with the following? General activity 0 Relation with others 0 Enjoyment of life 0 What TIME of day is your pain at its worst? na Sleep (in general) Fair  Pain is worse with: na Pain improves with: na Relief from Meds: na  Mobility use a cane how many minutes can you walk? 10 ability to climb steps?  yes do you drive?  no  Function employed # of hrs/week 40  Neuro/Psych trouble walking  Prior Studies Any changes since last visit?  no  Physicians involved in your care Any changes since last visit?  no   Family History  Problem Relation Age of Onset  . Healthy Mother   . Healthy Father   . Cancer Maternal Grandmother   . Healthy Maternal Grandfather   . Heart disease Paternal Grandfather    Social History   Social History  . Marital Status: Married    Spouse Name: N/A  . Number of Children: N/A  . Years of Education: N/A   Social History Main Topics  . Smoking  status: Former Smoker -- 0.50 packs/day    Types: Cigarettes  . Smokeless tobacco: Former Neurosurgeon    Quit date: 06/16/2015  . Alcohol Use: No  . Drug Use: No  . Sexual Activity: Not Asked   Other Topics Concern  . None   Social History Narrative   History reviewed. No pertinent past surgical history. Past Medical History  Diagnosis Date  . Hx of renal calculi    BP 110/57 mmHg  Pulse 74  Resp 14  SpO2 98%  Opioid Risk Score:   Fall Risk Score:  `1  Depression screen PHQ 2/9  Depression screen PHQ 2/9 08/09/2015  Decreased Interest 0  Down, Depressed, Hopeless 0  PHQ - 2 Score 0     Review of Systems  Musculoskeletal: Positive for gait problem.  All other systems reviewed and are negative.      Objective:   Physical Exam  HENT:  Head: Normocephalic. Left eye and face trauma Eyes: Right eye exhibits no discharge.  Left ptosis. .  Neck: Normal range of motion. Neck supple.  Cardiovascular: Normal rate and regular rhythm.  No murmur heard. Respiratory: Effort normal and breath sounds normal. No respiratory distress. He has no wheezes.  GI: Soft. Bowel sounds are normal. He  exhibits no distension. There is no tenderness. No rebound Musculoskeletal: He exhibits tenderness. Minimal edema in LUE.  Neurological: He is alert and oriented. able to open lid to about 40% currently on left.. Left pupil dilated but does constrict slightly to light. Tracks almost completely in medial/lateral planes.  Right facial weakness with mild to moderate dysarthria and right tongue deviation. RUE: 2/5 shoulder,   2- biceps, triceps, 1-2 finger and wrist flexion, 1 extension/HI. RLE: grossly 4+/5 HF,4/5 KE, 3/5 ADF/APF. Extensor tone seems minimal today RLE Tight heel cord still 5/5 on the left upper and left lower extremity. Gait mechanics are fairly good. He utilizes excessive hip swing in advancing the right leg but overall it's mild. Sensation intact to light touch.   Psychiatric: His affect is blunt. Fair insight and awareness.       Assessment & Plan:  1. Functional deficits secondary to TBI with SAH --resultant spastic right hemiparesis  -continues to make progress  -stay with outpatient therapies  -recommend sling use when up exercising.  2. ?Reactive depression/imsomnia: will initiate low dose celexa 10mg  qhs  3. Left retrobulbar hemorrhage with RAPD: had had substantial natural recovery  -recommend optho follow up within the next month to determine plan. 4. Spasticity: tone in RLE improved with AFO and increase mobility, stretching with therapies and on his own- -continue baclofen for extensor tone in RLE-   -splinting/ROM   -consider botox to right wrist/finger flexors.   Thirty minutes of face to face patient care time were spent during this visit. All questions were encouraged and answered. Follow up in two months.

## 2015-08-09 NOTE — Patient Instructions (Signed)
CONTINUE TO WORK ON ESTABLISHING BETTER SLEEP HABITS.  FIND ACTIVITIES AND A ROUTINE TO PURSUE DURING THE DAY.  IF YOU ARE STILL STRUGGLING WITH SLEEP AFTER THE NEXT 2-3 WEEKS, PLEASE CALL ME   PLEASE CALL ME WITH ANY PROBLEMS OR QUESTIONS [phone]). HAVE A HAPPY HOLIDAY SEASON!!!

## 2015-08-10 ENCOUNTER — Ambulatory Visit: Payer: 59 | Admitting: Occupational Therapy

## 2015-08-10 ENCOUNTER — Ambulatory Visit: Payer: 59 | Admitting: Physical Therapy

## 2015-08-10 ENCOUNTER — Encounter: Payer: Self-pay | Admitting: Physical Therapy

## 2015-08-10 DIAGNOSIS — R4189 Other symptoms and signs involving cognitive functions and awareness: Secondary | ICD-10-CM

## 2015-08-10 DIAGNOSIS — G8191 Hemiplegia, unspecified affecting right dominant side: Secondary | ICD-10-CM

## 2015-08-10 DIAGNOSIS — R2681 Unsteadiness on feet: Secondary | ICD-10-CM

## 2015-08-10 DIAGNOSIS — R279 Unspecified lack of coordination: Secondary | ICD-10-CM

## 2015-08-10 DIAGNOSIS — R269 Unspecified abnormalities of gait and mobility: Secondary | ICD-10-CM | POA: Diagnosis not present

## 2015-08-10 DIAGNOSIS — R252 Cramp and spasm: Secondary | ICD-10-CM

## 2015-08-10 DIAGNOSIS — R41840 Attention and concentration deficit: Secondary | ICD-10-CM

## 2015-08-10 NOTE — Therapy (Signed)
[facility] [address] [place], [place], [address] Phone: [phone]   Fax:  [phone]  Physical Therapy Treatment  Patient Details  Name: Dakota [name] MRN: [id number] Date of Birth: April 03, 1986 Referring Provider: Dwana Melena, MD  Encounter Date: 08/10/2015      PT End of Session - 08/10/15 1550    Visit Number 4   Number of Visits 17   Date for PT Re-Evaluation 09/16/15   Authorization Type UHC   PT Start Time 6962   PT Stop Time 1530   PT Time Calculation (min) 45 min   Equipment Utilized During Treatment Gait belt   Activity Tolerance Patient tolerated treatment well   Behavior During Therapy [facility] for tasks assessed/performed      Past Medical History  Diagnosis Date  . Hx of renal calculi     History reviewed. No pertinent past surgical history.  There were no vitals filed for this visit.  Visit Diagnosis:  Abnormality of gait  Right hemiparesis ([place])  Lack of coordination  Unsteadiness      Subjective Assessment - 08/10/15 1505    Subjective Pt reports no changes in status except for new RX from doctor for mood and increased sleep quality ( RX is on file). Pt reports that he is not going to take the medicine.   Patient is accompained by: Family member  wife, Benjie Karvonen, and 2 daughters   Pertinent History SAH (06/16/15); L eye contusion, retrobulbar hemorrhage s/p canthotomy   Patient Stated Goals "Just to walk better; walk without the cane or the bracel and to use the arm again."   Currently in Pain? No/denies     Gait training: To normalize improper gait due to R hemiparesis by using verbal, tactile and visual cues for feedback. Pt supervision to min guard assist with exercises due to balance challenges and instability.  1. Pt completed 271f of gait with R AFO and with single point cane. Pt min guard assist for safety. Pt presented without R foot slap, circumduction or genu recurvatum.  This was the only activity completed this session with R AFO.  2. Pt completed 11109flap X1 along with 8035f4 along long side of track using blue to white line in floor and mirror for visual feedback to correct circumduction on R. Pt required min guard assist due to instability. Pt required verbal, tactile and visual cues for R foot slap, genu recurvatum, circumduction and foot whip into internal rotation.   NMR: To facilitate the neuromuscular connection between the R LE and brain by focusing on vestibular system and proprioception by challenging pt on level and unlevel surfaces in single leg stance while giving verbal and tactile cues to stabilize R knee in neutral position to decrease genu recurvatum.  1. Sit to stands from high/low mat: 2 sets, 1 set consisted of 20 reps with L LE on 4in block and no use of UE's and the second set consisted of 15 reps with L LE on 6in block. Verbal and tactile cues (Green Theraband around knee with resistance given to posterior knee). Verbal cues to control speed, stabilize R knee, and to control genu recurvatum and the initial unlocking of knee joint.  2. Step over half foam: Pt completed 1 set on each side X20 reps. Verbal cues to control speed, stabilize R knee, and to control genu recurvatum and the initial unlocking of knee joint.  3. Single leg stance toe taps on first 3 stairs up and down  with and without use of handrail on L. Pt completed 1 set each leg of 20 reps up and down. Pt supervision. Verbal cues to control speed, stabilize R knee, and to control genu recurvatum and the initial unlocking of knee joint.        PT Education - 08/10/15 1542    Education provided Yes   Education Details Pt educated on benefits of dorsiflexion exercise with Theraband and B single leg stance to increase strength of B LE's and to reduce gait abnormalities (see gait training), especially on R LE. Pt taught bracing of quads and hams to stabilize R knee to decrease genu  recurvatum.    Person(s) Educated Patient   Methods Explanation;Demonstration   Comprehension Verbalized understanding;Returned demonstration          PT Short Term Goals - 08/10/15 1556    PT SHORT TERM GOAL #1   Title Pt will perform initial HEP with mod I using paper handout to maximize functional gains made in PT. Target date: 08/15/15   Baseline Met 11/23.   Status Achieved   PT SHORT TERM GOAL #2   Title Pt will negotiate 9 stairs with single rail and mod I (no cueing for safety awareness) to indicate safety using primary home entrance. Target date: 08/15/15   PT SHORT TERM GOAL #3   Title Pt will increase gait velocity from 1.97 ft/sec to 2.27 ft/sec to indicate increase efficiency of ambulation. Target date: 08/15/15   PT SHORT TERM GOAL #4   Title Pt will independently ambulate 200' without AD using AFO to progress toward PLOF and pt-stated goal of independent ambulation. Target date: 08/15/15   PT SHORT TERM GOAL #5   Title Pt will ambulate 500' over unlevel, paved surfaces with mod I using LRAD to indicate increased safety with limited community mobility. Target date: 08/15/15   PT SHORT TERM GOAL #6   Title Complete DGI to assess dynamic gait stability. Target date: 08/15/15   Baseline 11/18: DGI score = 13/24   Status Achieved   PT SHORT TERM GOAL #7   Title                PT Long Term Goals - 08/10/15 1557    PT LONG TERM GOAL #1   Title Pt will increase gait velocity from 1.97 ft/sec to > 2.62 ft/sec to indicate status of community ambulator. Target date: 09/12/15   PT LONG TERM GOAL #2   Title Pt will score >/= 20/24 on DGI to indicate decreased risk of falling. Target date: 09/12/15   PT LONG TERM GOAL #3   Title Pt will independently negotiate 9 stairs with single rail and reciprocal pattern to indicate pt independence using primary home entrance. Target date: 09/12/15   PT LONG TERM GOAL #4   Title Pt will independently ambulate 200' over level, indoor surfaces  without assistive device or AFO to progress toward PLOF as independent with household ambulation. Target date: 09/12/15   PT LONG TERM GOAL #5   Title Pt will ambulate >1,000' over outdoor, paved surfaces with mod I using LRAD to indicate increased independence with commnuity mobility. Target date: 09/12/15   PT LONG TERM GOAL #6   Title Pt will negotiate standard ramp and curb step with mod I using LRAD to indicate safety traversing community obstacles. Target date: 09/12/15            Plan - 08/10/15 1551    Clinical Impression Statement Pt progressing towards goals. Pt's  gait presents stable with R AFO with dorsiflexion assist. Pt's gait presents with many deviations with R LE. Pt's gait improves significantly with verbal, tactile and visual cues.     Pt will benefit from skilled therapeutic intervention in order to improve on the following deficits Abnormal gait;Decreased cognition;Decreased balance;Decreased strength;Decreased safety awareness;Impaired vision/preception;Impaired UE functional use;Impaired tone   Rehab Potential Good   Clinical Impairments Affecting Rehab Potential decreased safety awareness; impaired vision in L eye   PT Frequency 2x / week   PT Duration 8 weeks   PT Treatment/Interventions ADLs/Self Care Home Management;Electrical Stimulation;DME Instruction;Gait training;Stair training;Functional mobility training;Therapeutic activities;Therapeutic exercise;Balance training;Neuromuscular re-education;Vestibular;Visual/perceptual remediation/compensation   PT Next Visit Plan Continue gait training without AFO, controlling recurvatum with/without crouched gait pattern. Continue single and double leg stance balance activites on level and unlevel surfaces to promote control of R genu recurvatum.   PT Home Exercise Plan see pt instructions from 11/23   Consulted and Agree with Plan of Care Patient;Family member/caregiver   Family Member Consulted wife, Benjie Karvonen        Problem  List Patient Active Problem List   Diagnosis Date Noted  . 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 Urinary retention 06/23/2015  . Acute respiratory failure ([place]) 06/23/2015  . Traumatic subarachnoid bleed with LOC of 6 hours to 24 hours ([place]) 06/23/2015  . 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 Fall 06/22/2015  . Ocular trauma of right eye 06/22/2015  . TBI (traumatic brain injury) [facility]) 06/16/2015    Laney Potash 08/10/2015, 4:00 PM  [place], [place]  Name: Dakota [name] MRN: [id number] Date of Birth: Feb 06, 1986  This note has been reviewed and edited by supervising CI.  Willow Ora, PTA, [facility] [address] [place], [place] [address] [phone] 08/11/2015, 4:19 PM

## 2015-08-11 NOTE — Therapy (Signed)
[facility] [address] [place], [place], [address] Phone: [phone]   Fax:  [phone]  Occupational Therapy Treatment  Patient Details  Name: Dakota [name] MRN: [id number] Date of Birth: 1986-07-18 No Data Recorded  Encounter Date: 08/10/2015      OT End of Session - 08/11/15 1642    Visit Number 3   Number of Visits 17   Date for OT Re-Evaluation 09/16/15   Authorization Type UHC, awaiting verification of benefits   OT Start Time 1535   OT Stop Time 1615   OT Time Calculation (min) 40 min   Activity Tolerance Patient tolerated treatment well   Behavior During Therapy [facility] for tasks assessed/performed      Past Medical History  Diagnosis Date  . Hx of renal calculi     No past surgical history on file.  There were no vitals filed for this visit.  Visit Diagnosis:  Right hemiparesis (HCC)  Spasticity  Lack of coordination  Inattention  Cognitive deficits      Subjective Assessment - 08/10/15 1541    Subjective  Denies pain   Pertinent History Pt hospitalized on 06/16/15 for TBI with SAH, L eye injury (retrobulbar hemorrhage). Transferred to inpatient rehab on 10/13, where he remained until discharged home on 07/14/15.   Patient Stated Goals use RUE again   Currently in Pain? No/denies         Treatment: Supine chest press with PVC pipe frame, followed by shoulder flexion 2 sets of 10 reps, min facilitation, v.c. Seated weightbearing through tilted stool for AA/ROM shoulder flexion., min facilitation/ v.c. To avoid compensation Therapist initiatied fabrication of resting hand splint for increased hand positioning finger extension for RUE, however did not complete due to time constraints. Anticipate completion next visit.                      OT Short Term Goals - 07/20/15 1758    OT SHORT TERM GOAL #1   Title Pt will be independent with initial HEP--check STGs 08/16/15   Time 4   Period Days   Status New   OT SHORT TERM GOAL #2   Title Pt will demo at least 80* R shoulder flex with minimal compensation in prep for functional reach.   Baseline 70*   Time 4   Status New   OT SHORT TERM GOAL #3   Title Pt will demo at least 50% gross finger ext for grasp/release of objects.   Baseline trace   Time 4   Period Weeks   Status New   OT SHORT TERM GOAL #4   Title Pt will be able to use RUE as gross assist/stabilizer at least 25% of the time for ADLs.   Time 4   Period Weeks   Status New   OT SHORT TERM GOAL #5   Title Pt will grasp/release small cylinder object with min A in 4/5 trials.   Time 4   Period Weeks   Status New   Additional Short Term Goals   Additional Short Term Goals Yes   OT SHORT TERM GOAL #6   Title Pt will be independent with splint wear/care prn.   Time 4   Period Weeks   Status New           OT Long Term Goals - 07/20/15 1804    OT LONG TERM GOAL #1   Title Pt will be independent with updated HEP.--check LTGs 09/16/15  Time 8   Period Weeks   Status New   OT LONG TERM GOAL #2   Title Pt will demo at least 90* R shoulder flex with minimal compensation in prep for functional reach.   Baseline 70*   Time 8   Period Weeks   Status New   OT LONG TERM GOAL #3   Title Pt will improve RUE functional reaching and coordination as shown by scoring at least 8 on box and blocks test.   Baseline 0   Time 8   Period Weeks   Status New   OT LONG TERM GOAL #4   Title Pt will use RUE as gross assist/stabilizer at least 50% of the time for ADLs.   Time 8   Period Weeks   Status New   OT LONG TERM GOAL #5   Title Pt will demo good safety awareness during functional tasks and per family report.   Time 8   Period Weeks   Status New   Long Term Additional Goals   Additional Long Term Goals Yes   OT LONG TERM GOAL #6   Title Pt will verbalize understanding of cognitive and visual compensation strategies prn.   Time 8   Period  Weeks   Status New               Plan - 08/11/15 1640    Clinical Impression Statement Pt  continues to require cueing for normal movement and slower speed when attempting to use RUE.   Pt will benefit from skilled therapeutic intervention in order to improve on the following deficits (Retired) Decreased cognition;Decreased mobility;Decreased strength;Impaired vision/preception;Impaired UE functional use;Pain;Decreased knowledge of use of DME;Decreased balance;Impaired tone;Decreased safety awareness;Decreased coordination;Decreased range of motion;Impaired sensation   Rehab Potential Good   OT Frequency 2x / week   Plan complete resting hand splint and issue.   Consulted and Agree with Plan of Care Patient        Problem List Patient Active Problem List   Diagnosis Date Noted  . 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 Urinary retention 06/23/2015  . Acute respiratory failure (HCC) 06/23/2015  . Traumatic subarachnoid bleed with LOC of 6 hours to 24 hours (HCC) 06/23/2015  . Right spastic hemiparesis (HCC) 06/23/2015  . Fall 06/22/2015  . Ocular trauma of right eye 06/22/2015  . TBI (traumatic brain injury) (HCC) 06/16/2015    RINE,KATHRYN 08/11/2015, 4:44 PM Keene BreathKathryn Rine, OTR/L [phone] Phone: [phone] 4:44 PM 08/11/2015 [facility] [address] [place], [place], [address] Phone: [phone]   Fax:  [phone]  Name: Dakota PartridgeJessie [name] MRN: [id number] Date of Birth: 1986-07-28

## 2015-08-12 ENCOUNTER — Telehealth: Payer: Self-pay

## 2015-08-12 ENCOUNTER — Ambulatory Visit: Payer: 59 | Attending: Physical Medicine & Rehabilitation | Admitting: Physical Therapy

## 2015-08-12 ENCOUNTER — Ambulatory Visit: Payer: 59 | Admitting: Occupational Therapy

## 2015-08-12 ENCOUNTER — Encounter: Payer: Self-pay | Admitting: Physical Therapy

## 2015-08-12 DIAGNOSIS — R279 Unspecified lack of coordination: Secondary | ICD-10-CM | POA: Diagnosis present

## 2015-08-12 DIAGNOSIS — G8191 Hemiplegia, unspecified affecting right dominant side: Secondary | ICD-10-CM | POA: Diagnosis not present

## 2015-08-12 DIAGNOSIS — R2681 Unsteadiness on feet: Secondary | ICD-10-CM

## 2015-08-12 DIAGNOSIS — R4189 Other symptoms and signs involving cognitive functions and awareness: Secondary | ICD-10-CM | POA: Diagnosis present

## 2015-08-12 DIAGNOSIS — R269 Unspecified abnormalities of gait and mobility: Secondary | ICD-10-CM | POA: Insufficient documentation

## 2015-08-12 DIAGNOSIS — R258 Other abnormal involuntary movements: Secondary | ICD-10-CM | POA: Insufficient documentation

## 2015-08-12 DIAGNOSIS — R252 Cramp and spasm: Secondary | ICD-10-CM

## 2015-08-12 DIAGNOSIS — R41842 Visuospatial deficit: Secondary | ICD-10-CM | POA: Diagnosis present

## 2015-08-12 DIAGNOSIS — R41840 Attention and concentration deficit: Secondary | ICD-10-CM | POA: Insufficient documentation

## 2015-08-12 DIAGNOSIS — R52 Pain, unspecified: Secondary | ICD-10-CM | POA: Diagnosis present

## 2015-08-12 MED ORDER — TRAZODONE HCL 50 MG PO TABS: 50.0000 mg | ORAL_TABLET | Freq: Every day | ORAL | Status: DC

## 2015-08-12 NOTE — Patient Instructions (Signed)
Your Splint This splint should initially be fitted by a healthcare practitioner.  The healthcare practitioner is responsible for providing wearing instructions and precautions to the patient, other healthcare practitioners and care provider involved in the patient's care.  This splint was custom made for you. Please read the following instructions to learn about wearing and caring for your splint.  Precautions Should your splint cause any of the following problems, remove the splint immediately and contact your therapist/physician.  Swelling  Severe Pain  Pressure Areas  Stiffness  Numbness  Do not wear your splint while operating machinery unless it has been fabricated for that purpose.  When To Wear Your Splint Where your splint according to your therapist/physician instructions. Daytime for 1 hours today, if no problems wear 2-3 hrs while at rest several times during the day. If any problems remove splint, stop wearing and bring to your next therapy visit,. radually increase wear time during day to 4-6 hrs, if no problems you may transition to wearing at night, in about 1 week.  Care and Cleaning of Your Splint 1. Keep your splint away from open flames. 2. Your splint will lose its shape in temperatures over 135 degrees Farenheit, ( in car windows, near radiators, ovens or in hot water).  Never make any adjustments to your splint, if the splint needs adjusting remove it and make an appointment to see your therapist. 3. Your splint, including the cushion liner may be cleaned with soap and lukewarm water.  Do not immerse in hot water over 135 degrees Farenheit. 4. Straps may be washed with soap and water, but do not moisten the self-adhesive portion. 5.

## 2015-08-12 NOTE — Therapy (Signed)
[facility] [address] [place], [place], [address] Phone: [phone]   Fax:  [phone]  Occupational Therapy Treatment  Patient Details  Name: Dakota [name] MRN: [id number] Date of Birth: [birth year] No Data Recorded  Encounter Date: 08/12/2015      OT End of Session - 08/11/15 1642    Visit Number 3   Number of Visits 17   Date for OT Re-Evaluation 09/16/15   Authorization Type UHC, awaiting verification of benefits   OT Start Time 1535   OT Stop Time 1615   OT Time Calculation (min) 40 min   Activity Tolerance Patient tolerated treatment well   Behavior During Therapy [facility] for tasks assessed/performed      Past Medical History  Diagnosis Date  . Hx of renal calculi     No past surgical history on file.  There were no vitals filed for this visit.  Visit Diagnosis:  Right hemiparesis (HCC)  Spasticity      Subjective Assessment - 08/12/15 1655    Patient is accompained by: Family member   Pertinent History Pt hospitalized on 06/16/15 for TBI with SAH, L eye injury (retrobulbar hemorrhage). Transferred to inpatient rehab on 10/13, where he remained until discharged home on 07/14/15.   Patient Stated Goals use RUE again   Currently in Pain? No/denies        Treatment: Therapist completed fitting of resting hand splint and educated pt/ father in splint wear, care and precautions. Pt practiced donning/ doffing correctly and returned ddemo.                        OT Education - 08/12/15 1655    Education provided Yes   Education Details splint wear, care and precautions   Person(s) Educated Patient;Parent(s)   Methods Explanation;Demonstration;Verbal cues   Comprehension Verbalized understanding;Returned demonstration          OT Short Term Goals - 07/20/15 1758    OT SHORT TERM GOAL #1   Title Pt will be independent with initial HEP--check STGs 08/16/15   Time 4   Period  Days   Status New   OT SHORT TERM GOAL #2   Title Pt will demo at least 80* R shoulder flex with minimal compensation in prep for functional reach.   Baseline 70*   Time 4   Status New   OT SHORT TERM GOAL #3   Title Pt will demo at least 50% gross finger ext for grasp/release of objects.   Baseline trace   Time 4   Period Weeks   Status New   OT SHORT TERM GOAL #4   Title Pt will be able to use RUE as gross assist/stabilizer at least 25% of the time for ADLs.   Time 4   Period Weeks   Status New   OT SHORT TERM GOAL #5   Title Pt will grasp/release small cylinder object with min A in 4/5 trials.   Time 4   Period Weeks   Status New   Additional Short Term Goals   Additional Short Term Goals Yes   OT SHORT TERM GOAL #6   Title Pt will be independent with splint wear/care prn.   Time 4   Period Weeks   Status New           OT Long Term Goals - 07/20/15 1804    OT LONG TERM GOAL #1   Title Pt will be independent with  updated HEP.--check LTGs 09/16/15   Time 8   Period Weeks   Status New   OT LONG TERM GOAL #2   Title Pt will demo at least 90* R shoulder flex with minimal compensation in prep for functional reach.   Baseline 70*   Time 8   Period Weeks   Status New   OT LONG TERM GOAL #3   Title Pt will improve RUE functional reaching and coordination as shown by scoring at least 8 on box and blocks test.   Baseline 0   Time 8   Period Weeks   Status New   OT LONG TERM GOAL #4   Title Pt will use RUE as gross assist/stabilizer at least 50% of the time for ADLs.   Time 8   Period Weeks   Status New   OT LONG TERM GOAL #5   Title Pt will demo good safety awareness during functional tasks and per family report.   Time 8   Period Weeks   Status New   Long Term Additional Goals   Additional Long Term Goals Yes   OT LONG TERM GOAL #6   Title Pt will verbalize understanding of cognitive and visual compensation strategies prn.   Time 8   Period Weeks   Status  New               Plan - 08/12/15 1655    Clinical Impression Statement Pt demonstrates improved postioning when wearing resting hand splint. Pt/ father verbalize understanding of wear and care.   OT Frequency 2x / week   OT Duration 8 weeks   OT Treatment/Interventions Self-care/ADL training;Cryotherapy;Parrafin;Electrical Stimulation;Fluidtherapy;Moist Heat;Contrast Bath;Passive range of motion;Visual/perceptual remediation/compensation;Cognitive remediation/compensation;DME and/or AE instruction;Therapeutic activities;Therapeutic exercises;Neuromuscular education;Ultrasound;Manual Therapy;Splinting;Patient/family education;Building services engineer;Therapeutic exercise   Plan splint check, NMR   Consulted and Agree with Plan of Care Patient;Family member/caregiver   Family Member Consulted father        Problem List Patient Active Problem List   Diagnosis Date Noted  . 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 Urinary retention 06/23/2015  . Acute respiratory failure (HCC) 06/23/2015  . Traumatic subarachnoid bleed with LOC of 6 hours to 24 hours (HCC) 06/23/2015  . Right spastic hemiparesis (HCC) 06/23/2015  . Fall 06/22/2015  . Ocular trauma of right eye 06/22/2015  . TBI (traumatic brain injury) (HCC) 06/16/2015    RINE,KATHRYN 08/12/2015, 4:58 PM Keene Breath, OTR/L [phone] Phone: [phone] 4:58 PM 08/12/2015 [facility] [address] [place], [place], [address] Phone: [phone]   Fax:  [phone]  Name: Dakota [name] MRN: [id number] Date of Birth: [birth year]

## 2015-08-12 NOTE — Therapy (Signed)
[facility] [address] [place], [place], [address] Phone: [phone]   Fax:  [phone]  Physical Therapy Treatment  Patient Details  Name: Dakota [name] MRN: [id number] Date of Birth: 1986/07/25 Referring Provider: Dwana Melena, MD  Encounter Date: 08/12/2015      PT End of Session - 08/12/15 1316    Visit Number 5   Date for PT Re-Evaluation 09/16/15   Authorization Type UHC   PT Start Time 0845   PT Stop Time 0930   PT Time Calculation (min) 45 min   Equipment Utilized During Treatment Gait belt   Behavior During Therapy [facility] for tasks assessed/performed      Past Medical History  Diagnosis Date  . Hx of renal calculi     History reviewed. No pertinent past surgical history.  There were no vitals filed for this visit.  Visit Diagnosis:  Right hemiparesis ([place])  Spasticity  Lack of coordination  Abnormality of gait  Unsteadiness      Subjective Assessment - 08/12/15 1311    Subjective Pt reports no changes in status. Pt reports ambulating with cane only when he goes outside. Pt reports that he will have to get used to the issued 1/4 inch heel wedge for controlling recurvatum without brace as being used in session today.    Currently in Pain? No/denies   Multiple Pain Sites No     NMR: To promote control of genu recurvatum on R LE and to normalize gait for increased stability and decreased risk of falls. Pt supervision to min guard assist with all activities.  1. Pt ambulated 6 laps along long edge of track using mirrors and floor line for visual feedback. Pt did not use cane or AFO, but 1/4 inch heel wedge was issued to control genu recurvatum(GR) when AFO was not being used. Pt supervision to min guard assist with verbal visual and tactile cues for controlling R knee extension, foot clearance, lateral circumduction, and lateral toe whip.   2. Pt completed 1 set of step ups on 6in curb with  R LE X20 reps. Pt required cues to find neutral position upon extension of knee. Pt was attempting to control GR by maintaining knee flexion.  3. Pt completed 4 reps of ramp up and down for learned control of GR. Pt also completed 4 more reps up and down ramp with compliant blue mat for increased challenge. Verbal cue only to control GR.  4. Pt completed 5 laps of 6 hurdles without Theraband assist. Pt alternated leading foot half way through laps. This challenged pt's GR control on R and challenged foot clearance on R.  5. Pt completed 5 laps of 6 hurdles with Theraband assist. Pt alternated leading foot to challenge the R extremity in multi ways. Pt was advised to lead with R foot when negotiating over obstacles when outside therapy. Pt min guard assist for decreased stability. Cues for R foot clearance.  6. Pt completed 6 more laps of gait to assess Theraband assist carry over. Pt with increased foot clearance and GR control and decreased lateral circumduction and lateral toe whip.   7. R Dorsiflexion with green Theraband with R heel on 4 inch block for max ROM: Pt completed 1 set of 25 reps for carryover to gait for increased foot clearance. Pt supervision with no verbal cues.       PT Education - 08/12/15 1315    Education provided Yes   Education Details Pt educated on  use of mirror in home to use for visual feedback to control R circumduction, lateral toe whip, and foot clearance.          PT Short Term Goals - 08/12/15 1322    PT SHORT TERM GOAL #1   Title Pt will perform initial HEP with mod I using paper handout to maximize functional gains made in PT. Target date: 08/15/15   Baseline Met 11/23.   Status Achieved   PT SHORT TERM GOAL #2   Title Pt will negotiate 9 stairs with single rail and mod I (no cueing for safety awareness) to indicate safety using primary home entrance. Target date: 08/15/15   PT SHORT TERM GOAL #3   Title Pt will increase gait velocity from 1.97 ft/sec  to 2.27 ft/sec to indicate increase efficiency of ambulation. Target date: 08/15/15   PT SHORT TERM GOAL #4   Title Pt will independently ambulate 200' without AD using AFO to progress toward PLOF and pt-stated goal of independent ambulation. Target date: 08/15/15   PT SHORT TERM GOAL #5   Title Pt will ambulate 500' over unlevel, paved surfaces with mod I using LRAD to indicate increased safety with limited community mobility. Target date: 08/15/15   PT SHORT TERM GOAL #6   Title Complete DGI to assess dynamic gait stability. Target date: 08/15/15   Baseline 11/18: DGI score = 13/24   Status Achieved   PT SHORT TERM GOAL #7   Title                PT Long Term Goals - 08/12/15 1322    PT LONG TERM GOAL #1   Title Pt will increase gait velocity from 1.97 ft/sec to > 2.62 ft/sec to indicate status of community ambulator. Target date: 09/12/15   PT LONG TERM GOAL #2   Title Pt will score >/= 20/24 on DGI to indicate decreased risk of falling. Target date: 09/12/15   PT LONG TERM GOAL #3   Title Pt will independently negotiate 9 stairs with single rail and reciprocal pattern to indicate pt independence using primary home entrance. Target date: 09/12/15   PT LONG TERM GOAL #4   Title Pt will independently ambulate 200' over level, indoor surfaces without assistive device or AFO to progress toward PLOF as independent with household ambulation. Target date: 09/12/15   PT LONG TERM GOAL #5   Title Pt will ambulate >1,000' over outdoor, paved surfaces with mod I using LRAD to indicate increased independence with commnuity mobility. Target date: 09/12/15   PT LONG TERM GOAL #6   Title Pt will negotiate standard ramp and curb step with mod I using LRAD to indicate safety traversing community obstacles. Target date: 09/12/15               Plan - 08/12/15 1317    Clinical Impression Statement Skilled treatment session focused on activities to challenge R knee joint stability, toe clearance and to correct  gait deviations. Pt's R knee control is improving. Pt's gait is improving with decreased cues for correcting deviations. Pt's circumduction and foot clearance is improving. Pt issued 1/4 inch heel wedge to control R genu recurvatum while not using AFO (in home only for short distances when not fatigued).   Pt will benefit from skilled therapeutic intervention in order to improve on the following deficits Abnormal gait;Decreased cognition;Decreased balance;Decreased strength;Decreased safety awareness;Impaired vision/preception;Impaired UE functional use;Impaired tone   Rehab Potential Good   Clinical Impairments Affecting Rehab Potential decreased safety  awareness; impaired vision in L eye   PT Frequency 2x / week   PT Duration 8 weeks   PT Treatment/Interventions ADLs/Self Care Home Management;Electrical Stimulation;DME Instruction;Gait training;Stair training;Functional mobility training;Therapeutic activities;Therapeutic exercise;Balance training;Neuromuscular re-education;Vestibular;Visual/perceptual remediation/compensation   PT Next Visit Plan Assess remaining STG's not met.Continue gait training without AFO (use of ankle brace as needed). Continue use of theraband dorsiflexion assist and knee control wrap for carry over during gait. Continue balance and strengthening activities with single leg stance to improve control of genu recurvatum.    PT Home Exercise Plan see pt instructions from 11/23   Consulted and Agree with Plan of Care Patient;Family member/caregiver   Family Member Consulted Pt's dad        Problem List Patient Active Problem List   Diagnosis Date Noted  . 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 Urinary retention 06/23/2015  . Acute respiratory failure ([place]) 06/23/2015  . Traumatic subarachnoid bleed with LOC of 6 hours to 24 hours ([place]) 06/23/2015  . Right spastic hemiparesis ([place]) 06/23/2015  . Fall 06/22/2015  . Ocular trauma of right eye 06/22/2015  . TBI (traumatic  brain injury) [facility]) 06/16/2015    Laney Potash 08/12/2015, 1:27 PM  Laney Potash, [place]  Name: Ramses Klecka MRN: [id number] Date of Birth: November 27, 1985  This note has been reviewed and edited by supervising CI.  Willow Ora, PTA, [place] [address] [place], [place] [address] [phone] 08/14/2015, 10:51 PM

## 2015-08-12 NOTE — Telephone Encounter (Signed)
Pt called, complaining that he is still having issues sleeping. He stated that you told him to call if he was still having issues. Please advise. Thanks!

## 2015-08-12 NOTE — Telephone Encounter (Signed)
i have sent in an rx for trazodone 50mg  qhs

## 2015-08-17 ENCOUNTER — Encounter: Payer: Self-pay | Admitting: Physical Therapy

## 2015-08-17 ENCOUNTER — Ambulatory Visit: Payer: 59 | Admitting: Physical Therapy

## 2015-08-17 ENCOUNTER — Ambulatory Visit: Payer: 59 | Admitting: Occupational Therapy

## 2015-08-17 DIAGNOSIS — R252 Cramp and spasm: Secondary | ICD-10-CM

## 2015-08-17 DIAGNOSIS — R4189 Other symptoms and signs involving cognitive functions and awareness: Secondary | ICD-10-CM

## 2015-08-17 DIAGNOSIS — R279 Unspecified lack of coordination: Secondary | ICD-10-CM

## 2015-08-17 DIAGNOSIS — R269 Unspecified abnormalities of gait and mobility: Secondary | ICD-10-CM

## 2015-08-17 DIAGNOSIS — G8191 Hemiplegia, unspecified affecting right dominant side: Secondary | ICD-10-CM | POA: Diagnosis not present

## 2015-08-17 DIAGNOSIS — R2681 Unsteadiness on feet: Secondary | ICD-10-CM

## 2015-08-17 NOTE — Therapy (Signed)
[facility] [address] [place], [place], [address] Phone: [phone]   Fax:  [phone]  Physical Therapy Treatment  Patient Details  Name: Dakota [name] MRN: [id number] Date of Birth: [birth year] Referring Provider: Dwana Melena, MD  Encounter Date: 08/17/2015      PT End of Session - 08/17/15 1631    Visit Number 6   Date for PT Re-Evaluation 09/16/15   Authorization Type UHC   PT Start Time 3976   PT Stop Time 1615   PT Time Calculation (min) 45 min   Equipment Utilized During Treatment Gait belt   Behavior During Therapy [facility] for tasks assessed/performed      Past Medical History  Diagnosis Date  . Hx of renal calculi     History reviewed. No pertinent past surgical history.  There were no vitals filed for this visit.  Visit Diagnosis:  Right hemiparesis ([place])  Spasticity  Lack of coordination  Abnormality of gait  Unsteadiness      Subjective Assessment - 08/17/15 1622    Subjective Pt reports 3/10 pain in his R hip. Pt reports taking seated rest breaks if he is weight bearing for over 78mn due to pain. Pt's spouse reports not doing LE HEP. Pt does report attempting gait indoors without AFO to challenge R knee stability.   Currently in Pain? Yes   Pain Score 3    Pain Location Hip   Pain Orientation Right   Pain Descriptors / Indicators Aching   Pain Type Acute pain   Pain Onset In the past 7 days   Pain Frequency Intermittent   Aggravating Factors  Extended periods of weight bearing.   Pain Relieving Factors Seated rest breaks   Multiple Pain Sites No     Gait training: To maximize pt's functional independence with gait in the home and out in the community.  1. Pt ambulated 5074fX1 on outdoor, level, unlevel, and paved surfaces with cane and AFO. Pt mod i on level surface and supervision on unlevel grass.  2. Pt ambulated 2002f1 on indoor, level surfaces with AFO only. Pt mod  I on indoor level surfaces with use of AFO for foot clearance and R knee GR control.  NMR: To increase balance for reduced risk of falls during gait and to strengthen R LE for R knee GR control. All NMR occurred without AD or AFO.  1. Pt negotiated 4 laps on compliant surface of cone tip overs and tip ups with alternating leading LE. Pt min guard assist for safety. Cues for R knee GR control. 2. Pt negotiated 5 laps on compliant surface of 5 hurdles while alternating leading LE. Pt min guard assist for safety. Cues for R knee GR control. 3. Double leg stance on BOSU in parallel bars: 2 min X2 in normal stance. 1 set eyes open and 1 set eyes closed. Pt min guard due to increased instability. 4. Pt completed 7 laps in hallway with use of floor and walls for visual feedback to maintain a straight path. Pt supervision with normal stance and min guard in tandem stance.  A. 1 laps with normal stance and horizontal head turns. Minimal veering.  B. 1 laps with normal stance and vertical head turns. Minimal veering.  C. 3 laps with normal stance and diagonal head turns. Multi instances of veering.  D. 2 laps in tandem stance with diagonal head turns. Multi veering and LOB X2. Pt able to right balance.  PT Education - 08/17/15 1627    Education provided Yes   Education Details Pt educated to adhere to HEP for strengthening B LE's and managing pain in R hip. Pt verbalized understanding that is is worth putting HEP at a high priority.   Person(s) Educated Patient   Methods Explanation   Comprehension Verbalized understanding          PT Short Term Goals - 08/17/15 1632    PT SHORT TERM GOAL #1   Title Pt will perform initial HEP with mod I using paper handout to maximize functional gains made in PT. Target date: 08/15/15   Baseline Met 11/23.   Status Achieved   PT SHORT TERM GOAL #2   Title Pt will negotiate 9 stairs with single rail and mod I (no cueing for safety awareness) to indicate  safety using primary home entrance. Target date: 08/15/15   Baseline Achieved: 08/17/15. Pt negotiated 12 stairs mod I.   Status Achieved   PT SHORT TERM GOAL #3   Title Pt will increase gait velocity from 1.97 ft/sec to 2.27 ft/sec to indicate increase efficiency of ambulation. Target date: 08/15/15   Baseline Achieved: 08/17/15. Pt achieve 2.65f/sec gait speed.   Status Achieved   PT SHORT TERM GOAL #4   Title Pt will independently ambulate 200' without AD using AFO to progress toward PLOF and pt-stated goal of independent ambulation. Target date: 08/15/15   Baseline Achieved: 08/17/15. Pt I with ambulating 2024fwithout AD with AFO on indoor, level surfaces.   Status Achieved   PT SHORT TERM GOAL #5   Title Pt will ambulate 500' over unlevel, paved surfaces with mod I using LRAD to indicate increased safety with limited community mobility. Target date: 08/15/15   Baseline Assessed: 08/17/15. Pt supervision on unlevel, grass. Pt mod I on outdoor,  level, paved surface.    Status Partially Met   PT SHORT TERM GOAL #6   Title Complete DGI to assess dynamic gait stability. Target date: 08/15/15   Baseline 11/18: DGI score = 13/24   Status Achieved   PT SHORT TERM GOAL #7   Title                PT Long Term Goals - 08/17/15 1648    PT LONG TERM GOAL #1   Title Pt will increase gait velocity from 1.97 ft/sec to > 2.62 ft/sec to indicate status of community ambulator. Target date: 09/12/15   PT LONG TERM GOAL #2   Title Pt will score >/= 20/24 on DGI to indicate decreased risk of falling. Target date: 09/12/15   PT LONG TERM GOAL #3   Title Pt will independently negotiate 9 stairs with single rail and reciprocal pattern to indicate pt independence using primary home entrance. Target date: 09/12/15   PT LONG TERM GOAL #4   Title Pt will independently ambulate 200' over level, indoor surfaces without assistive device or AFO to progress toward PLOF as independent with household ambulation. Target date:  09/12/15   PT LONG TERM GOAL #5   Title Pt will ambulate >1,000' over outdoor, paved surfaces with mod I using LRAD to indicate increased independence with commnuity mobility. Target date: 09/12/15   PT LONG TERM GOAL #6   Title Pt will negotiate standard ramp and curb step with mod I using LRAD to indicate safety traversing community obstacles. Target date: 09/12/15               Plan - 08/17/15 1639  Clinical Impression Statement Skilled treatment session focused on assessing pt's remaining STG's as well as increasing control and strength of R LE, especially the knee. The pt also completed static and dynamic balance activities to increase stability during gait. Pt achieved STG's2-4 and partially met STG 5. Pt is able to ambulate mod I on level surfaces with use of cane on outdoor pavement and no AD on indoor level surfaces. with use of AFO. Pt requires cues for control of R knee GR. Pt's balance presents decreased with veering when attempting to maintain straight path in hallway with diagonal head turns.     Pt will benefit from skilled therapeutic intervention in order to improve on the following deficits Abnormal gait;Decreased cognition;Decreased balance;Decreased strength;Decreased safety awareness;Impaired vision/preception;Impaired UE functional use;Impaired tone   Rehab Potential Good   Clinical Impairments Affecting Rehab Potential decreased safety awareness; impaired vision in L eye   PT Frequency 2x / week   PT Duration 8 weeks   PT Treatment/Interventions ADLs/Self Care Home Management;Electrical Stimulation;DME Instruction;Gait training;Stair training;Functional mobility training;Therapeutic activities;Therapeutic exercise;Balance training;Neuromuscular re-education;Vestibular;Visual/perceptual remediation/compensation   PT Next Visit Plan Continue single leg stance balance activities, static and dynamic. Gait on outside, unlevel surfaces or on indoor compliant surface with cane  without AFO to challenge pt's balance and R knee GR control. Gait with eyes closed to challenge balance.     PT Home Exercise Plan see pt instructions from 11/23   Consulted and Agree with Plan of Care Patient;Family member/caregiver   Family Member Consulted wife, Benjie Karvonen        Problem List Patient Active Problem List   Diagnosis Date Noted  . 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 Urinary retention 06/23/2015  . Acute respiratory failure ([place]) 06/23/2015  . Traumatic subarachnoid bleed with LOC of 6 hours to 24 hours ([place]) 06/23/2015  . Right spastic hemiparesis ([place]) 06/23/2015  . Fall 06/22/2015  . Ocular trauma of right eye 06/22/2015  . TBI (traumatic brain injury) [facility]) 06/16/2015    Laney Potash 08/17/2015, 4:50 PM  Laney Potash, [place]  Name: Yannick Steuber MRN: [id number] Date of Birth: 1986-02-28  This note has been reviewed and edited by supervising CI.  Willow Ora, PTA, [facility] [address] [place], [place] [address] [phone] 08/19/2015, 8:56 AM

## 2015-08-17 NOTE — Therapy (Signed)
[facility] [address] [place], [place], [address] Phone: [phone]   Fax:  [phone]  Occupational Therapy Treatment  Patient Details  Name: Dakota [name] MRN: [id number] Date of Birth: Jan 31, 1986 No Data Recorded  Encounter Date: 08/17/2015      OT End of Session - 08/17/15 1733    Visit Number 4   Number of Visits 17   Date for OT Re-Evaluation 09/16/15   Authorization Type UHC, awaiting verification of benefits   OT Start Time 1621   OT Stop Time 1700   OT Time Calculation (min) 39 min   Activity Tolerance Patient tolerated treatment well      Past Medical History  Diagnosis Date  . Hx of renal calculi     No past surgical history on file.  There were no vitals filed for this visit.  Visit Diagnosis:  Right hemiparesis ([facility])  Spasticity  Lack of coordination  Cognitive deficits      Subjective Assessment - 08/17/15 1744    Patient is accompained by: Family member   Pertinent History Pt hospitalized on 06/16/15 for TBI with SAH, L eye injury (retrobulbar hemorrhage). Transferred to inpatient rehab on 10/13, where he remained until discharged home on 07/14/15.   Patient Stated Goals use RUE again   Currently in Pain? Yes   Pain Score 3    Pain Location Arm   Pain Orientation Right   Pain Descriptors / Indicators Aching   Pain Type Acute pain   Pain Onset More than a month ago   Pain Frequency Intermittent   Aggravating Factors  malpostioning   Pain Relieving Factors reposistioning   Multiple Pain Sites No      Treatment: Supine closed chain shoulder flexion with PVC pipe frame followed by sidelying on right side then rolling onto and off of right side , with arm in slight abduction. Therapist recommends pt consider sleeping in this position, pt/ wife verbalize understanding. Shoulder flexion AA/ROM in sidelying(left side with mod facilitation. ) Weightbearing on elbow in sidelying yet pt  reports discomfort and therefore therapist transitioned him to seated position. In seated weightbearing through elbow while performing lateral trunk flexion x 5 reps mod facilitation.  NMEs 50 pps, 250 pw, 10 secs cycle to wrist and finger extensors intensity 29 x 8 mins, with pt demonstrating good response for wrist and finger extension. No adverse reactions.                         OT Short Term Goals - 07/20/15 1758    OT SHORT TERM GOAL #1   Title Pt will be independent with initial HEP--check STGs 08/16/15   Time 4   Period Days   Status New   OT SHORT TERM GOAL #2   Title Pt will demo at least 80* R shoulder flex with minimal compensation in prep for functional reach.   Baseline 70*   Time 4   Status New   OT SHORT TERM GOAL #3   Title Pt will demo at least 50% gross finger ext for grasp/release of objects.   Baseline trace   Time 4   Period Weeks   Status New   OT SHORT TERM GOAL #4   Title Pt will be able to use RUE as gross assist/stabilizer at least 25% of the time for ADLs.   Time 4   Period Weeks   Status New   OT SHORT TERM GOAL #5  Title Pt will grasp/release small cylinder object with min A in 4/5 trials.   Time 4   Period Weeks   Status New   Additional Short Term Goals   Additional Short Term Goals Yes   OT SHORT TERM GOAL #6   Title Pt will be independent with splint wear/care prn.   Time 4   Period Weeks   Status New           OT Long Term Goals - 07/20/15 1804    OT LONG TERM GOAL #1   Title Pt will be independent with updated HEP.--check LTGs 09/16/15   Time 8   Period Weeks   Status New   OT LONG TERM GOAL #2   Title Pt will demo at least 90* R shoulder flex with minimal compensation in prep for functional reach.   Baseline 70*   Time 8   Period Weeks   Status New   OT LONG TERM GOAL #3   Title Pt will improve RUE functional reaching and coordination as shown by scoring at least 8 on box and blocks test.   Baseline 0    Time 8   Period Weeks   Status New   OT LONG TERM GOAL #4   Title Pt will use RUE as gross assist/stabilizer at least 50% of the time for ADLs.   Time 8   Period Weeks   Status New   OT LONG TERM GOAL #5   Title Pt will demo good safety awareness during functional tasks and per family report.   Time 8   Period Weeks   Status New   Long Term Additional Goals   Additional Long Term Goals Yes   OT LONG TERM GOAL #6   Title Pt will verbalize understanding of cognitive and visual compensation strategies prn.   Time 8   Period Weeks   Status New               Plan - 08/17/15 1654    Clinical Impression Statement Pt is progressing slowly towards goals, limited by spasticity, and impulsivity. Pt reports splint is fitting well.   Pt will benefit from skilled therapeutic intervention in order to improve on the following deficits (Retired) Decreased cognition;Decreased mobility;Decreased strength;Impaired vision/preception;Impaired UE functional use;Pain;Decreased knowledge of use of DME;Decreased balance;Impaired tone;Decreased safety awareness;Decreased coordination;Decreased range of motion;Impaired sensation   Rehab Potential Good   OT Frequency 2x / week   OT Duration 8 weeks   OT Treatment/Interventions Self-care/ADL training;Cryotherapy;Parrafin;Electrical Stimulation;Fluidtherapy;Moist Heat;Contrast Bath;Passive range of motion;Visual/perceptual remediation/compensation;Cognitive remediation/compensation;DME and/or AE instruction;Therapeutic activities;Therapeutic exercises;Neuromuscular education;Ultrasound;Manual Therapy;Splinting;Patient/family education;Building services engineerunctional Mobility Training;Therapeutic exercise   Plan NMR with gentle weigthbearing through LUE, emphasis on normal movements in supine/ seated   Consulted and Agree with Plan of Care Patient;Family member/caregiver   Family Member Consulted wife        Problem List Patient Active Problem List   Diagnosis Date Noted   . 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 Urinary retention 06/23/2015  . Acute respiratory failure ([facility]) 06/23/2015  . Traumatic subarachnoid bleed with LOC of 6 hours to 24 hours ([facility]) 06/23/2015  . Right spastic hemiparesis ([facility]) 06/23/2015  . Fall 06/22/2015  . Ocular trauma of right eye 06/22/2015  . TBI (traumatic brain injury) ([facility]) 06/16/2015    Audon Heymann 08/17/2015, 5:45 PM Keene BreathKathryn Junelle Hashemi, OTR/L [phone] Phone: [phone] 5:45 PM 08/17/2015 [facility] [address] [place], [place], [address] Phone: [phone]   Fax:  [phone]  Name: Dakota [name] MRN: [id number] Date of Birth: Oct 22, 1985

## 2015-08-19 ENCOUNTER — Ambulatory Visit: Payer: 59 | Admitting: *Deleted

## 2015-08-19 ENCOUNTER — Ambulatory Visit: Payer: 59 | Admitting: Physical Therapy

## 2015-08-19 ENCOUNTER — Encounter: Payer: Self-pay | Admitting: Physical Therapy

## 2015-08-19 DIAGNOSIS — R279 Unspecified lack of coordination: Secondary | ICD-10-CM

## 2015-08-19 DIAGNOSIS — R2681 Unsteadiness on feet: Secondary | ICD-10-CM

## 2015-08-19 DIAGNOSIS — R269 Unspecified abnormalities of gait and mobility: Secondary | ICD-10-CM

## 2015-08-19 DIAGNOSIS — G8191 Hemiplegia, unspecified affecting right dominant side: Secondary | ICD-10-CM

## 2015-08-19 DIAGNOSIS — R252 Cramp and spasm: Secondary | ICD-10-CM

## 2015-08-19 NOTE — Therapy (Signed)
[facility] [address] [place], [place], [address] Phone: [phone]   Fax:  [phone]  Physical Therapy Treatment  Patient Details  Name: Dakota [name] MRN: [id number] Date of Birth: 1986/05/04 Referring Provider: Dwana Melena, MD  Encounter Date: 08/19/2015   08/19/15 1539  PT Visits / Re-Eval  Visit Number 7  Date for PT Re-Evaluation 09/16/15  Authorization  Authorization Type UHC  PT Time Calculation  PT Start Time 1535  PT Stop Time 1615  PT Time Calculation (min) 40 min  PT - End of Session  Equipment Utilized During Treatment Gait belt  Behavior During Therapy [facility] for tasks assessed/performed     Past Medical History  Diagnosis Date  . Hx of renal calculi     History reviewed. No pertinent past surgical history.  There were no vitals filed for this visit.  Visit Diagnosis:   Lack of coordination    Abnormality of gait    Unsteadiness    Right hemiparesis ([place])     Spasticity       Subjective Assessment - 08/19/15 1537    Subjective No new complaints. No hip pain today. No falls to report. Has been doing the leg exercises since the last visit as his wife is "making him"   Currently in Pain? No/denies   Pain Score 0-No pain     Treatment: Neuro re-ed: Sit<>stands without UE support: min guard assist to min assist with cues on posture, weight shifting onto right side With left foot on 4 in block x 10 reps With left foot on 6 inch block x 10 reps  On balance board both ways: cues on equal weight bearing with level position being maintained during sit<>stands, x 10 reps each way  Standing with left foot on block Right single leg mini squats 2 sets of 10 reps  Standing on right leg: verbal and tactile cues to prevent knee hyperextension during activities - left foot taps to 6 inch box x 10 reps - star taps x 5 points with left foot, 5 laps completed each way  Tall kneeling -  partial sit backs concurrent with UE bicep curls with ball x 10 reps - fwd/bwd gait across mat x 3 laps - lateral stepping x 3 laps each way   Gait With cane, no brace, heel wedge. 420 feet with recuvatum towards end of gait trail.cues on posture and for left quad activation for increased knee control in stance phase.        PT Short Term Goals - 08/17/15 1632    PT SHORT TERM GOAL #1   Title Pt will perform initial HEP with mod I using paper handout to maximize functional gains made in PT. Target date: 08/15/15   Baseline Met 11/23.   Status Achieved   PT SHORT TERM GOAL #2   Title Pt will negotiate 9 stairs with single rail and mod I (no cueing for safety awareness) to indicate safety using primary home entrance. Target date: 08/15/15   Baseline Achieved: 08/17/15. Pt negotiated 12 stairs mod I.   Status Achieved   PT SHORT TERM GOAL #3   Title Pt will increase gait velocity from 1.97 ft/sec to 2.27 ft/sec to indicate increase efficiency of ambulation. Target date: 08/15/15   Baseline Achieved: 08/17/15. Pt achieve 2.74f/sec gait speed.   Status Achieved   PT SHORT TERM GOAL #4   Title Pt will independently ambulate 200' without AD using AFO to progress toward PLOF and pt-stated  goal of independent ambulation. Target date: 08/15/15   Baseline Achieved: 08/17/15. Pt I with ambulating 274f without AD with AFO on indoor, level surfaces.   Status Achieved   PT SHORT TERM GOAL #5   Title Pt will ambulate 500' over unlevel, paved surfaces with mod I using LRAD to indicate increased safety with limited community mobility. Target date: 08/15/15   Baseline Assessed: 08/17/15. Pt supervision on unlevel, grass. Pt mod I on outdoor,  level, paved surface.    Status Partially Met   PT SHORT TERM GOAL #6   Title Complete DGI to assess dynamic gait stability. Target date: 08/15/15   Baseline 11/18: DGI score = 13/24   Status Achieved   PT SHORT TERM GOAL #7   Title                PT Long Term  Goals - 08/17/15 1648    PT LONG TERM GOAL #1   Title Pt will increase gait velocity from 1.97 ft/sec to > 2.62 ft/sec to indicate status of community ambulator. Target date: 09/12/15   PT LONG TERM GOAL #2   Title Pt will score >/= 20/24 on DGI to indicate decreased risk of falling. Target date: 09/12/15   PT LONG TERM GOAL #3   Title Pt will independently negotiate 9 stairs with single rail and reciprocal pattern to indicate pt independence using primary home entrance. Target date: 09/12/15   PT LONG TERM GOAL #4   Title Pt will independently ambulate 200' over level, indoor surfaces without assistive device or AFO to progress toward PLOF as independent with household ambulation. Target date: 09/12/15   PT LONG TERM GOAL #5   Title Pt will ambulate >1,000' over outdoor, paved surfaces with mod I using LRAD to indicate increased independence with commnuity mobility. Target date: 09/12/15   PT LONG TERM GOAL #6   Title Pt will negotiate standard ramp and curb step with mod I using LRAD to indicate safety traversing community obstacles. Target date: 09/12/15        08/19/15 1539  Plan  Clinical Impression Statement Continued to focus on left knee control and strength with stance. Pt making steady progress toward goals.   Pt will benefit from skilled therapeutic intervention in order to improve on the following deficits Abnormal gait;Decreased cognition;Decreased balance;Decreased strength;Decreased safety awareness;Impaired vision/preception;Impaired UE functional use;Impaired tone  Rehab Potential Good  Clinical Impairments Affecting Rehab Potential decreased safety awareness; impaired vision in L eye  PT Frequency 2x / week  PT Duration 8 weeks  PT Treatment/Interventions ADLs/Self Care Home Management;Electrical Stimulation;DME Instruction;Gait training;Stair training;Functional mobility training;Therapeutic activities;Therapeutic exercise;Balance training;Neuromuscular  re-education;Vestibular;Visual/perceptual remediation/compensation  PT Next Visit Plan Continue single leg stance balance activities, static and dynamic. Gait on outside, unlevel surfaces or on indoor compliant surface with cane without AFO to challenge pt's balance and R knee GR control. Gait with eyes closed to challenge balance.    PT Home Exercise Plan see pt instructions from 11/23  Consulted and Agree with Plan of Care Patient;Family member/caregiver  Family Member Consulted wife, BBenjie Karvonen    Problem List Patient Active Problem List   Diagnosis Date Noted  . 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 Urinary retention 06/23/2015  . Acute respiratory failure ([place] 06/23/2015  . Traumatic subarachnoid bleed with LOC of 6 hours to 24 hours ([place] 06/23/2015  . Right spastic hemiparesis ([place] 06/23/2015  . Fall 06/22/2015  . Ocular trauma of right eye 06/22/2015  . TBI (traumatic brain injury) ([place] 06/16/2015  Willow Ora 08/19/2015, 4:11 PM Willow Ora, PTA, [facility] [address] [place], [place] [address] [phone] 08/21/2015, 11:26 PM   Name: Dakota [name] MRN: [id number] Date of Birth: 11-Sep-1985

## 2015-08-19 NOTE — Therapy (Signed)
[facility] [address] [employer] [place], [place], [address] Phone: [phone]   Fax:  [phone]  Occupational Therapy Treatment  Patient Details  Name: Dakota PartridgeJessie Jean MRN: [id number] Date of Birth: [birth year] No Data Recorded  Encounter Date: 08/19/2015      OT End of Session - 08/19/15 1558    Visit Number 5   Number of Visits 17   Date for OT Re-Evaluation 09/16/15   Authorization Type UHC, awaiting verification of benefits   OT Start Time 1445   OT Stop Time 1530   OT Time Calculation (min) 45 min   Activity Tolerance Patient tolerated treatment well   Behavior During Therapy [facility] for tasks assessed/performed      Past Medical History  Diagnosis Date  . Hx of renal calculi     No past surgical history on file.  There were no vitals filed for this visit.  Visit Diagnosis:  Right hemiparesis (HCC)  Spasticity  Lack of coordination      Subjective Assessment - 08/19/15 1454    Subjective  Pt reports he's doing well. States he has been wearing his splint ~4-6 hours per day with no problems. "I like my splint"   Patient is accompained by: Family member  father   Pertinent History Pt hospitalized on 06/16/15 for TBI with SAH, L eye injury (retrobulbar hemorrhage). Transferred to inpatient rehab on 10/13, where he remained until discharged home on 07/14/15.   Patient Stated Goals use RUE again   Currently in Pain? No/denies     Treatment:  Attempted to have patient use red built up foam for handwriting, but due to spasticity in hand, pt unable. Pt then attempted large peg board with pegs, and this was very challenging for patient. Therapist then had patient stand and weight bear through RUE while using LUE during therapeutic activity.   Patient then sat at high/low table and engaged in NMR of tricep flexion/extension and horizontal abduction/adduction. Encouraged patient to do this at home, educated patient's  father AND patient on correct mechanics and positioning during this.   Pt sat on edge of mat and engaged in weight bearing NMR activities, leaning on right elbow.                         OT Education - 08/19/15 1543    Education provided Yes   Education Details Educated patient on weight bearing exercises while seated EOB, tricep strengthening exercises, and wrist & forearm AAROM exercises.    Person(s) Educated Patient   Methods Explanation;Demonstration   Comprehension Verbalized understanding;Returned demonstration          OT Short Term Goals - 08/19/15 1558    OT SHORT TERM GOAL #1   Title Pt will be independent with initial HEP--check STGs 08/16/15   Time 4   Period Days   Status On-going   OT SHORT TERM GOAL #2   Title Pt will demo at least 80* R shoulder flex with minimal compensation in prep for functional reach.   Baseline 70*   Time 4   Status On-going   OT SHORT TERM GOAL #3   Title Pt will demo at least 50% gross finger ext for grasp/release of objects.   Baseline trace   Time 4   Period Weeks   Status On-going   OT SHORT TERM GOAL #4   Title Pt will be able to use RUE as gross assist/stabilizer at least 25% of  the time for ADLs.   Time 4   Period Weeks   Status On-going   OT SHORT TERM GOAL #5   Title Pt will grasp/release small cylinder object with min A in 4/5 trials.   Time 4   Period Weeks   Status On-going   OT SHORT TERM GOAL #6   Title Pt will be independent with splint wear/care prn.   Time 4   Period Weeks   Status On-going           OT Long Term Goals - 08/19/15 1558    OT LONG TERM GOAL #1   Title Pt will be independent with updated HEP.--check LTGs 09/16/15   Time 8   Period Weeks   Status On-going   OT LONG TERM GOAL #2   Title Pt will demo at least 90* R shoulder flex with minimal compensation in prep for functional reach.   Baseline 70*   Time 8   Period Weeks   Status On-going   OT LONG TERM GOAL #3    Title Pt will improve RUE functional reaching and coordination as shown by scoring at least 8 on box and blocks test.   Baseline 0   Time 8   Period Weeks   Status On-going   OT LONG TERM GOAL #4   Title Pt will use RUE as gross assist/stabilizer at least 50% of the time for ADLs.   Time 8   Period Weeks   Status On-going   OT LONG TERM GOAL #5   Title Pt will demo good safety awareness during functional tasks and per family report.   Time 8   Period Weeks   Status On-going   OT LONG TERM GOAL #6   Title Pt will verbalize understanding of cognitive and visual compensation strategies prn.   Time 8   Period Weeks   Status On-going               Plan - 08/19/15 1545    Clinical Impression Statement Pt continues to progress, but slowly. Pt continues to be limited by spasticity in RUE. Pt is motivated to functionally use RUE.    Pt will benefit from skilled therapeutic intervention in order to improve on the following deficits (Retired) Decreased cognition;Decreased mobility;Decreased strength;Impaired vision/preception;Impaired UE functional use;Pain;Decreased knowledge of use of DME;Decreased balance;Impaired tone;Decreased safety awareness;Decreased coordination;Decreased range of motion;Impaired sensation   Rehab Potential Good   OT Frequency 2x / week   OT Duration 8 weeks   OT Treatment/Interventions Self-care/ADL training;Cryotherapy;Parrafin;Electrical Stimulation;Fluidtherapy;Moist Heat;Contrast Bath;Passive range of motion;Visual/perceptual remediation/compensation;Cognitive remediation/compensation;DME and/or AE instruction;Therapeutic activities;Therapeutic exercises;Neuromuscular education;Ultrasound;Manual Therapy;Splinting;Patient/family education;Building services engineer;Therapeutic exercise   Plan NMR and weight bearing though RUE, normal movements with functional activities   Consulted and Agree with Plan of Care Patient;Family member/caregiver   Family Member  Consulted father         Problem List Patient Active Problem List   Diagnosis Date Noted  . 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 Urinary retention 06/23/2015  . Acute respiratory failure (HCC) 06/23/2015  . Traumatic subarachnoid bleed with LOC of 6 hours to 24 hours (HCC) 06/23/2015  . Right spastic hemiparesis (HCC) 06/23/2015  . Fall 06/22/2015  . Ocular trauma of right eye 06/22/2015  . TBI (traumatic brain injury) (HCC) 06/16/2015    Velora Horstman , MS, OTR/L, CLT Pager: [phone]  08/19/2015, 4:01 PM  [facility] [address] [place], [place], [address] Phone: [phone]   Fax:  [phone]  Name: Tetsuo Coppola MRN: [id number] Date of Birth: Oct 22, 1985

## 2015-08-22 ENCOUNTER — Ambulatory Visit: Payer: 59 | Admitting: Physical Therapy

## 2015-08-22 ENCOUNTER — Ambulatory Visit: Payer: 59 | Admitting: Occupational Therapy

## 2015-08-22 DIAGNOSIS — G8191 Hemiplegia, unspecified affecting right dominant side: Secondary | ICD-10-CM | POA: Diagnosis not present

## 2015-08-22 DIAGNOSIS — R279 Unspecified lack of coordination: Secondary | ICD-10-CM

## 2015-08-22 DIAGNOSIS — R41840 Attention and concentration deficit: Secondary | ICD-10-CM

## 2015-08-22 NOTE — Therapy (Signed)
[facility] [address] [place], [place], [address] Phone: [phone]   Fax:  [phone]  Occupational Therapy Treatment  Patient Details  Name: Dakota [name] MRN: [id number] Date of Birth: 03/16/1986 No Data Recorded  Encounter Date: 08/22/2015      OT End of Session - 08/22/15 1539    Visit Number 6   Number of Visits 17   Date for OT Re-Evaluation 09/16/15   Authorization Type UHC, awaiting verification of benefits   OT Start Time 1535   OT Stop Time 1615   OT Time Calculation (min) 40 min   Activity Tolerance Patient tolerated treatment well   Behavior During Therapy [facility] for tasks assessed/performed      Past Medical History  Diagnosis Date  . Hx of renal calculi     No past surgical history on file.  There were no vitals filed for this visit.  Visit Diagnosis:  Right hemiparesis ([place])  Lack of coordination  Inattention      Subjective Assessment - 08/22/15 1538    Subjective  "I can do more moving in"  Pt reports that he can see clearly out of L eye when he lifts eyelid, but that he sees blurriness from eyelashes sometimes.  (pt is able to open eye much more than on initial eval).   Pertinent History Pt hospitalized on 06/16/15 for TBI with SAH, L eye injury (retrobulbar hemorrhage). Transferred to inpatient rehab on 10/13, where he remained until discharged home on 07/14/15.   Patient Stated Goals use RUE again   Currently in Pain? No/denies                      OT Treatments/Exercises (OP) - 08/22/15 0001    Neurological Re-education Exercises   Shoulder Flexion AAROM;Both;Seated  cane  And then in standing with table slides with min cueing for compensation + mirror   Forearm Supination AROM;Right  isolated, min cues   Wrist Extension AROM;Right  isolated with min cueing   Finger Extension stretch followed by attempts for ext with min-mod cueing for relaxation   Other  Exercises 1 Focus/cueing for normal movement patterns for lifting arm to lap and then table with min-mod cueing during session and to avoid using LUE assist for incr R side attention.   Other Grasp and Release Exercises  Grasping cylinder pegs to remove from pegboard (largest) and releasing in bowl in ER/low-range abduction with min facilitation, mod cues (but decr with repetition).  Then repeated with small cylinder objects (cones) but had to grasp by coming down from the top due to decr finger ext.  Grasping medium cylinder object with min-mod A for grasp/release.      Checked STGs and discussed progress--see goals section.          OT Education - 08/22/15 1631    Education Details What spasticity is and how it affects movement   Person(s) Educated Patient   Methods Explanation   Comprehension Verbalized understanding          OT Short Term Goals - 08/22/15 1540    OT SHORT TERM GOAL #1   Title Pt will be independent with initial HEP--check STGs 08/16/15   Time 4   Period Days   Status On-going   OT SHORT TERM GOAL #2   Title Pt will demo at least 80* R shoulder flex with minimal compensation in prep for functional reach.   Baseline 70*   Time 4  Status On-going  08/22/15  60-70* with mod compensation   OT SHORT TERM GOAL #3   Title Pt will demo at least 50% gross finger ext for grasp/release of objects.   Baseline trace   Time 4   Period Weeks   Status On-going  08/22/15  25-50% varies with spasticity   OT SHORT TERM GOAL #4   Title Pt will be able to use RUE as gross assist/stabilizer at least 25% of the time for ADLs.   Time 4   Period Weeks   Status Achieved  08/22/15:  approx this level per pt report   OT SHORT TERM GOAL #5   Title Pt will grasp/release small cylinder object with min A in 4/5 trials.   Time 4   Period Weeks   Status Achieved  08/22/15  met at approx this level   OT SHORT TERM GOAL #6   Title Pt will be independent with splint wear/care  prn.   Time 4   Period Weeks   Status On-going  08/22/15 partially met only 4-6 hrs           OT Long Term Goals - 08/19/15 1558    OT LONG TERM GOAL #1   Title Pt will be independent with updated HEP.--check LTGs 09/16/15   Time 8   Period Weeks   Status On-going   OT LONG TERM GOAL #2   Title Pt will demo at least 90* R shoulder flex with minimal compensation in prep for functional reach.   Baseline 70*   Time 8   Period Weeks   Status On-going   OT LONG TERM GOAL #3   Title Pt will improve RUE functional reaching and coordination as shown by scoring at least 8 on box and blocks test.   Baseline 0   Time 8   Period Weeks   Status On-going   OT LONG TERM GOAL #4   Title Pt will use RUE as gross assist/stabilizer at least 50% of the time for ADLs.   Time 8   Period Weeks   Status On-going   OT LONG TERM GOAL #5   Title Pt will demo good safety awareness during functional tasks and per family report.   Time 8   Period Weeks   Status On-going   OT LONG TERM GOAL #6   Title Pt will verbalize understanding of cognitive and visual compensation strategies prn.   Time 8   Period Weeks   Status On-going               Plan - 08/22/15 1624    Clinical Impression Statement Pt continues to progress with functional movements, but spasticity and compensation patterns/R inattention are barriers to RUE functional use.  Pt also not seen frequency due to schedule conflicts.   Plan review/update HEP (add functional movements as able), neuro re-ed   Consulted and Agree with Plan of Care Patient        Problem List Patient Active Problem List   Diagnosis Date Noted  . 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 Urinary retention 06/23/2015  . Acute respiratory failure ([place]) 06/23/2015  . Traumatic subarachnoid bleed with LOC of 6 hours to 24 hours ([place]) 06/23/2015  . Right spastic hemiparesis ([place]) 06/23/2015  . Fall 06/22/2015  . Ocular trauma of right eye 06/22/2015  . TBI  (traumatic brain injury) [facility]) 06/16/2015    [facility] 08/22/2015, 5:39 PM  [facility] [address] [place],  [place], [address] Phone: [phone]   Fax:  [phone]  Name: Kalim Kissel MRN: [id number] Date of Birth: 1986/03/15  Vianne Bulls, OTR/L 08/22/2015 5:40 PM

## 2015-08-26 ENCOUNTER — Ambulatory Visit: Payer: 59 | Admitting: Physical Therapy

## 2015-08-26 ENCOUNTER — Ambulatory Visit: Payer: 59 | Admitting: Occupational Therapy

## 2015-08-26 DIAGNOSIS — R41840 Attention and concentration deficit: Secondary | ICD-10-CM

## 2015-08-26 DIAGNOSIS — R4189 Other symptoms and signs involving cognitive functions and awareness: Secondary | ICD-10-CM

## 2015-08-26 DIAGNOSIS — G8191 Hemiplegia, unspecified affecting right dominant side: Secondary | ICD-10-CM | POA: Diagnosis not present

## 2015-08-26 DIAGNOSIS — R279 Unspecified lack of coordination: Secondary | ICD-10-CM

## 2015-08-26 DIAGNOSIS — R41842 Visuospatial deficit: Secondary | ICD-10-CM

## 2015-08-26 DIAGNOSIS — R269 Unspecified abnormalities of gait and mobility: Secondary | ICD-10-CM

## 2015-08-26 NOTE — Therapy (Signed)
[facility] [address] [place], [place], [address] Phone: [phone]   Fax:  [phone]  Occupational Therapy Treatment  Patient Details  Name: Dakota [name] MRN: [id number] Date of Birth: 01-22-1986 No Data Recorded  Encounter Date: 08/26/2015      OT End of Session - 08/26/15 1130    Visit Number 7   Number of Visits 17   Date for OT Re-Evaluation 09/16/15   Authorization Type UHC, awaiting verification of benefits   OT Start Time 1107   OT Stop Time 1145   OT Time Calculation (min) 38 min   Activity Tolerance Patient tolerated treatment well   Behavior During Therapy [facility] for tasks assessed/performed      Past Medical History  Diagnosis Date  . Hx of renal calculi     No past surgical history on file.  There were no vitals filed for this visit.  Visit Diagnosis:  Visuospatial deficit  Cognitive deficits  Right hemiparesis (HCC)  Lack of coordination  Inattention      Subjective Assessment - 08/26/15 1117    Patient is accompained by: Family member   Pertinent History Pt hospitalized on 06/16/15 for TBI with SAH, L eye injury (retrobulbar hemorrhage). Transferred to inpatient rehab on 10/13, where he remained until discharged home on 07/14/15.   Patient Stated Goals use RUE again   Currently in Pain? Yes   Pain Score 4    Pain Location Arm   Pain Orientation Right   Pain Descriptors / Indicators Aching   Pain Type Acute pain   Pain Onset More than a month ago   Pain Frequency Intermittent   Aggravating Factors  malpositioning   Pain Relieving Factors repositioning       Treatment: Supine rolling onto right side then back onto back for weightbearing supine shoulder flexion and chest press with PVC pipe frame, mod facilitation for normal movements Seated weightbearing through elbow while performing lateral trunk extension and reaching for ceiling with LUE. NMES 50 pps 250 pw 10 secs  cycle, intensity 24 to wrist and finger extensors x 10 mins. Functional grasp / release of cylindrical objects with RUE, mod/ max facilitation and cueing to encourage graded grasp. Flipping large playing cards with RUE, max facilitation                         OT Short Term Goals - 08/22/15 1540    OT SHORT TERM GOAL #1   Title Pt will be independent with initial HEP--check STGs 08/16/15   Time 4   Period Days   Status On-going   OT SHORT TERM GOAL #2   Title Pt will demo at least 80* R shoulder flex with minimal compensation in prep for functional reach.   Baseline 70*   Time 4   Status On-going  08/22/15  60-70* with mod compensation   OT SHORT TERM GOAL #3   Title Pt will demo at least 50% gross finger ext for grasp/release of objects.   Baseline trace   Time 4   Period Weeks   Status On-going  08/22/15  25-50% varies with spasticity   OT SHORT TERM GOAL #4   Title Pt will be able to use RUE as gross assist/stabilizer at least 25% of the time for ADLs.   Time 4   Period Weeks   Status Achieved  08/22/15:  approx this level per pt report   OT SHORT TERM GOAL #5  Title Pt will grasp/release small cylinder object with min A in 4/5 trials.   Time 4   Period Weeks   Status Achieved  08/22/15  met at approx this level   OT SHORT TERM GOAL #6   Title Pt will be independent with splint wear/care prn.   Time 4   Period Weeks   Status On-going  08/22/15 partially met only 4-6 hrs           OT Long Term Goals - 08/19/15 1558    OT LONG TERM GOAL #1   Title Pt will be independent with updated HEP.--check LTGs 09/16/15   Time 8   Period Weeks   Status On-going   OT LONG TERM GOAL #2   Title Pt will demo at least 90* R shoulder flex with minimal compensation in prep for functional reach.   Baseline 70*   Time 8   Period Weeks   Status On-going   OT LONG TERM GOAL #3   Title Pt will improve RUE functional reaching and coordination as shown by scoring  at least 8 on box and blocks test.   Baseline 0   Time 8   Period Weeks   Status On-going   OT LONG TERM GOAL #4   Title Pt will use RUE as gross assist/stabilizer at least 50% of the time for ADLs.   Time 8   Period Weeks   Status On-going   OT LONG TERM GOAL #5   Title Pt will demo good safety awareness during functional tasks and per family report.   Time 8   Period Weeks   Status On-going   OT LONG TERM GOAL #6   Title Pt will verbalize understanding of cognitive and visual compensation strategies prn.   Time 8   Period Weeks   Status On-going               Plan - 08/26/15 1426    Clinical Impression Statement Pt is progressing towards goals yet progress has been impeded by spasticity and right inattention.   Rehab Potential Good   OT Frequency 2x / week   OT Duration 8 weeks   Plan progress HEP (consider functional grasp release of plastic cups)NMR   Consulted and Agree with Plan of Care Patient;Family member/caregiver   Family Member Consulted father         Problem List Patient Active Problem List   Diagnosis Date Noted  . 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 Urinary retention 06/23/2015  . Acute respiratory failure ([place]) 06/23/2015  . Traumatic subarachnoid bleed with LOC of 6 hours to 24 hours ([place]) 06/23/2015  . Right spastic hemiparesis ([place]) 06/23/2015  . Fall 06/22/2015  . Ocular trauma of right eye 06/22/2015  . TBI (traumatic brain injury) ([place]) 06/16/2015    RINE,KATHRYN 08/26/2015, 2:28 PM Theone Murdoch, OTR/L [phone] Phone: [phone] 2:28 PM 12/16/2016[facility] [address] [place], [place], [address] Phone: [phone]   Fax:  [phone]  Name: Dakota [name] MRN: [id number] Date of Birth: 01/25/1986

## 2015-08-27 NOTE — Therapy (Signed)
[facility] [address] [place], [place], [address] Phone: [phone]   Fax:  [phone]  Physical Therapy Treatment  Patient Details  Name: Dakota [name] MRN: [id number] Date of Birth: [birth year] Referring Provider: Dwana Melena, MD  Encounter Date: 08/26/2015      PT End of Session - 08/27/15 1013    Visit Number 8   Number of Visits 17   Date for PT Re-Evaluation 09/16/15   Authorization Type UHC   PT Start Time 1152   PT Stop Time 1231   PT Time Calculation (min) 39 min   Activity Tolerance Patient tolerated treatment well   Behavior During Therapy [facility] for tasks assessed/performed      Past Medical History  Diagnosis Date  . Hx of renal calculi     No past surgical history on file.  There were no vitals filed for this visit.  Visit Diagnosis:  Abnormality of gait  Right hemiparesis ([place])      Subjective Assessment - 08/26/15 1156    Subjective Pt denies pain and reports no significant changes since last session. Wears AFO and uses cane outside home. Inside home, wears brace only when pt/wife note patient hyperextending knee (as directed by PT in previous session).   Patient is accompained by: Family member  father   Pertinent History SAH (06/16/15); L eye contusion, retrobulbar hemorrhage s/p canthotomy   Patient Stated Goals "Just to walk better; walk without the cane or the brace and to use the arm again."   Currently in Pain? Yes   Pain Score 3    Pain Location Arm   Pain Orientation Right   Pain Descriptors / Indicators Aching   Pain Type Acute pain   Pain Onset More than a month ago   Pain Frequency Intermittent   Aggravating Factors  malpositioning   Pain Relieving Factors repositioning   Multiple Pain Sites No                         [facility] Adult PT Treatment/Exercise - 08/27/15 0001    Ambulation/Gait   Ambulation/Gait Yes   Ambulation/Gait Assistance 6:  Modified independent (Device/Increase time);5: Supervision   Ambulation/Gait Assistance Details Pt performed the following gait training with NMES at R hamstrings to control R genu recurvatum: gait training on treadmill x1 minute with no incline with RUE support; x2 minutes without incline without UE support with mirror for visual feedback of R knee position and verbal/tactile cueing for increased attention to R knee position (to prevent recurvatum; x2 minutes on 2% incline without UE support with cueing as described. During final minute of gait training on inclined treadmill at speed of .6, pt consistently controlled R GR with use of mirror only. Transitioned to NMR interventions (see below) with NMES. Removed NMES and performed gait x650' over unlevel, paved and grass surfaces with no AFO, no SPC, and no heel wedge with consistent control of R recurvatum over paved surface and inconsistent control over grass surfaces.   Ambulation Distance (Feet) 800 Feet  [id number]' outdoors; x230' indoors   Assistive device None;Other (Comment)  No AFO; no heel wedge   Gait Pattern Step-through pattern;Decreased arm swing - right;Decreased weight shift to right;Abducted- right;Right genu recurvatum  no AD, no AFO, no heel wedge   Ambulation Surface Level;Unlevel;Indoor;Outdoor;Paved;Gravel;Grass   Gait Comments During gait training without AD and without AFO, noted consistent R ankle DF during RLE advancement and no episodes of R toe  drag/catch.   Neuro Re-ed    Neuro Re-ed Details  With NMES at R hamstrings to decrease R genu recurvatum, performed the following dynamic single limb stance activities for increased R knee control: using LLE to turn over then reposition cones into upright position 3 x14 reps with cueing initially required at R hamstrings for attention to knee control, HS activation. Transtioned receiving then returning rolling soccer ball pass x20 reps with LLE then x20 reps with RLE; then received/returned  soccer pass with LLE only (for RLE single limb stance) x20 reps with lateral stepping between trials.                PT Education - 08/27/15 [phone]    Education provided Yes   Education Details Pt now safe to ambulated in home and community without R AFO as long as pt does not exhibit R toe drag/catch or R knee hyperextension. Recommending pt keep heel wedge in R shoe at all times and to use [facility] for outdoor mobility.   Person(s) Educated Patient;Parent(s)  father   Methods Explanation   Comprehension Verbalized understanding          PT Short Term Goals - 08/17/15 1632    PT SHORT TERM GOAL #1   Title Pt will perform initial HEP with mod I using paper handout to maximize functional gains made in PT. Target date: 08/15/15   Baseline Met 11/23.   Status Achieved   PT SHORT TERM GOAL #2   Title Pt will negotiate 9 stairs with single rail and mod I (no cueing for safety awareness) to indicate safety using primary home entrance. Target date: 08/15/15   Baseline Achieved: 08/17/15. Pt negotiated 12 stairs mod I.   Status Achieved   PT SHORT TERM GOAL #3   Title Pt will increase gait velocity from 1.97 ft/sec to 2.27 ft/sec to indicate increase efficiency of ambulation. Target date: 08/15/15   Baseline Achieved: 08/17/15. Pt achieve 2.80f/sec gait speed.   Status Achieved   PT SHORT TERM GOAL #4   Title Pt will independently ambulate 200' without AD using AFO to progress toward PLOF and pt-stated goal of independent ambulation. Target date: 08/15/15   Baseline Achieved: 08/17/15. Pt I with ambulating 2071fwithout AD with AFO on indoor, level surfaces.   Status Achieved   PT SHORT TERM GOAL #5   Title Pt will ambulate 500' over unlevel, paved surfaces with mod I using LRAD to indicate increased safety with limited community mobility. Target date: 08/15/15   Baseline Assessed: 08/17/15. Pt supervision on unlevel, grass. Pt mod I on outdoor,  level, paved surface.    Status Partially Met    PT SHORT TERM GOAL #6   Title Complete DGI to assess dynamic gait stability. Target date: 08/15/15   Baseline 11/18: DGI score = 13/24   Status Achieved   PT SHORT TERM GOAL #7   Title                PT Long Term Goals - 08/17/15 1648    PT LONG TERM GOAL #1   Title Pt will increase gait velocity from 1.97 ft/sec to > 2.62 ft/sec to indicate status of community ambulator. Target date: 09/12/15   PT LONG TERM GOAL #2   Title Pt will score >/= 20/24 on DGI to indicate decreased risk of falling. Target date: 09/12/15   PT LONG TERM GOAL #3   Title Pt will independently negotiate 9 stairs with single rail and reciprocal pattern  to indicate pt independence using primary home entrance. Target date: 09/12/15   PT LONG TERM GOAL #4   Title Pt will independently ambulate 200' over level, indoor surfaces without assistive device or AFO to progress toward PLOF as independent with household ambulation. Target date: 09/12/15   PT LONG TERM GOAL #5   Title Pt will ambulate >1,000' over outdoor, paved surfaces with mod I using LRAD to indicate increased independence with commnuity mobility. Target date: 09/12/15   PT LONG TERM GOAL #6   Title Pt will negotiate standard ramp and curb step with mod I using LRAD to indicate safety traversing community obstacles. Target date: 09/12/15               Plan - 08/27/15 1014    Clinical Impression Statement Session focused on increasing control of R knee during dynamic single limb stance activities and gait over varying surfaces. Pt now safe to ambulate without R AFO in community environment. Recommending continued use of heel wedge in R shoe and to use SPC for outdoor mobility. Pt/father verbalized understanding.   Pt will benefit from skilled therapeutic intervention in order to improve on the following deficits Abnormal gait;Decreased cognition;Decreased balance;Decreased strength;Decreased safety awareness;Impaired vision/preception;Impaired UE functional  use;Impaired tone   Rehab Potential Good   Clinical Impairments Affecting Rehab Potential decreased safety awareness; impaired vision in L eye   PT Frequency 2x / week   PT Duration 8 weeks   PT Treatment/Interventions ADLs/Self Care Home Management;Electrical Stimulation;DME Instruction;Gait training;Stair training;Functional mobility training;Therapeutic activities;Therapeutic exercise;Balance training;Neuromuscular re-education;Vestibular;Visual/perceptual remediation/compensation   PT Next Visit Plan Provide more current HEP. Continue single leg stance balance activities, static and dynamic. Gait on outside, unlevel surfaces or on indoor compliant surface without SPC.   Consulted and Agree with Plan of Care Patient;Family member/caregiver   Family Member Consulted father        Problem List Patient Active Problem List   Diagnosis Date Noted  . 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 Urinary retention 06/23/2015  . Acute respiratory failure ([place]) 06/23/2015  . Traumatic subarachnoid bleed with LOC of 6 hours to 24 hours ([place]) 06/23/2015  . 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 Fall 06/22/2015  . Ocular trauma of right eye 06/22/2015  . TBI (traumatic brain injury) ([place]) 06/16/2015    Billie Ruddy, PT, [facility] [address] [place], [place], [address] Phone: [phone]   Fax:  [phone] 08/27/2015, 10:20 AM   Name: Dakota [name] MRN: [id number] Date of Birth: 10/03/1985

## 2015-08-29 ENCOUNTER — Ambulatory Visit: Payer: 59 | Admitting: Physical Therapy

## 2015-08-29 ENCOUNTER — Ambulatory Visit: Payer: 59 | Admitting: Occupational Therapy

## 2015-08-29 DIAGNOSIS — R41840 Attention and concentration deficit: Secondary | ICD-10-CM

## 2015-08-29 DIAGNOSIS — R2681 Unsteadiness on feet: Secondary | ICD-10-CM

## 2015-08-29 DIAGNOSIS — R52 Pain, unspecified: Secondary | ICD-10-CM

## 2015-08-29 DIAGNOSIS — R252 Cramp and spasm: Secondary | ICD-10-CM

## 2015-08-29 DIAGNOSIS — G8191 Hemiplegia, unspecified affecting right dominant side: Secondary | ICD-10-CM

## 2015-08-29 DIAGNOSIS — R279 Unspecified lack of coordination: Secondary | ICD-10-CM

## 2015-08-29 DIAGNOSIS — R269 Unspecified abnormalities of gait and mobility: Secondary | ICD-10-CM

## 2015-08-29 NOTE — Therapy (Signed)
[facility] [address] [place], [place], [address] Phone: [phone]   Fax:  [phone]  Occupational Therapy Treatment  Patient Details  Name: Dakota [name] MRN: [id number] Date of Birth: 1986-04-24 No Data Recorded  Encounter Date: 08/29/2015      OT End of Session - 08/29/15 1633    Visit Number 8   Number of Visits 17   Date for OT Re-Evaluation 09/16/15   Authorization Type UHC, awaiting verification of benefits   Authorization Time Period wk 5/8   OT Start Time 1533   OT Stop Time 1615   OT Time Calculation (min) 42 min   Activity Tolerance Patient tolerated treatment well   Behavior During Therapy [facility] for tasks assessed/performed      Past Medical History  Diagnosis Date  . Hx of renal calculi     No past surgical history on file.  There were no vitals filed for this visit.  Visit Diagnosis:  Right hemiparesis ([place])  Lack of coordination  Inattention  Spasticity  Pain      Subjective Assessment - 08/29/15 1627    Subjective  Pt reports that his arm has been hurting the past 2 days and that he has been using a sling.  However, wife reports that pt has been attempting to use RUE more.   Patient is accompained by: Family member  wife   Pertinent History Pt hospitalized on 06/16/15 for TBI with SAH, L eye injury (retrobulbar hemorrhage). Transferred to inpatient rehab on 10/13, where he remained until discharged home on 07/14/15.   Patient Stated Goals use RUE again   Currently in Pain? No/denies  during session                      OT Treatments/Exercises (OP) - 08/29/15 0001    Neurological Re-education Exercises   Shoulder Flexion AAROM;Both;Seated  with PVC frame and min-mod facilitation/cues for compensatio   Other Exercises 1 focus on normal movement patterns for functional movements:  grasp/release of various objects (various size cylinder objects and 1-inch  blocks), lifting hand to/from table and lap without compensation, low range functional reaching.  Pt instructed to stretch hand frequently in finger ext and therapist facilitated this prior to grasp objects as needed with min facilitation for grasp intermittently and min-mod facilitation/cues for shoulder hike/IR.   Other Exercises 2 Table slides for shoulder flex/elbow ext and horizontal abduction with min cueing.    Seated with weight on hand with body on arm movements with min cues/facilitation   Other Weight-Bearing Exercises 1 Wt. bearing on mat in modified quadraped for decr tone, neuro re-ed, incr tricep activation.  Pt demo decr tone after weight bearing.                OT Education - 08/29/15 1628    Education Details Updated HEP (see pt instructions); importance of grading movement to decr tone; Recommended against sling as may contribute to spasticity/RUE inattention/pain;  reviewed proper positioning during movement/rest/sleep and importance of avoiding compensation patterns   Person(s) Educated Patient;Spouse   Methods Explanation;Demonstration   Comprehension Verbalized understanding;Returned demonstration;Tactile cues required;Verbal cues required;Need further instruction  cueing for normal movement patterns          OT Short Term Goals - 08/22/15 1540    OT SHORT TERM GOAL #1   Title Pt will be independent with initial HEP--check STGs 08/16/15   Time 4   Period Days   Status On-going  OT SHORT TERM GOAL #2   Title Pt will demo at least 80* R shoulder flex with minimal compensation in prep for functional reach.   Baseline 70*   Time 4   Status On-going  08/22/15  60-70* with mod compensation   OT SHORT TERM GOAL #3   Title Pt will demo at least 50% gross finger ext for grasp/release of objects.   Baseline trace   Time 4   Period Weeks   Status On-going  08/22/15  25-50% varies with spasticity   OT SHORT TERM GOAL #4   Title Pt will be able to use RUE as  gross assist/stabilizer at least 25% of the time for ADLs.   Time 4   Period Weeks   Status Achieved  08/22/15:  approx this level per pt report   OT SHORT TERM GOAL #5   Title Pt will grasp/release small cylinder object with min A in 4/5 trials.   Time 4   Period Weeks   Status Achieved  08/22/15  met at approx this level   OT SHORT TERM GOAL #6   Title Pt will be independent with splint wear/care prn.   Time 4   Period Weeks   Status On-going  08/22/15 partially met only 4-6 hrs           OT Long Term Goals - 08/19/15 1558    OT LONG TERM GOAL #1   Title Pt will be independent with updated HEP.--check LTGs 09/16/15   Time 8   Period Weeks   Status On-going   OT LONG TERM GOAL #2   Title Pt will demo at least 90* R shoulder flex with minimal compensation in prep for functional reach.   Baseline 70*   Time 8   Period Weeks   Status On-going   OT LONG TERM GOAL #3   Title Pt will improve RUE functional reaching and coordination as shown by scoring at least 8 on box and blocks test.   Baseline 0   Time 8   Period Weeks   Status On-going   OT LONG TERM GOAL #4   Title Pt will use RUE as gross assist/stabilizer at least 50% of the time for ADLs.   Time 8   Period Weeks   Status On-going   OT LONG TERM GOAL #5   Title Pt will demo good safety awareness during functional tasks and per family report.   Time 8   Period Weeks   Status On-going   OT LONG TERM GOAL #6   Title Pt will verbalize understanding of cognitive and visual compensation strategies prn.   Time 8   Period Weeks   Status On-going               Plan - 08/29/15 1638    Clinical Impression Statement Pt is progressing as he now demo incr ability to intentionally relax fingers to release objects.  Pt continues to demo significant compensation patterns if not cued, which may be contributing to pain.   Plan neuro re-ed   Consulted and Agree with Plan of Care Patient;Family member/caregiver    Family Member Consulted wife        Problem List Patient Active Problem List   Diagnosis Date Noted  . 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 Urinary retention 06/23/2015  . Acute respiratory failure ([place]) 06/23/2015  . Traumatic subarachnoid bleed with LOC of 6 hours to 24 hours ([place]) 06/23/2015  . Right spastic hemiparesis ([place]) 06/23/2015  .  Fall 06/22/2015  . Ocular trauma of right eye 06/22/2015  . TBI (traumatic brain injury) ([place]) 06/16/2015    [facility] 08/29/2015, 5:03 PM  [facility] [address] [place], [place], [address] Phone: [phone]   Fax:  [phone]  Name: Dakota [name] MRN: [id number] Date of Birth: 02/18/1986  Vianne Bulls, OTR/L 08/29/2015 5:04 PM

## 2015-08-29 NOTE — Therapy (Signed)
[facility] [address] [place], [place], [address] Phone: [phone]   Fax:  [phone]  Physical Therapy Treatment  Patient Details  Name: Dakota [name] MRN: [id number] Date of Birth: [birth year] Referring Provider: Dwana Melena, MD  Encounter Date: 08/29/2015      PT End of Session - 08/29/15 1912    Visit Number 9   Number of Visits 17   Date for PT Re-Evaluation 09/16/15   Authorization Type UHC   PT Start Time 1448   PT Stop Time 1530   PT Time Calculation (min) 42 min   Activity Tolerance Patient tolerated treatment well   Behavior During Therapy [facility] for tasks assessed/performed      Past Medical History  Diagnosis Date  . Hx of renal calculi     No past surgical history on file.  There were no vitals filed for this visit.  Visit Diagnosis:  Abnormality of gait  Unsteadiness  Right hemiparesis ([facility])      Subjective Assessment - 08/29/15 1450    Subjective Pt/wife report pt is doing well without use of R AFO. Wife does report occasional R knee hyperextension "mostly in the morning." Pt uses cane only when walking longer distances outside home. Pt reports pain on outside of R hip at times; none present today at beginning of session. Pt also reports feeling as though he has "no endurance".   Patient is accompained by: Family member  wife, Benjie Karvonen   Pertinent History SAH (06/16/15); L eye contusion, retrobulbar hemorrhage s/p canthotomy   Patient Stated Goals "Just to walk better; walk without the cane or the brace and to use the arm again."   Currently in Pain? No/denies            [facility] PT Assessment - 08/29/15 0001    Strength   Overall Strength Deficits   Overall Strength Comments 3+/5 R hip extension (gluteus maximus isolated); R hip ABD 4-/5   6 minute walk test results    Aerobic Endurance Distance Walked 1022   Endurance additional comments R Trendelenburg onset after initial 45  seconds of 6MWT.                     [facility] Adult PT Treatment/Exercise - 08/29/15 0001    Ambulation/Gait   Ambulation/Gait Yes   Ambulation/Gait Assistance 6: Modified independent (Device/Increase time)   Ambulation/Gait Assistance Details 6MWT   Ambulation Distance (Feet) 1022 Feet   Assistive device None   Gait Pattern Step-through pattern;Decreased arm swing - right;Decreased weight shift to right;Abducted- right;Trendelenburg  no AD, no AFO, no heel wedge   Ambulation Surface Level;Indoor   Gait Comments Pt performed the following tall kneeling activities without UE support to increase R stance stability during gait: lateral stepping 3 steps x20 reps per side; forward/retro stepping 10 x3 reps. Activity involved pt performed L anterolateral reaching to receive item from one daughter, then performed LUE across midline to place item in other daughter's hand.   Exercises   Exercises Other Exercises   Other Exercises  With verbal instruction and demo cueing from this PT, pt effectively performed R clamshells and R single leg bridging for increased R stance stability during gait. See Pt Instructions for details on exercises, reps, sets, and frequency.                PT Education - 08/29/15 1904    Education provided Yes   Education Details HEP: added hip strengthening and  walking program; removed ankle strengthening/stretchning.    Person(s) Educated Patient;Spouse   Methods Explanation;Demonstration;Tactile cues;Verbal cues;Handout   Comprehension Verbalized understanding;Returned demonstration          PT Short Term Goals - 08/29/15 1919    PT SHORT TERM GOAL #1   Title Pt will perform initial HEP with mod I using paper handout to maximize functional gains made in PT. Target date: 08/15/15   Baseline Met 11/23.   Status Achieved   PT SHORT TERM GOAL #2   Title Pt will negotiate 9 stairs with single rail and mod I (no cueing for safety awareness) to indicate  safety using primary home entrance. Target date: 08/15/15   Baseline Achieved: 08/17/15. Pt negotiated 12 stairs mod I.   Status Achieved   PT SHORT TERM GOAL #3   Title Pt will increase gait velocity from 1.97 ft/sec to 2.27 ft/sec to indicate increase efficiency of ambulation. Target date: 08/15/15   Baseline Achieved: 08/17/15. Pt achieve 2.56f/sec gait speed.   Status Achieved   PT SHORT TERM GOAL #4   Title Pt will independently ambulate 200' without AD using AFO to progress toward PLOF and pt-stated goal of independent ambulation. Target date: 08/15/15   Baseline Achieved: 08/17/15. Pt I with ambulating 2017fwithout AD with AFO on indoor, level surfaces.   Status Achieved   PT SHORT TERM GOAL #5   Title Pt will ambulate 500' over unlevel, paved surfaces with mod I using LRAD to indicate increased safety with limited community mobility. Target date: 08/15/15   Baseline Assessed: 08/17/15. Pt supervision on unlevel, grass. Pt mod I on outdoor,  level, paved surface.    Status Partially Met   PT SHORT TERM GOAL #6   Title Complete DGI to assess dynamic gait stability. Target date: 08/15/15   Baseline 11/18: DGI score = 13/24   Status Achieved   PT SHORT TERM GOAL #7   Title Pt will improve 6MWT distance from 1,022' to 1,079' to indicate improved functional endurance.Target date: 09/12/15   Status New           PT Long Term Goals - 08/17/15 1648    PT LONG TERM GOAL #1   Title Pt will increase gait velocity from 1.97 ft/sec to > 2.62 ft/sec to indicate status of community ambulator. Target date: 09/12/15   PT LONG TERM GOAL #2   Title Pt will score >/= 20/24 on DGI to indicate decreased risk of falling. Target date: 09/12/15   PT LONG TERM GOAL #3   Title Pt will independently negotiate 9 stairs with single rail and reciprocal pattern to indicate pt independence using primary home entrance. Target date: 09/12/15   PT LONG TERM GOAL #4   Title Pt will independently ambulate 200' over level,  indoor surfaces without assistive device or AFO to progress toward PLOF as independent with household ambulation. Target date: 09/12/15   PT LONG TERM GOAL #5   Title Pt will ambulate >1,000' over outdoor, paved surfaces with mod I using LRAD to indicate increased independence with commnuity mobility. Target date: 09/12/15   PT LONG TERM GOAL #6   Title Pt will negotiate standard ramp and curb step with mod I using LRAD to indicate safety traversing community obstacles. Target date: 09/12/15               Plan - 08/29/15 1913    Clinical Impression Statement Session focused on increasing R hip stability during gait training, as pt exhibited significant R  Trendelenburg during majority of 6MWT. Added STG for 6MWT and initiated walking program.    Pt will benefit from skilled therapeutic intervention in order to improve on the following deficits Abnormal gait;Decreased cognition;Decreased balance;Decreased strength;Decreased safety awareness;Impaired vision/preception;Impaired UE functional use;Impaired tone   Rehab Potential Good   Clinical Impairments Affecting Rehab Potential decreased safety awareness; impaired vision in L eye   PT Frequency 2x / week   PT Duration 8 weeks   PT Treatment/Interventions ADLs/Self Care Home Management;Electrical Stimulation;DME Instruction;Gait training;Stair training;Functional mobility training;Therapeutic activities;Therapeutic exercise;Balance training;Neuromuscular re-education;Vestibular;Visual/perceptual remediation/compensation   PT Next Visit Plan ** Ask about insurance coverage (as well as if coverage will change in 2017) and schedule for PT/OT. Continue R hip strengthening for R stance stability.   PT Home Exercise Plan see pt instructions from 12/19   Consulted and Agree with Plan of Care Patient;Family member/caregiver   Family Member Consulted wife, Benjie Karvonen        Problem List Patient Active Problem List   Diagnosis Date Noted  . Reactive  depression 08/09/2015  . Urinary retention 06/23/2015  . Acute respiratory failure ([place]) 06/23/2015  . Traumatic subarachnoid bleed with LOC of 6 hours to 24 hours ([place]) 06/23/2015  . Right spastic hemiparesis ([place]) 06/23/2015  . Fall 06/22/2015  . Ocular trauma of right eye 06/22/2015  . TBI (traumatic brain injury) ([place]) 06/16/2015    Billie Ruddy, PT, [facility] [address] [place], [place], [address] Phone: [phone]   Fax:  [phone] 08/29/2015, 7:23 PM   Name: Dakota [name] MRN: [id number] Date of Birth: 11-30-1985

## 2015-08-29 NOTE — Patient Instructions (Addendum)
Abduction: Clam (Eccentric) - Side-Lying - RIGHT    Lie on LEFT side with knees bent. Lift top (RIGHT) knee, keeping feet together. Keep trunk steady. Slowly lower for 3-5 seconds. ** Try not to let your hips move when you raise your leg up. _8_ reps per set, _3__ sets per day.   Bridging (Single Leg) - RIGHT    Lie on back with feet shoulder width apart and left leg straight. Use your RIGHT leg to lift hips toward the ceiling while keeping leg straight. Hold __1-2__ seconds. Repeat __12__ times. Do _3___ sessions per day.  Walking Program:  Begin walking for exercise for 7 minutes, 1 times/day, 5 days/week.   Progress your walking program by adding 1 minute to your routine each week, as tolerated. Be sure to wear good walking shoes, use your cane, walk in a safe environment and only progress to your tolerance.

## 2015-08-29 NOTE — Patient Instructions (Signed)
   -   Pick up blocks and put in large bowl (place bowl out to right side)  - Wipe table by extending elbow and wipe out to right side.  - Practice picking up empty plastic bottles of different sizes.  - Place hand flat on table and push down straighten elbow

## 2015-08-31 ENCOUNTER — Encounter: Payer: 59 | Admitting: Occupational Therapy

## 2015-08-31 ENCOUNTER — Ambulatory Visit: Payer: 59 | Admitting: Physical Therapy

## 2015-09-02 ENCOUNTER — Ambulatory Visit: Payer: 59 | Admitting: Physical Therapy

## 2015-09-02 DIAGNOSIS — R269 Unspecified abnormalities of gait and mobility: Secondary | ICD-10-CM

## 2015-09-02 DIAGNOSIS — G8191 Hemiplegia, unspecified affecting right dominant side: Secondary | ICD-10-CM | POA: Diagnosis not present

## 2015-09-02 NOTE — Therapy (Signed)
[facility] [address] [place], [place], [address] Phone: [phone]   Fax:  [phone]  Physical Therapy Treatment  Patient Details  Name: Dakota [name] MRN: [id number] Date of Birth: Aug 09, 1986 Referring Provider: Dwana Melena, MD  Encounter Date: 09/02/2015      PT End of Session - 09/02/15 1039    Visit Number 10   Number of Visits 17   Date for PT Re-Evaluation 09/16/15   Authorization Type [employer]   PT Start Time 3536   PT Stop Time 1018   PT Time Calculation (min) 43 min   Activity Tolerance Patient tolerated treatment well   Behavior During Therapy [facility] for tasks assessed/performed      Past Medical History  Diagnosis Date  . Hx of renal calculi     No past surgical history on file.  There were no vitals filed for this visit.  Visit Diagnosis:  Abnormality of gait  Right hemiparesis [facility])      Subjective Assessment - 09/02/15 0938    Subjective Pt reports no significant changes, no falls. Still feels as though he is doing well without brace. Pt went to [employer] basketball game, and "it about killed me," per pt, in reference to how far he had to walk. Pt reports pain in lateral aspect of R hip when walking longer distances.   Patient is accompained by: Family member  dad   Pertinent History SAH (06/16/15); L eye contusion, retrobulbar hemorrhage s/p canthotomy   Patient Stated Goals "Just to walk better; walk without the cane or the brace and to use the arm again."   Currently in Pain? No/denies                         [facility] Adult PT Treatment/Exercise - 09/02/15 0001    Ambulation/Gait   Ambulation/Gait Yes   Ambulation/Gait Assistance 6: Modified independent (Device/Increase time)   Ambulation Distance (Feet) 800 Feet  total   Assistive device None   Gait Pattern Step-through pattern;Decreased arm swing - right;Decreased weight shift to right;Abducted-  right;Trendelenburg  no AD, no AFO, no heel wedge   Ambulation Surface Level;Indoor   Gait Comments Multiple 50-75' gait trials without AD, no AFO. Initially, used pt's cell phone to video gait pattern from behind pt to increase pt understanding of R Trendelenburg gait pattern. Pt noted to have increased awareness of gait pattern during this session. After NMR interventions (see below), pt performed multiple gait trials x50' without AD with pt attempting to control R Trendelenburg gait pattern. Noted significant within-session improvement; however, will likely require reinforcement in future sessions.   Neuro Re-ed    Neuro Re-ed Details  Supine with RLE off EOM, R foot on 8" step, pt performed modfied RLE bridging (with RLE off EOM for increased hip extension) x15 reps for increased selective control of R hip extension. With mirror in front of pt for visual feedback, pt performed LLE forward/retro advancement in tall kneeling without UE support with mutlimodal cueing for increased R stance stability. Performed x20 reps prior to consistent R gluteus maximus/medius activation.     Exercises   Exercises Other Exercises   Other Exercises  Pt required 50% cueing and use of mirror to properly perform R clamshell exercises x8 reps, x5 reps (to pt fatigue). Performed R single leg bridging x15 reps with 25% cueing for technique. Prone R hip extension with knee flexion for gluteus maximus activation x10 reps  with significant  compensation. Therefore, performed self-stretch of R hip flexor 3 x60-sec holds with cueing from this PT. See Pt Instructions for details on all home exercises.                PT Education - 09/02/15 1025    Education provided Yes   Education Details HEP: added R hip flexor stretch. Reviewed current HEP. Recommending pt use SPC with onset of L hip drop when walking.   Person(s) Educated Patient;Parent(s)  dad   Methods Explanation;Demonstration;Verbal cues;Tactile cues;Handout    Comprehension Verbalized understanding;Returned demonstration          PT Short Term Goals - 08/29/15 1919    PT SHORT TERM GOAL #1   Title Pt will perform initial HEP with mod I using paper handout to maximize functional gains made in PT. Target date: 08/15/15   Baseline Met 11/23.   Status Achieved   PT SHORT TERM GOAL #2   Title Pt will negotiate 9 stairs with single rail and mod I (no cueing for safety awareness) to indicate safety using primary home entrance. Target date: 08/15/15   Baseline Achieved: 08/17/15. Pt negotiated 12 stairs mod I.   Status Achieved   PT SHORT TERM GOAL #3   Title Pt will increase gait velocity from 1.97 ft/sec to 2.27 ft/sec to indicate increase efficiency of ambulation. Target date: 08/15/15   Baseline Achieved: 08/17/15. Pt achieve 2.96f/sec gait speed.   Status Achieved   PT SHORT TERM GOAL #4   Title Pt will independently ambulate 200' without AD using AFO to progress toward PLOF and pt-stated goal of independent ambulation. Target date: 08/15/15   Baseline Achieved: 08/17/15. Pt I with ambulating 2079fwithout AD with AFO on indoor, level surfaces.   Status Achieved   PT SHORT TERM GOAL #5   Title Pt will ambulate 500' over unlevel, paved surfaces with mod I using LRAD to indicate increased safety with limited community mobility. Target date: 08/15/15   Baseline Assessed: 08/17/15. Pt supervision on unlevel, grass. Pt mod I on outdoor,  level, paved surface.    Status Partially Met   PT SHORT TERM GOAL #6   Title Complete DGI to assess dynamic gait stability. Target date: 08/15/15   Baseline 11/18: DGI score = 13/24   Status Achieved   PT SHORT TERM GOAL #7   Title Pt will improve 6MWT distance from 1,022' to 1,079' to indicate improved functional endurance.Target date: 09/12/15   Status New           PT Long Term Goals - 08/17/15 1648    PT LONG TERM GOAL #1   Title Pt will increase gait velocity from 1.97 ft/sec to > 2.62 ft/sec to indicate  status of community ambulator. Target date: 09/12/15   PT LONG TERM GOAL #2   Title Pt will score >/= 20/24 on DGI to indicate decreased risk of falling. Target date: 09/12/15   PT LONG TERM GOAL #3   Title Pt will independently negotiate 9 stairs with single rail and reciprocal pattern to indicate pt independence using primary home entrance. Target date: 09/12/15   PT LONG TERM GOAL #4   Title Pt will independently ambulate 200' over level, indoor surfaces without assistive device or AFO to progress toward PLOF as independent with household ambulation. Target date: 09/12/15   PT LONG TERM GOAL #5   Title Pt will ambulate >1,000' over outdoor, paved surfaces with mod I using LRAD to indicate increased independence with commnuity mobility. Target date:  09/12/15   PT LONG TERM GOAL #6   Title Pt will negotiate standard ramp and curb step with mod I using LRAD to indicate safety traversing community obstacles. Target date: 09/12/15               Plan - 09/02/15 1040    Clinical Impression Statement Session continued to focus on increasing R hip stability during gait to improve walking tolerance and decrease R hip pain with ambulating prolonged distances. Pt demonstrated improved awareness of onset of R Trendelenburg during gait. Recommending pt use SPC with onset of hip instability. Pt/father verbally agreed.    Pt will benefit from skilled therapeutic intervention in order to improve on the following deficits Abnormal gait;Decreased cognition;Decreased balance;Decreased strength;Decreased safety awareness;Impaired vision/preception;Impaired UE functional use;Impaired tone   Rehab Potential Good   Clinical Impairments Affecting Rehab Potential decreased safety awareness; impaired vision in L eye   PT Frequency 2x / week   PT Duration 8 weeks   PT Treatment/Interventions ADLs/Self Care Home Management;Electrical Stimulation;DME Instruction;Gait training;Stair training;Functional mobility  training;Therapeutic activities;Therapeutic exercise;Balance training;Neuromuscular re-education;Vestibular;Visual/perceptual remediation/compensation   PT Next Visit Plan Review HEP (from 12/23) again. Ask about walking program. Continue to address R hip stability during gait.    Consulted and Agree with Plan of Care Patient;Family member/caregiver   Family Member Consulted dad        Problem List Patient Active Problem List   Diagnosis Date Noted  . 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 Urinary retention 06/23/2015  . Acute respiratory failure ([place]) 06/23/2015  . Traumatic subarachnoid bleed with LOC of 6 hours to 24 hours ([place]) 06/23/2015  . Right spastic hemiparesis ([place]) 06/23/2015  . Fall 06/22/2015  . Ocular trauma of right eye 06/22/2015  . TBI (traumatic brain injury) ([place]) 06/16/2015   Billie Ruddy, PT, [facility] [address] [place], [place], [address] Phone: [phone]   Fax:  [phone] 09/02/2015, 10:45 AM  Name: Dakota [name] MRN: [id number] Date of Birth: [birth year]

## 2015-09-02 NOTE — Patient Instructions (Signed)
Abduction: Clam (Eccentric) - Side-Lying - RIGHT    Lie on LEFT side with knees bent. Lift top (RIGHT) knee, keeping feet together. Keep trunk steady. Slowly lower for 3-5 seconds. ** Try not to let your hips move when you raise your leg up. _8_ reps per set, _3__ sets per day.   Bridging (Single Leg) - RIGHT    Lie on back with feet shoulder width apart and left leg straight. Use your RIGHT leg to lift hips toward the ceiling while keeping leg straight. Hold __1-2__ seconds. Repeat __12__ times. Do _3___ sessions per day.  Quads / HF, Supine - RIGHT    Lie near edge of bed, LEFT leg bent, foot flat on bed. RIGHT leg hanging over edge, relaxed, thigh resting entirely on bed. Bend hanging knee backward keeping thigh in contact with bed. Hold _60__ seconds.  Repeat 2-3 times per day on the LEFT leg.  Walking Program:  Begin walking for exercise for 7 minutes, 1 times/day, 5 days/week.  Progress your walking program by adding 1 minute to your routine each week, as tolerated. Be sure to wear good walking shoes, use your cane, walk in a safe environment and only progress to your tolerance.

## 2015-09-06 ENCOUNTER — Ambulatory Visit: Payer: 59 | Admitting: Occupational Therapy

## 2015-09-06 ENCOUNTER — Ambulatory Visit: Payer: 59 | Admitting: Physical Therapy

## 2015-09-09 ENCOUNTER — Ambulatory Visit: Payer: 59 | Admitting: Physical Therapy

## 2015-09-09 ENCOUNTER — Ambulatory Visit: Payer: 59 | Admitting: Occupational Therapy

## 2015-09-09 DIAGNOSIS — R2681 Unsteadiness on feet: Secondary | ICD-10-CM

## 2015-09-09 DIAGNOSIS — G8191 Hemiplegia, unspecified affecting right dominant side: Secondary | ICD-10-CM | POA: Diagnosis not present

## 2015-09-09 DIAGNOSIS — R41840 Attention and concentration deficit: Secondary | ICD-10-CM

## 2015-09-09 DIAGNOSIS — R279 Unspecified lack of coordination: Secondary | ICD-10-CM

## 2015-09-09 DIAGNOSIS — R269 Unspecified abnormalities of gait and mobility: Secondary | ICD-10-CM

## 2015-09-09 DIAGNOSIS — R252 Cramp and spasm: Secondary | ICD-10-CM

## 2015-09-09 NOTE — Therapy (Signed)
[facility] [address] [place], [place], [address] Phone: [phone]   Fax:  [phone]  Physical Therapy Treatment  Patient Details  Name: Dakota [name] MRN: [id number] Date of Birth: 05-29-1986 Referring Provider: Dwana Melena, MD  Encounter Date: 09/09/2015      PT End of Session - 09/09/15 1920    Visit Number 11   Number of Visits 17   Date for PT Re-Evaluation 09/16/15   Authorization Type [employer]   PT Start Time 1020   PT Stop Time 1100   PT Time Calculation (min) 40 min   Activity Tolerance Patient tolerated treatment well   Behavior During Therapy [facility] for tasks assessed/performed      Past Medical History  Diagnosis Date  . Hx of renal calculi     No past surgical history on file.  There were no vitals filed for this visit.  Visit Diagnosis:  Abnormality of gait  Right hemiparesis ([facility])  Unsteadiness      Subjective Assessment - 09/09/15 1023    Subjective Pt reports no significant changes, no falls. Pt reports ongoing pain in R hip when walking longer distances.   Patient is accompained by: Family member  father   Pertinent History SAH (06/16/15); L eye contusion, retrobulbar hemorrhage s/p canthotomy   Patient Stated Goals "Just to walk better; walk without the cane or the brace and to use the arm again."   Currently in Pain? No/denies                         [facility] Adult PT Treatment/Exercise - 09/09/15 0001    Ambulation/Gait   Ambulation/Gait Yes   Ambulation/Gait Assistance 6: Modified independent (Device/Increase time);5: Supervision   Ambulation/Gait Assistance Details supervision, cueing to address R Trendeleburg gait pattern, lateral hip instability.   Ambulation Distance (Feet) 500 Feet   Assistive device None   Gait Pattern Step-through pattern;Decreased arm swing - right;Decreased weight shift to right;Trendelenburg  R Trendelenburg pattern   Ambulation  Surface Level;Indoor   Neuro Re-ed    Neuro Re-ed Details  Supine, hook lying with green Tband around B thighs and RLE extended off EOM on 8" step, LLE on rolling chair (to decrease overuse/compensation with LLE), pt performed RLE modified bridging with concurrent hip ABD x12 reps then x8 reps (to pt fatigue). RLE half tall kneeling, pt used RUE to roll chair (R shoulder ABD ~80 degrees) laterally to facilitate R pelvic protraction, R gluteus medius activation x14 reps, 2 x10 reps (to pt fatigue) with multimodal cueing for technique and min guard-mod A to prevent posterior LOB.   Exercises   Exercises Other Exercises   Other Exercises  Pt performed the following exercises (to fatigue) independently without cueing for technique: R clamshells x16 reps; R single leg bridge x12 reps; and R iliopsoas, rectus femoris self-stretch x60 seconds.                  PT Short Term Goals - 08/29/15 1919    PT SHORT TERM GOAL #1   Title Pt will perform initial HEP with mod I using paper handout to maximize functional gains made in PT. Target date: 08/15/15   Baseline Met 11/23.   Status Achieved   PT SHORT TERM GOAL #2   Title Pt will negotiate 9 stairs with single rail and mod I (no cueing for safety awareness) to indicate safety using primary home entrance. Target date: 08/15/15   Baseline  Achieved: 08/17/15. Pt negotiated 12 stairs mod I.   Status Achieved   PT SHORT TERM GOAL #3   Title Pt will increase gait velocity from 1.97 ft/sec to 2.27 ft/sec to indicate increase efficiency of ambulation. Target date: 08/15/15   Baseline Achieved: 08/17/15. Pt achieve 2.56f/sec gait speed.   Status Achieved   PT SHORT TERM GOAL #4   Title Pt will independently ambulate 200' without AD using AFO to progress toward PLOF and pt-stated goal of independent ambulation. Target date: 08/15/15   Baseline Achieved: 08/17/15. Pt I with ambulating 2037fwithout AD with AFO on indoor, level surfaces.   Status Achieved   PT  SHORT TERM GOAL #5   Title Pt will ambulate 500' over unlevel, paved surfaces with mod I using LRAD to indicate increased safety with limited community mobility. Target date: 08/15/15   Baseline Assessed: 08/17/15. Pt supervision on unlevel, grass. Pt mod I on outdoor,  level, paved surface.    Status Partially Met   PT SHORT TERM GOAL #6   Title Complete DGI to assess dynamic gait stability. Target date: 08/15/15   Baseline 11/18: DGI score = 13/24   Status Achieved   PT SHORT TERM GOAL #7   Title Pt will improve 6MWT distance from 1,022' to 1,079' to indicate improved functional endurance.Target date: 09/12/15   Status New           PT Long Term Goals - 08/17/15 1648    PT LONG TERM GOAL #1   Title Pt will increase gait velocity from 1.97 ft/sec to > 2.62 ft/sec to indicate status of community ambulator. Target date: 09/12/15   PT LONG TERM GOAL #2   Title Pt will score >/= 20/24 on DGI to indicate decreased risk of falling. Target date: 09/12/15   PT LONG TERM GOAL #3   Title Pt will independently negotiate 9 stairs with single rail and reciprocal pattern to indicate pt independence using primary home entrance. Target date: 09/12/15   PT LONG TERM GOAL #4   Title Pt will independently ambulate 200' over level, indoor surfaces without assistive device or AFO to progress toward PLOF as independent with household ambulation. Target date: 09/12/15   PT LONG TERM GOAL #5   Title Pt will ambulate >1,000' over outdoor, paved surfaces with mod I using LRAD to indicate increased independence with commnuity mobility. Target date: 09/12/15   PT LONG TERM GOAL #6   Title Pt will negotiate standard ramp and curb step with mod I using LRAD to indicate safety traversing community obstacles. Target date: 09/12/15               Plan - 09/09/15 1926    Clinical Impression Statement Session focused on facilitating R hip abductor activation, promoting pelvic protraction during R stance phase of gait. With use  of half tall kneeling position, able to elicit R gluteus medius activation; however, noted decreased muscle endurance.    Pt will benefit from skilled therapeutic intervention in order to improve on the following deficits Abnormal gait;Decreased cognition;Decreased balance;Decreased strength;Decreased safety awareness;Impaired vision/preception;Impaired UE functional use;Impaired tone   Rehab Potential Good   Clinical Impairments Affecting Rehab Potential decreased safety awareness; impaired vision in L eye   PT Frequency 2x / week   PT Duration 8 weeks   PT Next Visit Plan Continue to address R hip stability during gait. Begin checking LTG's.   Consulted and Agree with Plan of Care Patient;Family member/caregiver   Family Member Consulted dad  Problem List Patient Active Problem List   Diagnosis Date Noted  . 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 Urinary retention 06/23/2015  . Acute respiratory failure ([place]) 06/23/2015  . Traumatic subarachnoid bleed with LOC of 6 hours to 24 hours ([place]) 06/23/2015  . Right spastic hemiparesis ([place]) 06/23/2015  . Fall 06/22/2015  . Ocular trauma of right eye 06/22/2015  . TBI (traumatic brain injury) ([place]) 06/16/2015   Billie Ruddy, PT, [facility] [address] [place], [place], [address] Phone: [phone]   Fax:  [phone] 09/09/2015, 7:29 PM  Name: Dakota [name] MRN: [id number] Date of Birth: 11-26-1985

## 2015-09-09 NOTE — Therapy (Signed)
[facility] [address] [place], [place], [address] Phone: [phone]   Fax:  [phone]  Occupational Therapy Treatment  Patient Details  Name: Dakota [name] MRN: [id number] Date of Birth: 11-Oct-1985 No Data Recorded  Encounter Date: 09/09/2015      OT End of Session - 09/09/15 1007    Visit Number 9   Number of Visits 17   Date for OT Re-Evaluation 09/22/14  extended as pt has not been seen for frequency   Authorization Time Period week 6/8   OT Start Time 0936   OT Stop Time 1015   OT Time Calculation (min) 39 min   Activity Tolerance Patient tolerated treatment well   Behavior During Therapy [facility] for tasks assessed/performed      Past Medical History  Diagnosis Date  . Hx of renal calculi     No past surgical history on file.  There were no vitals filed for this visit.  Visit Diagnosis:  Right hemiparesis ([place])  Lack of coordination  Spasticity  Inattention      Subjective Assessment - 09/09/15 0937    Patient is accompained by: Family member   Pertinent History Pt hospitalized on 06/16/15 for TBI with SAH, L eye injury (retrobulbar hemorrhage). Transferred to inpatient rehab on 10/13, where he remained until discharged home on 07/14/15.   Patient Stated Goals use RUE again   Currently in Pain? Yes   Pain Score 2    Pain Location Shoulder   Pain Orientation Right   Pain Descriptors / Indicators Aching   Pain Onset More than a month ago   Pain Frequency Intermittent   Aggravating Factors  malpositioning   Pain Relieving Factors repositioning   Multiple Pain Sites No        Treatment: NMES to finger and wrist extensors, 50 pps, 250 pw, 10 secs cycle x 10 mins, with pt performing functional grasp/ release objects with RUE, min-mod facilitation Quadraped on mat for cat/ cow positions, rocking side to side then lifting LUE off mat  With mod facilitation at right elbow. Supine 2 sets of 10  reps for chest press and shoulder flexion with PVC pipe frame, min facilitation at left elbow, pt demonstrates improved control and less compensatory shoulder hike. Seated weightbearing through tilted stool with RUE for shoulder flexion/ elbow extension x 15 reps min v.c./ facilitation.                        OT Short Term Goals - 08/22/15 1540    OT SHORT TERM GOAL #1   Title Pt will be independent with initial HEP--check STGs 08/16/15   Time 4   Period Days   Status On-going   OT SHORT TERM GOAL #2   Title Pt will demo at least 80* R shoulder flex with minimal compensation in prep for functional reach.   Baseline 70*   Time 4   Status On-going  08/22/15  60-70* with mod compensation   OT SHORT TERM GOAL #3   Title Pt will demo at least 50% gross finger ext for grasp/release of objects.   Baseline trace   Time 4   Period Weeks   Status On-going  08/22/15  25-50% varies with spasticity   OT SHORT TERM GOAL #4   Title Pt will be able to use RUE as gross assist/stabilizer at least 25% of the time for ADLs.   Time 4   Period Weeks   Status  Achieved  08/22/15:  approx this level per pt report   OT SHORT TERM GOAL #5   Title Pt will grasp/release small cylinder object with min A in 4/5 trials.   Time 4   Period Weeks   Status Achieved  08/22/15  met at approx this level   OT SHORT TERM GOAL #6   Title Pt will be independent with splint wear/care prn.   Time 4   Period Weeks   Status On-going  08/22/15 partially met only 4-6 hrs           OT Long Term Goals - 08/19/15 1558    OT LONG TERM GOAL #1   Title Pt will be independent with updated HEP.--check LTGs 09/16/15   Time 8   Period Weeks   Status On-going   OT LONG TERM GOAL #2   Title Pt will demo at least 90* R shoulder flex with minimal compensation in prep for functional reach.   Baseline 70*   Time 8   Period Weeks   Status On-going   OT LONG TERM GOAL #3   Title Pt will improve RUE  functional reaching and coordination as shown by scoring at least 8 on box and blocks test.   Baseline 0   Time 8   Period Weeks   Status On-going   OT LONG TERM GOAL #4   Title Pt will use RUE as gross assist/stabilizer at least 50% of the time for ADLs.   Time 8   Period Weeks   Status On-going   OT LONG TERM GOAL #5   Title Pt will demo good safety awareness during functional tasks and per family report.   Time 8   Period Weeks   Status On-going   OT LONG TERM GOAL #6   Title Pt will verbalize understanding of cognitive and visual compensation strategies prn.   Time 8   Period Weeks   Status On-going               Plan - 09/09/15 1005    Clinical Impression Statement Pt is progressing towards goals with improving active movment  and right UE control. Pt continues to utilize compensatory patterns of movement   Pt will benefit from skilled therapeutic intervention in order to improve on the following deficits (Retired) Decreased cognition;Decreased mobility;Decreased strength;Impaired vision/preception;Impaired UE functional use;Pain;Decreased knowledge of use of DME;Decreased balance;Impaired tone;Decreased safety awareness;Decreased coordination;Decreased range of motion;Impaired sensation   Rehab Potential Good   OT Frequency 2x / week   OT Duration 8 weeks   OT Treatment/Interventions Self-care/ADL training;Cryotherapy;Parrafin;Electrical Stimulation;Fluidtherapy;Moist Heat;Contrast Bath;Passive range of motion;Visual/perceptual remediation/compensation;Cognitive remediation/compensation;DME and/or AE instruction;Therapeutic activities;Therapeutic exercises;Neuromuscular education;Ultrasound;Manual Therapy;Splinting;Patient/family education;Therapist, nutritional;Therapeutic exercise   Plan NMR   Consulted and Agree with Plan of Care Patient;Family member/caregiver   Family Member Consulted father        Problem List Patient Active Problem List   Diagnosis  Date Noted  . 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 Urinary retention 06/23/2015  . Acute respiratory failure ([place]) 06/23/2015  . Traumatic subarachnoid bleed with LOC of 6 hours to 24 hours ([place]) 06/23/2015  . Right spastic hemiparesis ([place]) 06/23/2015  . Fall 06/22/2015  . Ocular trauma of right eye 06/22/2015  . TBI (traumatic brain injury) ([place]) 06/16/2015    Denicia Pagliarulo 09/09/2015, 10:47 AM Theone Murdoch, OTR/L [phone] Phone: [phone] 10:47 AM 09/09/2015 [facility] [address] [place], [place], [address] Phone: [phone]   Fax:  [phone]  Name: Dakota [name] MRN: [id number] Date of Birth: Oct 22, 1985

## 2015-09-14 ENCOUNTER — Ambulatory Visit: Payer: BLUE CROSS/BLUE SHIELD | Admitting: Occupational Therapy

## 2015-09-14 ENCOUNTER — Encounter: Payer: Self-pay | Admitting: Physical Therapy

## 2015-09-14 ENCOUNTER — Ambulatory Visit: Payer: BLUE CROSS/BLUE SHIELD | Attending: Physical Medicine & Rehabilitation | Admitting: Physical Therapy

## 2015-09-14 DIAGNOSIS — R269 Unspecified abnormalities of gait and mobility: Secondary | ICD-10-CM

## 2015-09-14 DIAGNOSIS — R279 Unspecified lack of coordination: Secondary | ICD-10-CM

## 2015-09-14 DIAGNOSIS — R52 Pain, unspecified: Secondary | ICD-10-CM | POA: Diagnosis present

## 2015-09-14 DIAGNOSIS — R41842 Visuospatial deficit: Secondary | ICD-10-CM | POA: Insufficient documentation

## 2015-09-14 DIAGNOSIS — R2681 Unsteadiness on feet: Secondary | ICD-10-CM | POA: Insufficient documentation

## 2015-09-14 DIAGNOSIS — R4189 Other symptoms and signs involving cognitive functions and awareness: Secondary | ICD-10-CM | POA: Diagnosis present

## 2015-09-14 DIAGNOSIS — R258 Other abnormal involuntary movements: Secondary | ICD-10-CM | POA: Insufficient documentation

## 2015-09-14 DIAGNOSIS — G8191 Hemiplegia, unspecified affecting right dominant side: Secondary | ICD-10-CM

## 2015-09-14 DIAGNOSIS — R41840 Attention and concentration deficit: Secondary | ICD-10-CM | POA: Diagnosis present

## 2015-09-14 DIAGNOSIS — R252 Cramp and spasm: Secondary | ICD-10-CM

## 2015-09-14 NOTE — Therapy (Signed)
[facility] [address] [place], [place], [address] Phone: [phone]   Fax:  [phone]  Physical Therapy Treatment  Patient Details  Name: Dakota [name] MRN: [id number] Date of Birth: [birth year] Referring Provider: Dwana Melena, MD  Encounter Date: 09/14/2015      PT End of Session - 09/14/15 1454    Visit Number 12   Number of Visits 17   Date for PT Re-Evaluation 09/16/15   Authorization Type BCBS   PT Start Time 1450   PT Stop Time 1530   PT Time Calculation (min) 40 min   Activity Tolerance Patient tolerated treatment well   Behavior During Therapy [facility] for tasks assessed/performed      Past Medical History  Diagnosis Date  . Hx of renal calculi     History reviewed. No pertinent past surgical history.  There were no vitals filed for this visit.  Visit Diagnosis:  Unsteadiness  Abnormality of gait  Lack of coordination  Right hemiparesis ([place])      Subjective Assessment - 09/14/15 1453    Subjective No new complaints. No pain currently, does still get some with increased walking distance. No falls.   Patient is accompained by: Family member  spouse   Pertinent History SAH (06/16/15); L eye contusion, retrobulbar hemorrhage s/p canthotomy   Patient Stated Goals "Just to walk better; walk without the cane or the brace and to use the arm again."   Currently in Pain? No/denies   Pain Score 0-No pain            [facility] Adult PT Treatment/Exercise - 09/14/15 1455    Ambulation/Gait   Ambulation/Gait Yes   Ambulation/Gait Assistance 6: Modified independent (Device/Increase time);5: Supervision   Ambulation Distance (Feet) 1000 Feet   Assistive device None   Gait Pattern Step-through pattern;Decreased arm swing - right;Decreased weight shift to right;Trendelenburg   Ambulation Surface Level;Unlevel;Indoor;Outdoor;Paved   Gait velocity 9.37 sec's= 3.5 ft/sec with no AD/brace/wedge   Stairs  Yes   Stairs Assistance 5: Supervision   Stairs Assistance Details (indicate cue type and reason) recurvatum noted x 2 episodes   Stair Management Technique One rail Right;One rail Left;Alternating pattern;Forwards   Number of Stairs 12   Height of Stairs 6   Ramp 5: Supervision   Ramp Details (indicate cue type and reason) x 2 reps. toe scuffing noted x 1 on 1st rep, none on 2cd rep   Curb 5: Supervision   Dynamic Gait Index   Level Surface Normal   Change in Gait Speed Normal   Gait with Horizontal Head Turns Mild Impairment   Gait with Vertical Head Turns Mild Impairment   Gait and Pivot Turn Mild Impairment   Step Over Obstacle Mild Impairment   Step Around Obstacles Normal   Steps Mild Impairment   Total Score 19      exercises Left side lying: clamshell 2 sets of 10 reps, 5 sec holds, with cues/manual assist for correct form. Left side lying: hip abductions 2 sets of 10 reps, 5 sec holds, cues/manual assist for correct form. Supine: right knee bent with foot on mat, left knee bent with leg hanging off edge of mat- foot resting on foot stool: single leg bridging with pt pushing through right leg. 2 sets of 10 reps, 5 sec holds. Prone: right glut kick backs with assist for ex form/technique. 2 sets of 10 reps.             PT Short Term  Goals - 08/29/15 1919    PT SHORT TERM GOAL #1   Title Pt will perform initial HEP with mod I using paper handout to maximize functional gains made in PT. Target date: 08/15/15   Baseline Met 11/23.   Status Achieved   PT SHORT TERM GOAL #2   Title Pt will negotiate 9 stairs with single rail and mod I (no cueing for safety awareness) to indicate safety using primary home entrance. Target date: 08/15/15   Baseline Achieved: 08/17/15. Pt negotiated 12 stairs mod I.   Status Achieved   PT SHORT TERM GOAL #3   Title Pt will increase gait velocity from 1.97 ft/sec to 2.27 ft/sec to indicate increase efficiency of ambulation. Target date:  08/15/15   Baseline Achieved: 08/17/15. Pt achieve 2.44f/sec gait speed.   Status Achieved   PT SHORT TERM GOAL #4   Title Pt will independently ambulate 200' without AD using AFO to progress toward PLOF and pt-stated goal of independent ambulation. Target date: 08/15/15   Baseline Achieved: 08/17/15. Pt I with ambulating 2082fwithout AD with AFO on indoor, level surfaces.   Status Achieved   PT SHORT TERM GOAL #5   Title Pt will ambulate 500' over unlevel, paved surfaces with mod I using LRAD to indicate increased safety with limited community mobility. Target date: 08/15/15   Baseline Assessed: 08/17/15. Pt supervision on unlevel, grass. Pt mod I on outdoor,  level, paved surface.    Status Partially Met   PT SHORT TERM GOAL #6   Title Complete DGI to assess dynamic gait stability. Target date: 08/15/15   Baseline 11/18: DGI score = 13/24   Status Achieved   PT SHORT TERM GOAL #7   Title Pt will improve 6MWT distance from 1,022' to 1,079' to indicate improved functional endurance.Target date: 09/12/15   Status New           PT Long Term Goals - 09/14/15 1455    PT LONG TERM GOAL #1   Title Pt will increase gait velocity from 1.97 ft/sec to > 2.62 ft/sec to indicate status of community ambulator. Target date: 09/12/15   Baseline 09/14/15: 3.5 ft/sec without brace/AD   Status Achieved   PT LONG TERM GOAL #2   Title Pt will score >/= 20/24 on DGI to indicate decreased risk of falling. Target date: 09/12/15   Baseline 09/14/15: scored 19/24    Status Not Met   PT LONG TERM GOAL #3   Title Pt will independently negotiate 9 stairs with single rail and reciprocal pattern to indicate pt independence using primary home entrance. Target date: 09/12/15   Baseline met on 09/14/15 with mild recurvatum noted without brace   Status Achieved   PT LONG TERM GOAL #4   Title Pt will independently ambulate 200' over level, indoor surfaces without assistive device or AFO to progress toward PLOF as independent with  household ambulation. Target date: 09/12/15   Baseline met on 09/14/15   Status Achieved   PT LONG TERM GOAL #5   Title Pt will ambulate >1,000' over outdoor, paved surfaces with mod I using LRAD to indicate increased independence with commnuity mobility. Target date: 09/12/15   Baseline 09/14/15: supervision without brace/AD   Status Not Met   PT LONG TERM GOAL #6   Title Pt will negotiate standard ramp and curb step with mod I using LRAD to indicate safety traversing community obstacles. Target date: 09/12/15   Baseline 09/14/15: supervision with out brace/AD   Status Not  Met        09/14/15 1454  Plan  Clinical Impression Statement Pt has met most LTGs. Continues to demo decreased right hip/LE strengthening with trendelenburg gait pattern and occasion knee genurecurvatum.   Pt will benefit from skilled therapeutic intervention in order to improve on the following deficits Abnormal gait;Decreased cognition;Decreased balance;Decreased strength;Decreased safety awareness;Impaired vision/preception;Impaired UE functional use;Impaired tone  Rehab Potential Good  Clinical Impairments Affecting Rehab Potential decreased safety awareness; impaired vision in L eye  PT Frequency 2x / week  PT Duration 8 weeks  PT Next Visit Plan Continue to address R hip stability during gait.   Consulted and Agree with Plan of Care Patient;Family member/caregiver  Family Member Consulted dad       Problem List Patient Active Problem List   Diagnosis Date Noted  . 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 Urinary retention 06/23/2015  . Acute respiratory failure ([place]) 06/23/2015  . Traumatic subarachnoid bleed with LOC of 6 hours to 24 hours ([place]) 06/23/2015  . Right spastic hemiparesis ([place]) 06/23/2015  . Fall 06/22/2015  . Ocular trauma of right eye 06/22/2015  . TBI (traumatic brain injury) [facility]) 06/16/2015    Willow Ora 09/15/2015, 3:29 PM  Willow Ora, PTA, [facility] [address],  [place], [place] [address] [phone] 09/15/2015, 3:29 PM   Name: Dakota [name] MRN: [id number] Date of Birth: 1986-08-22

## 2015-09-14 NOTE — Therapy (Signed)
[facility] [address] [place], [place], [address] Phone: [phone]   Fax:  [phone]  Occupational Therapy Treatment  Patient Details  Name: Dakota [name] MRN: [id number] Date of Birth: 1986-04-03 No Data Recorded  Encounter Date: 09/14/2015      OT End of Session - 09/14/15 2027    Visit Number 10   Number of Visits 17   Date for OT Re-Evaluation 09/22/14  extended as pt has not been seen for frequency   Authorization Type BCBS, awaiting for verification of benefits   Authorization Time Period week 7/8   OT Start Time 1534   OT Stop Time 1615   OT Time Calculation (min) 41 min   Activity Tolerance Patient tolerated treatment well   Behavior During Therapy [facility] for tasks assessed/performed      Past Medical History  Diagnosis Date  . Hx of renal calculi     No past surgical history on file.  There were no vitals filed for this visit.  Visit Diagnosis:  Right hemiparesis ([place])  Lack of coordination  Spasticity  Inattention      Subjective Assessment - 09/14/15 2011    Subjective  Pt reports that pain has been better.   Pertinent History Pt hospitalized on 06/16/15 for TBI with SAH, L eye injury (retrobulbar hemorrhage). Transferred to inpatient rehab on 10/13, where he remained until discharged home on 07/14/15.   Patient Stated Goals use RUE again   Currently in Pain? Yes   Pain Score 3    Pain Location Shoulder   Pain Orientation Right   Pain Descriptors / Indicators Aching   Pain Frequency Intermittent   Aggravating Factors  malpositioning   Pain Relieving Factors repositioning                      OT Treatments/Exercises (OP) - 09/14/15 0001    Neurological Re-education Exercises   Shoulder Flexion AAROM;Both;Supine  PVC frame w/ min cues/facilitation for positioning and scapular facilitation   Elbow Extension AROM;Supine;AAROM;Right  with PVC frame (min facilitation),  AROM min v.c. For wrist ext to neutral and relaxed fingers   Finger Extension stretch followed by attempts to actively relax and then ext fingers   Other Exercises 1 Focus on normal movement patterns in prep for functional release (low range shoulder flex with elbow ext AAROM while attempting to extend wrist to neutral and relax finger. with mod cues/min facilitation   Other Grasp and Release Exercises  Low range functional reaching to grasp 1-inch blocks with facilitation/stretch to open fingers prior to grasp.  Pt needed mod facilitation for finger ext for grasp and min cueing for compensation with reach, but able to relax/and extend fingers actively for release.   Other Weight-Bearing Exercises 1 Wt. bearing in modified quadraped with cat/cow positions for incr scapular stability/movement and decr spasticity with min cueing/facilitation.   Modalities   Modalities Retail buyer Location dorsal forearm   Electrical Stimulation Action wrist/finger extension   Electrical Stimulation Parameters 50pps, 250 pulse width, 10 sec cycle, 2 sec ramp, intensity=19-21 with no adverse reactions WHILE pt performing low range functional reach for cylinder objects with min facilitation/cueing for compensation with reach and to stabilize objects   Electrical Stimulation Goals Tone;Neuromuscular facilitation                OT Education - 09/14/15 2014    Education Details Reviewed proper positioning of RUE  with movement   Person(s) Educated Patient;Spouse   Methods Explanation;Demonstration;Verbal cues;Tactile cues   Comprehension Verbalized understanding;Returned demonstration;Verbal cues required;Tactile cues required          OT Short Term Goals - 08/22/15 1540    OT SHORT TERM GOAL #1   Title Pt will be independent with initial HEP--check STGs 08/16/15   Time 4   Period Days   Status On-going   OT SHORT TERM GOAL #2   Title Pt will  demo at least 80* R shoulder flex with minimal compensation in prep for functional reach.   Baseline 70*   Time 4   Status On-going  08/22/15  60-70* with mod compensation   OT SHORT TERM GOAL #3   Title Pt will demo at least 50% gross finger ext for grasp/release of objects.   Baseline trace   Time 4   Period Weeks   Status On-going  08/22/15  25-50% varies with spasticity   OT SHORT TERM GOAL #4   Title Pt will be able to use RUE as gross assist/stabilizer at least 25% of the time for ADLs.   Time 4   Period Weeks   Status Achieved  08/22/15:  approx this level per pt report   OT SHORT TERM GOAL #5   Title Pt will grasp/release small cylinder object with min A in 4/5 trials.   Time 4   Period Weeks   Status Achieved  08/22/15  met at approx this level   OT SHORT TERM GOAL #6   Title Pt will be independent with splint wear/care prn.   Time 4   Period Weeks   Status On-going  08/22/15 partially met only 4-6 hrs           OT Long Term Goals - 08/19/15 1558    OT LONG TERM GOAL #1   Title Pt will be independent with updated HEP.--check LTGs 09/16/15   Time 8   Period Weeks   Status On-going   OT LONG TERM GOAL #2   Title Pt will demo at least 90* R shoulder flex with minimal compensation in prep for functional reach.   Baseline 70*   Time 8   Period Weeks   Status On-going   OT LONG TERM GOAL #3   Title Pt will improve RUE functional reaching and coordination as shown by scoring at least 8 on box and blocks test.   Baseline 0   Time 8   Period Weeks   Status On-going   OT LONG TERM GOAL #4   Title Pt will use RUE as gross assist/stabilizer at least 50% of the time for ADLs.   Time 8   Period Weeks   Status On-going   OT LONG TERM GOAL #5   Title Pt will demo good safety awareness during functional tasks and per family report.   Time 8   Period Weeks   Status On-going   OT LONG TERM GOAL #6   Title Pt will verbalize understanding of cognitive and visual  compensation strategies prn.   Time 8   Period Weeks   Status On-going               Plan - 09/14/15 2028    Clinical Impression Statement Pt is progressing towards goals, but continues to need cueing for compensation patterns (however, movement improves with cues).   Plan neuro re-ed, begin checking goals    Consulted and Agree with Plan of Care Patient;Family member/caregiver  Family Member Consulted wife        Problem List Patient Active Problem List   Diagnosis Date Noted  . 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 Urinary retention 06/23/2015  . Acute respiratory failure ([place]) 06/23/2015  . Traumatic subarachnoid bleed with LOC of 6 hours to 24 hours ([place]) 06/23/2015  . Right spastic hemiparesis ([place]) 06/23/2015  . Fall 06/22/2015  . Ocular trauma of right eye 06/22/2015  . TBI (traumatic brain injury) [facility]) 06/16/2015    [facility] 09/14/2015, 8:31 PM  [facility] [address] [place], [place], [address] Phone: [phone]   Fax:  [phone]  Name: Dakota [name] MRN: [id number] Date of Birth: [birth year]  Vianne Bulls, OTR/L 09/14/2015 8:31 PM

## 2015-09-16 ENCOUNTER — Encounter: Payer: Self-pay | Admitting: Physical Therapy

## 2015-09-16 ENCOUNTER — Ambulatory Visit: Payer: BLUE CROSS/BLUE SHIELD | Admitting: Physical Therapy

## 2015-09-16 ENCOUNTER — Ambulatory Visit: Payer: BLUE CROSS/BLUE SHIELD | Admitting: Occupational Therapy

## 2015-09-16 DIAGNOSIS — R279 Unspecified lack of coordination: Secondary | ICD-10-CM

## 2015-09-16 DIAGNOSIS — R269 Unspecified abnormalities of gait and mobility: Secondary | ICD-10-CM

## 2015-09-16 DIAGNOSIS — R252 Cramp and spasm: Secondary | ICD-10-CM

## 2015-09-16 DIAGNOSIS — R41840 Attention and concentration deficit: Secondary | ICD-10-CM

## 2015-09-16 DIAGNOSIS — R2681 Unsteadiness on feet: Secondary | ICD-10-CM

## 2015-09-16 DIAGNOSIS — R52 Pain, unspecified: Secondary | ICD-10-CM

## 2015-09-16 DIAGNOSIS — G8191 Hemiplegia, unspecified affecting right dominant side: Secondary | ICD-10-CM

## 2015-09-16 NOTE — Therapy (Signed)
[facility] [address] [place], [place], [address] Phone: [phone]   Fax:  [phone]  Physical Therapy Treatment  Patient Details  Name: Dakota [name] MRN: [id number] Date of Birth: 1986-07-21 Referring Provider: Dwana Melena, MD  Encounter Date: 09/16/2015   09/16/15 1018  PT Visits / Re-Eval  Visit Number 13  Number of Visits 17  Date for PT Re-Evaluation 09/16/15  Authorization  Authorization Type BCBS  PT Time Calculation  PT Start Time 1015  PT Stop Time 1057  PT Time Calculation (min) 42 min  PT - End of Session  Activity Tolerance Patient tolerated treatment well  Behavior During Therapy [facility] for tasks assessed/performed     Past Medical History  Diagnosis Date  . Hx of renal calculi     History reviewed. No pertinent past surgical history.  There were no vitals filed for this visit.  Visit Diagnosis:  Lack of coordination     Spasticity     Unsteadiness     Abnormality of gait   5.  Pain         Subjective Assessment - 09/16/15 1017    Subjective No new complaitns. No pain or falls to report. Has not been walking longer distances to know if he still gets pain with longer distances.   Patient is accompained by: Family member  father   Pertinent History SAH (06/16/15); L eye contusion, retrobulbar hemorrhage s/p canthotomy   Patient Stated Goals "Just to walk better; walk without the cane or the brace and to use the arm again."   Currently in Pain? No/denies   Pain Score 0-No pain           [facility] Adult PT Treatment/Exercise - 09/16/15 1020    Ambulation/Gait   Ambulation/Gait Yes   Ambulation/Gait Assistance 6: Modified independent (Device/Increase time);5: Supervision   Ambulation/Gait Assistance Details supervision only due to cues needed to correct trendelenburg gait and right lateral hip instabiliy in stance phase   Ambulation Distance (Feet) 1081 Feet  6 minute walk  test   Assistive device None   Gait Pattern Step-through pattern;Decreased arm swing - right;Decreased weight shift to right;Trendelenburg   Ambulation Surface Level;Indoor     exercises: Left side lying: clamshells Left side lying: hip abduction Prone: right glut kick backs Quadruped: first without red ball support, added ball support after 1st ex due to right wrist discomfort - alternating leg outs x 10 each side - "fire hydrant" x 10 reps each side - glut kick backs x 10 reps  Standing by mat: alternating lifting knee up onto mat and back down with hip hicking/circumduciton action x 10 each side for stance stability and strenghening of hip/glut muscles.  Tall kneeling: - partial sit backs with OH raises x 10 reps        PT Short Term Goals - 09/17/15 1913    PT SHORT TERM GOAL #1   Title Pt will perform initial HEP with mod I using paper handout to maximize functional gains made in PT. Target date: 08/15/15   Baseline Met 11/23.   Status Achieved   PT SHORT TERM GOAL #2   Title Pt will negotiate 9 stairs with single rail and mod I (no cueing for safety awareness) to indicate safety using primary home entrance. Target date: 08/15/15   Baseline Achieved: 08/17/15. Pt negotiated 12 stairs mod I.   Status Achieved   PT SHORT TERM GOAL #3   Title Pt will increase gait velocity  from 1.97 ft/sec to 2.27 ft/sec to indicate increase efficiency of ambulation. Target date: 08/15/15   Baseline Achieved: 08/17/15. Pt achieve 2.69f/sec gait speed.   Status Achieved   PT SHORT TERM GOAL #4   Title Pt will independently ambulate 200' without AD using AFO to progress toward PLOF and pt-stated goal of independent ambulation. Target date: 08/15/15   Baseline Achieved: 08/17/15. Pt I with ambulating 202fwithout AD with AFO on indoor, level surfaces.   Status Achieved   PT SHORT TERM GOAL #5   Title Pt will ambulate 500' over unlevel, paved surfaces with mod I using LRAD to indicate increased  safety with limited community mobility. Target date: 08/15/15   Baseline Assessed: 08/17/15. Pt supervision on unlevel, grass. Pt mod I on outdoor,  level, paved surface.    Status Partially Met   PT SHORT TERM GOAL #6   Title Complete DGI to assess dynamic gait stability. Target date: 08/15/15   Baseline 11/18: DGI score = 13/24   Status Achieved   PT SHORT TERM GOAL #7   Title Pt will improve 6MWT distance from 1,022' to 1,079' to indicate improved functional endurance.Target date: 09/12/15   Status Achieved            PT Long Term Goals - 09/14/15 1455    PT LONG TERM GOAL #1   Title Pt will increase gait velocity from 1.97 ft/sec to > 2.62 ft/sec to indicate status of community ambulator. Target date: 09/12/15   Baseline 09/14/15: 3.5 ft/sec without brace/AD   Status Achieved   PT LONG TERM GOAL #2   Title Pt will score >/= 20/24 on DGI to indicate decreased risk of falling. Target date: 09/12/15   Baseline 09/14/15: scored 19/24    Status Not Met   PT LONG TERM GOAL #3   Title Pt will independently negotiate 9 stairs with single rail and reciprocal pattern to indicate pt independence using primary home entrance. Target date: 09/12/15   Baseline met on 09/14/15 with mild recurvatum noted without brace   Status Achieved   PT LONG TERM GOAL #4   Title Pt will independently ambulate 200' over level, indoor surfaces without assistive device or AFO to progress toward PLOF as independent with household ambulation. Target date: 09/12/15   Baseline met on 09/14/15   Status Achieved   PT LONG TERM GOAL #5   Title Pt will ambulate >1,000' over outdoor, paved surfaces with mod I using LRAD to indicate increased independence with commnuity mobility. Target date: 09/12/15   Baseline 09/14/15: without brace/AD   Status Achieved   PT LONG TERM GOAL #6   Title Pt will negotiate standard ramp and curb step with mod I using LRAD to indicate safety traversing community obstacles. Target date: 09/12/15   Baseline  09/14/15: supervision with out brace/AD   Status Not Met        09/16/15 1019  Plan  Clinical Impression Statement PT met his 6 minute walk test. Continued to work on right leg strengthening today without any issues reported.  Pt will benefit from skilled therapeutic intervention in order to improve on the following deficits Abnormal gait;Decreased cognition;Decreased balance;Decreased strength;Decreased safety awareness;Impaired vision/preception;Impaired UE functional use;Impaired tone  Rehab Potential Good  Clinical Impairments Affecting Rehab Potential decreased safety awareness; impaired vision in L eye  PT Frequency 2x / week  PT Duration 8 weeks  PT Next Visit Plan Continue to address R hip stability during gait; primary PT to renew/discharge next visit (will  discuss plan when PT returns to clinic and PT can review progress toward goals).  Consulted and Agree with Plan of Care Patient;Family member/caregiver  Family Member Consulted dad    Problem List Patient Active Problem List   Diagnosis Date Noted  . 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 Urinary retention 06/23/2015  . Acute respiratory failure ([place]) 06/23/2015  . Traumatic subarachnoid bleed with LOC of 6 hours to 24 hours ([place]) 06/23/2015  . Right spastic hemiparesis ([place]) 06/23/2015  . Fall 06/22/2015  . Ocular trauma of right eye 06/22/2015  . TBI (traumatic brain injury) ([place]) 06/16/2015    Willow Ora 09/16/2015, 10:30 AM  Willow Ora, PTA, [facility] [address] [place], [place] [address] [phone] 09/16/2015, 10:30 AM   Name: Kary Colaizzi MRN: [id number] Date of Birth: October 28, 1985

## 2015-09-16 NOTE — Therapy (Signed)
[facility] [address] [place], [place], [address] Phone: [phone]   Fax:  [phone]  Occupational Therapy Treatment  Patient Details  Name: Dakota [name] MRN: [id number] Date of Birth: Sep 08, 1986 No Data Recorded  Encounter Date: 09/16/2015      OT End of Session - 09/17/15 1358    Visit Number 11   Number of Visits 17   Date for OT Re-Evaluation 09/22/14   Authorization Type BCBS, awaiting for verification of benefits   Authorization Time Period week 7/8   OT Start Time 0934   OT Stop Time 1015   OT Time Calculation (min) 41 min   Activity Tolerance Patient tolerated treatment well   Behavior During Therapy [facility] for tasks assessed/performed      Past Medical History  Diagnosis Date  . Hx of renal calculi     No past surgical history on file.  There were no vitals filed for this visit.  Visit Diagnosis:  Right hemiparesis ([place])  Lack of coordination  Spasticity  Inattention      Subjective Assessment - 09/16/15 0936    Pertinent History Pt hospitalized on 06/16/15 for TBI with SAH, L eye injury (retrobulbar hemorrhage). Transferred to inpatient rehab on 10/13, where he remained until discharged home on 07/14/15.   Patient Stated Goals use RUE again   Currently in Pain? No/denies         Neurological Re-education Exercises    Shoulder Flexion  AAROM;Both;Supine  PVC frame w/ min cues/facilitation for positioning and scapular facilitation    Elbow Extension  AROM;Supine;AAROM;Right  with PVC frame (min facilitation), AROM min v.c. For wrist ext to neutral and relaxed fingers       Other Weight-Bearing Exercises 1  Wt. bearing in modified quadraped with cat/cow positions for incr scapular stability/movement and decr spasticity with min cueing/facilitation then rocking forwards and back wards.    Modalities    Modalities  Medical laboratory scientific officer Location  dorsal forearm    Electrical Stimulation Action  wrist/finger extension    Electrical Stimulation Parameters  50pps, 250 pulse width, 10 sec cycle, 2 sec ramp, intensity=19-23 with no adverse reactions while performing functional grasp release of cylindrical objects, min facilitation/ v.c  Therapist started checking progress towards goals                     OT Short Term Goals - 08/22/15 1540    OT SHORT TERM GOAL #1   Title Pt will be independent with initial HEP--check STGs 08/16/15   Time 4   Period Days   Status On-going   OT SHORT TERM GOAL #2   Title Pt will demo at least 80* R shoulder flex with minimal compensation in prep for functional reach.   Baseline 70*   Time 4   Status On-going  08/22/15  60-70* with mod compensation   OT SHORT TERM GOAL #3   Title Pt will demo at least 50% gross finger ext for grasp/release of objects.   Baseline trace   Time 4   Period Weeks   Status On-going  08/22/15  25-50% varies with spasticity   OT SHORT TERM GOAL #4   Title Pt will be able to use RUE as gross assist/stabilizer at least 25% of the time for ADLs.   Time 4   Period Weeks   Status Achieved  08/22/15:  approx this level per pt report  OT SHORT TERM GOAL #5   Title Pt will grasp/release small cylinder object with min A in 4/5 trials.   Time 4   Period Weeks   Status Achieved  08/22/15  met at approx this level   OT SHORT TERM GOAL #6   Title Pt will be independent with splint wear/care prn.   Time 4   Period Weeks   Status On-going  08/22/15 partially met only 4-6 hrs           OT Long Term Goals - 09/16/15 0951    OT LONG TERM GOAL #1   Title Pt will be independent with updated HEP.--check LTGs 09/16/15   Time 8   Period Weeks   Status On-going   OT LONG TERM GOAL #2   Title Pt will demo at least 90* R shoulder flex with minimal compensation in prep for functional reach.   Baseline 70*   Time 8    Period Weeks   Status On-going  09/16/15-80* with mod compensations   OT LONG TERM GOAL #3   Title Pt will improve RUE functional reaching and coordination as shown by scoring at least 8 on box and blocks test.   Baseline 0   Time 8   Period Weeks   Status On-going   OT LONG TERM GOAL #4   Title Pt will use RUE as gross assist/stabilizer at least 50% of the time for ADLs.   Time 8   Period Weeks   Status Achieved  met per pt. report   OT LONG TERM GOAL #5   Title Pt will demo good safety awareness during functional tasks and per family report.   Time 8   Period Weeks   Status On-going   OT LONG TERM GOAL #6   Title Pt will verbalize understanding of cognitive and visual compensation strategies prn.   Time 8   Period Weeks   Status On-going               Plan - 09/17/15 1356    Clinical Impression Statement Pt is progressing towards goals with improving LUE functional use.   Pt will benefit from skilled therapeutic intervention in order to improve on the following deficits (Retired) Decreased cognition;Decreased mobility;Decreased strength;Impaired vision/preception;Impaired UE functional use;Pain;Decreased knowledge of use of DME;Decreased balance;Impaired tone;Decreased safety awareness;Decreased coordination;Decreased range of motion;Impaired sensation   Rehab Potential Good   OT Frequency 2x / week   OT Duration 8 weeks   OT Treatment/Interventions Self-care/ADL training;Cryotherapy;Parrafin;Electrical Stimulation;Fluidtherapy;Moist Heat;Contrast Bath;Passive range of motion;Visual/perceptual remediation/compensation;Cognitive remediation/compensation;DME and/or AE instruction;Therapeutic activities;Therapeutic exercises;Neuromuscular education;Ultrasound;Manual Therapy;Splinting;Patient/family education;Therapist, nutritional;Therapeutic exercise   Plan NMR, finish checking goals   Consulted and Agree with Plan of Care Patient;Family member/caregiver   Family  Member Consulted wife        Problem List Patient Active Problem List   Diagnosis Date Noted  . 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 Urinary retention 06/23/2015  . Acute respiratory failure ([place]) 06/23/2015  . Traumatic subarachnoid bleed with LOC of 6 hours to 24 hours ([place]) 06/23/2015  . Right spastic hemiparesis ([place]) 06/23/2015  . Fall 06/22/2015  . Ocular trauma of right eye 06/22/2015  . TBI (traumatic brain injury) ([place]) 06/16/2015    RINE,KATHRYN 09/17/2015, 1:59 PM Theone Murdoch, OTR/L [phone] Phone: [phone] 1:59 PM 09/17/2015 [facility] [address] [place], [place], [address] Phone: [phone]   Fax:  [phone]  Name: Dakota [name] MRN: [id number] Date of Birth: [birth year]

## 2015-09-19 ENCOUNTER — Ambulatory Visit: Payer: BLUE CROSS/BLUE SHIELD | Admitting: Occupational Therapy

## 2015-09-19 ENCOUNTER — Ambulatory Visit: Payer: BLUE CROSS/BLUE SHIELD | Admitting: Physical Therapy

## 2015-09-23 ENCOUNTER — Ambulatory Visit: Payer: BLUE CROSS/BLUE SHIELD | Admitting: Occupational Therapy

## 2015-09-23 ENCOUNTER — Ambulatory Visit: Payer: BLUE CROSS/BLUE SHIELD | Admitting: Physical Therapy

## 2015-09-23 DIAGNOSIS — R2681 Unsteadiness on feet: Secondary | ICD-10-CM | POA: Diagnosis not present

## 2015-09-23 DIAGNOSIS — R269 Unspecified abnormalities of gait and mobility: Secondary | ICD-10-CM

## 2015-09-23 DIAGNOSIS — R279 Unspecified lack of coordination: Secondary | ICD-10-CM

## 2015-09-23 DIAGNOSIS — R41842 Visuospatial deficit: Secondary | ICD-10-CM

## 2015-09-23 DIAGNOSIS — G8191 Hemiplegia, unspecified affecting right dominant side: Secondary | ICD-10-CM

## 2015-09-23 DIAGNOSIS — R252 Cramp and spasm: Secondary | ICD-10-CM

## 2015-09-23 DIAGNOSIS — R41840 Attention and concentration deficit: Secondary | ICD-10-CM

## 2015-09-23 DIAGNOSIS — R4189 Other symptoms and signs involving cognitive functions and awareness: Secondary | ICD-10-CM

## 2015-09-23 NOTE — Patient Instructions (Addendum)
Abduction: Clam (Eccentric) - Side-Lying - RIGHT    Lie on side with knees bent. Loop GREEN Theraband around knees. Lift top knee, keeping feet together. Keep trunk steady. Slowly lower for 3-5 seconds. 10 reps per set, 2-3 sets per day, 5 days per week.   When 3 sets of 10 reps becomes too easy, progress to 3 sets of 15 reps.

## 2015-09-23 NOTE — Therapy (Addendum)
[facility] [address] [place], [place], [address] Phone: [phone]   Fax:  [phone]  Occupational Therapy Treatment  Patient Details  Name: Dakota [name] MRN: [id number] Date of Birth: Apr 20, 1986 No Data Recorded  Encounter Date: 09/23/2015      OT End of Session - 09/23/15 0938    Visit Number 12   Number of Visits 24   Date for OT Re-Evaluation 11/19/15   Authorization Type BCBS   Authorization Time Period week 8/8, next week will be week 1/12   Authorization - Visit Number 3   Authorization - Number of Visits 24   OT Start Time [phone]   OT Stop Time 1016   OT Time Calculation (min) 38 min   Activity Tolerance Patient tolerated treatment well   Behavior During Therapy [facility] for tasks assessed/performed      Past Medical History  Diagnosis Date  . Hx of renal calculi     No past surgical history on file.  There were no vitals filed for this visit.  Visit Diagnosis:  Right hemiparesis ([place]) - Plan: Ot plan of care cert/re-cert, Ot plan of care cert/re-cert  Lack of coordination - Plan: Ot plan of care cert/re-cert, Ot plan of care cert/re-cert  Spasticity - Plan: Ot plan of care cert/re-cert, Ot plan of care cert/re-cert  Inattention - Plan: Ot plan of care cert/re-cert, Ot plan of care cert/re-cert  Unsteadiness - Plan: Ot plan of care cert/re-cert, Ot plan of care cert/re-cert  Visuospatial deficit - Plan: Ot plan of care cert/re-cert, Ot plan of care cert/re-cert  Cognitive deficits - Plan: Ot plan of care cert/re-cert, Ot plan of care cert/re-cert      Subjective Assessment - 09/23/15 1231    Subjective  Pt reports trying to use his hand regularly at home   Patient is accompained by: Family member   Pertinent History Pt hospitalized on 06/16/15 for TBI with SAH, L eye injury (retrobulbar hemorrhage). Transferred to inpatient rehab on 10/13, where he remained until discharged home on 07/14/15.    Patient Stated Goals use RUE again   Currently in Pain? No/denies         Treatment:NMES 50 pps, 250 pw , 10 secs cycle x 10 mins to wrist and finger extensors while therapist  started checking progress towards goals. Standing for weightbearing through bilateral UE's on tabletop min facilitation/ v.c.with lateral weight shifts, pt practiced grasp/ release of 1 inch blocks min v.c. Therapist  checked progress towards goals. Pt wishes to continue OT 1x week due to new insurance limitations with the start of 2017.                       OT Short Term Goals - 09/23/15 0952    OT SHORT TERM GOAL #1   Title Pt will be independent with initial HEP--check STGs 08/16/15   Time --   Period Days   Status Achieved   OT SHORT TERM GOAL #2   Title Pt will demo at least 80* R shoulder flex with minimal compensation in prep for functional reach.   Baseline 70*   Time 6   Status On-going  08/22/15  80-90 with mod compensation   OT SHORT TERM GOAL #3   Title Pt will demo at least 50% gross finger ext for grasp/release of objects.   Baseline 30-50% on 09/23/15   Time 6   Period Weeks   Status On-going  09/22/14  30-50%  varies with spasticity   OT SHORT TERM GOAL #4   Title Pt will be able to use RUE as gross assist/stabilizer at least 25% of the time for ADLs.   Baseline uses grossly 50% of the time per pt report-09/23/15   Time --   Period Weeks   Status Achieved  08/22/15:  approx this level per pt report   OT SHORT TERM GOAL #5   Title Pt will grasp/release small cylinder object with min A in 4/5 trials.   Baseline Pt performs 4/5 trials- 09/23/15   Time --   Period Weeks   Status Achieved  08/22/15  met at approx this level   Additional Short Term Goals   Additional Short Term Goals Yes   OT SHORT TERM GOAL #6   Title Pt will be independent with splint wear/care prn.   Baseline wears splint 2-3 hrs at a time, not able to tolerate all night-09/23/15   Time 6   Period  Weeks   Status Partially Met  wears 2-3 hrs per day   OT SHORT TERM GOAL #7   Title Pt will increase RUE functional use as evidenced by performing 4 blocks on box/ blocks test.   Baseline 1 block, 3 blocks on 09/23/15   Time 6   Period Weeks   Status New   OT SHORT TERM GOAL #8   Title Pt will be independent with newly updated HEP.    Baseline independent with previsous HEP-09/23/15   Time 6   Period Weeks   Status New   OT SHORT TERM GOAL  #9   TITLE Pt will use RUE as a gross assist 60% of the time for ADLs/ IADLs.   Baseline uses grossly 50% of the time as gross A/ stabilizer-09/23/15   Time 6   Period Weeks   Status New           OT Long Term Goals - 09/23/15 1258    OT LONG TERM GOAL #1   Title Pt will be independent with updated HEP.--   Baseline met, 09/23/15   Time --   Period Weeks   Status Achieved   OT LONG TERM GOAL #2   Title Pt will demo at least 90* R shoulder flex with minimal compensation in prep for functional reach.   Baseline 80-90* with mod compensations-09/23/15   Time 12   Period Weeks   Status On-going  09/23/15-80-90* with mod compensations   OT LONG TERM GOAL #3   Title Pt will improve RUE functional reaching and coordination as shown by scoring at least 8 on box and blocks test.   Baseline 1,3 blocks on 09/23/15   Time 12   Period Weeks   Status On-going  09/23/15- 1, 3 blocks- avg 2 blocks   OT LONG TERM GOAL #4   Title Pt will use RUE as gross assist/stabilizer at least 50% of the time for ADLs.   Baseline met per pt report uses 50% of the time   Time --   Period Weeks   Status Achieved  met per pt. report   OT LONG TERM GOAL #5   Title Pt will demo good safety awareness during functional tasks and per family report.   Baseline Pt continues to require cueing for safety 09/23/15   Time 12   Period Weeks   Status On-going   Long Term Additional Goals   Additional Long Term Goals Yes   OT LONG TERM GOAL #6   Title  Pt will verbalize  understanding of cognitive and visual compensation strategies prn.   Baseline Pt continues to require assistance and is not consistently using strategies, Pt requires verbal cues for strategies-09/23/15   Time 12   Period Weeks   Status On-going   OT LONG TERM GOAL #7   Title Pt will use RUE as a gross A  75% of the time for ADLs/ IADLS    Baseline Pt uses grossly 50% of the time-09/23/15   Time 12   Period Weeks   Status New               Plan - 09/23/15 1249    Clinical Impression Statement Pt s/p TBI with SAH (852.0), left orbital contusion,  s/p canthotomy on 06/16/15 demonstrates overall progress. (Pt was d/c home from hospital on 07/14/15). Pt can benefit from continued skilled occupational therapy to address RUE weakness, spasticity, visual perceptual and cognitive deficits in order to maximize safety with ADLs/IADLS.   Pt will benefit from skilled therapeutic intervention in order to improve on the following deficits (Retired) Decreased cognition;Decreased mobility;Decreased strength;Impaired vision/preception;Impaired UE functional use;Pain;Decreased knowledge of use of DME;Decreased balance;Impaired tone;Decreased safety awareness;Decreased coordination;Decreased range of motion;Impaired sensation   Rehab Potential Good   OT Frequency 1x / week  starting next week   OT Duration 12 weeks   OT Treatment/Interventions Self-care/ADL training;Therapeutic exercise;Functional Mobility Training;Patient/family education;Balance training;Splinting;Manual Therapy;Neuromuscular education;Ultrasound;Iontophoresis;Energy conservation;Therapeutic exercises;Therapeutic activities;DME and/or AE instruction;Parrafin;Cryotherapy;Electrical Stimulation;Fluidtherapy;Cognitive remediation/compensation;Visual/perceptual remediation/compensation;Passive range of motion;Moist Heat;Contrast Bath   Plan Neuro re-ed to [employer] and Agree with Plan of Care Patient;Family member/caregiver   Family  Member Consulted father        Problem List Patient Active Problem List   Diagnosis Date Noted  . 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 Urinary retention 06/23/2015  . Acute respiratory failure ([place]) 06/23/2015  . Traumatic subarachnoid bleed with LOC of 6 hours to 24 hours ([place]) 06/23/2015  . Right spastic hemiparesis ([place]) 06/23/2015  . Fall 06/22/2015  . Ocular trauma of right eye 06/22/2015  . TBI (traumatic brain injury) ([place]) 06/16/2015    Sarya Linenberger 09/23/2015, 4:33 PM Theone Murdoch, OTR/L [phone] Phone: [phone] 4:33 PM 09/23/2015 [facility] [address] [place], [place], [address] Phone: [phone]   Fax:  [phone]  Name: Dakota [name] MRN: [id number] Date of Birth: [birth year]

## 2015-09-23 NOTE — Addendum Note (Signed)
Addended by: Keene BreathINE, KATHRYN B on: 09/23/2015 04:27 PM   Modules accepted: Orders

## 2015-09-24 NOTE — Therapy (Signed)
[facility] [address] [place], [place], [address] Phone: [phone]   Fax:  [phone]  Physical Therapy Treatment  Patient Details  Name: Dakota [name] MRN: [id number] Date of Birth: [birth year] Referring Provider: Dwana Melena, MD  Encounter Date: 09/23/2015      PT End of Session - 09/23/15 1256    Visit Number 14   Number of Visits 17   Date for PT Re-Evaluation 10/23/15  30 days after 09/16/15   Authorization Type [employer]   Authorization - Visit Number 4   Authorization - Number of Visits 15  Pt has total of 30 visits (combined) per year   PT Start Time 1147   PT Stop Time 1235   PT Time Calculation (min) 48 min   Equipment Utilized During Treatment Gait belt   Activity Tolerance Patient tolerated treatment well   Behavior During Therapy [facility] for tasks assessed/performed      Past Medical History  Diagnosis Date  . Hx of renal calculi     No past surgical history on file.  There were no vitals filed for this visit.  Visit Diagnosis:  Abnormality of gait - Plan: PT plan of care cert/re-cert  Right hemiparesis ([place]) - Plan: PT plan of care cert/re-cert      Subjective Assessment - 09/23/15 1313    Subjective Pt reports no significant pain in R hip. States, "I can tell that I can't move my right leg in all the ways I can move my left leg."   Pertinent History SAH (06/16/15); L eye contusion, retrobulbar hemorrhage s/p canthotomy   Patient Stated Goals "Just to walk better; walk without the cane or the brace and to use the arm again."   Currently in Pain? No/denies   Pain Score 0-No pain                                 PT Education - 09/24/15 1153    Education provided Yes   Education Details PT goals, progress, and POC. HEP: hip abduction strengthening progressed.   Person(s) Educated Patient;Parent(s)   Methods Explanation;Demonstration;Verbal cues;Handout   Comprehension Verbalized understanding;Returned demonstration          PT Short Term Goals - 09/24/15 1142    PT SHORT TERM GOAL #1   Title Pt will perform initial HEP with mod I using paper handout to maximize functional gains made in PT. Target date: 08/15/15   Baseline Met 11/23.   Status Achieved   PT SHORT TERM GOAL #2   Title Pt will negotiate 9 stairs with single rail and mod I (no cueing for safety awareness) to indicate safety using primary home entrance. Target date: 08/15/15   Baseline Achieved: 08/17/15. Pt negotiated 12 stairs mod I.   Status Achieved   PT SHORT TERM GOAL #3   Title Pt will increase gait velocity from 1.97 ft/sec to 2.27 ft/sec to indicate increase efficiency of ambulation. Target date: 08/15/15   Baseline Achieved: 08/17/15. Pt achieve 2.65f/sec gait speed.   Status Achieved   PT SHORT TERM GOAL #4   Title Pt will independently ambulate 200' without AD using AFO to progress toward PLOF and pt-stated goal of independent ambulation. Target date: 08/15/15   Baseline Achieved: 08/17/15. Pt I with ambulating 2034fwithout AD with AFO on indoor, level surfaces.   Status Achieved   PT SHORT TERM GOAL #5   Title Pt will  ambulate 500' over unlevel, paved surfaces with mod I using LRAD to indicate increased safety with limited community mobility. Target date: 08/15/15   Baseline Met 1/13.   Status Achieved   PT SHORT TERM GOAL #6   Title Complete DGI to assess dynamic gait stability. Target date: 08/15/15   Baseline 11/18: DGI score = 13/24   Status Achieved   PT SHORT TERM GOAL #7   Title Pt will improve 6MWT distance from 1,022' to 1,079' to indicate improved functional endurance.Target date: 09/12/15   Status Achieved   PT SHORT TERM GOAL #8   Title Pt will independently ambulate 1,000' over unlevel, paved surfaces with no device/AFO and no evidence of Trendelenburg gait pattern to decrease R hip pain with walking prolonged distances. Target date: 10/14/15.   Status New            PT Long Term Goals - 09/24/15 1143    PT LONG TERM GOAL #1   Title Pt will increase gait velocity from 1.97 ft/sec to > 2.62 ft/sec to indicate status of community ambulator. Target date: 09/12/15   Baseline 09/14/15: 3.5 ft/sec without brace/AD   Status Achieved   PT LONG TERM GOAL #2   Title Pt will score >/= 20/24 on DGI to indicate decreased risk of falling. Target date: 09/12/15   Baseline 1/13: DGI score = 23/24.   Status Achieved   PT LONG TERM GOAL #3   Title Pt will independently negotiate 9 stairs with single rail and reciprocal pattern to indicate pt independence using primary home entrance. Target date: 09/12/15   Baseline met on 09/14/15 with mild recurvatum noted without brace   Status Achieved   PT LONG TERM GOAL #4   Title Pt will independently ambulate 200' over level, indoor surfaces without assistive device or AFO to progress toward PLOF as independent with household ambulation. Target date: 09/12/15   Baseline met on 09/14/15   Status Achieved   PT LONG TERM GOAL #5   Title Pt will ambulate >1,000' over outdoor, paved surfaces with mod I using LRAD to indicate increased independence with commnuity mobility. Target date: 09/12/15   Baseline 09/14/15: without brace/AD   Status Achieved   Additional Long Term Goals   Additional Long Term Goals Yes   PT LONG TERM GOAL #6   Title Pt will negotiate standard ramp and curb step with mod I using LRAD to indicate safety traversing community obstacles. Target date: 09/12/15   Baseline Met 1/13.   Status Achieved               Plan - 09/24/15 1148    Clinical Impression Statement Pt is a 30 y/o M s/p TBI with SAH (852.0), left orbital contusion, s/p canthotomy on 06/16/15 (pt discharged home from hospital on 07/14/15). Pt has made significant progress with stability/independence with functional mobility, as exhibited by pt having met all short and long term goals. Pt also scored 28/30 on FGA, suggesting pt is no longer at  risk of falling. Pt does continue to exhibit R Trendelenburg gait pattern with increase distance ambulation and does report intermittent pain in lateral sapect of R hip. Therefore, pt will benefit from skilled outpatient Pt 1x/week for 3 additional weeks to increase endurance in R gluteus maximus/medius to normalize gait pattern and prevent R hip pain. Pt/father verbalized understanding and were in full agreement with POC.   Pt will benefit from skilled therapeutic intervention in order to improve on the following deficits Abnormal gait;Decreased cognition;Decreased  balance;Decreased strength;Decreased safety awareness;Impaired vision/preception;Impaired UE functional use;Impaired tone   Rehab Potential Good   Clinical Impairments Affecting Rehab Potential decreased safety awareness; impaired vision in L eye   PT Frequency 2x / week   PT Duration 8 weeks   PT Treatment/Interventions ADLs/Self Care Home Management;Electrical Stimulation;DME Instruction;Gait training;Stair training;Functional mobility training;Therapeutic activities;Therapeutic exercise;Balance training;Neuromuscular re-education;Vestibular;Visual/perceptual remediation/compensation;Patient/family education   PT Next Visit Plan Focus on R hip stability during gait. Progressing HEP to increase endurance of R gluteus medius/maximus.   Consulted and Agree with Plan of Care Patient;Family member/caregiver   Family Member Consulted dad        Problem List Patient Active Problem List   Diagnosis Date Noted  . 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 Urinary retention 06/23/2015  . Acute respiratory failure ([place]) 06/23/2015  . Traumatic subarachnoid bleed with LOC of 6 hours to 24 hours ([place]) 06/23/2015  . Right spastic hemiparesis ([place]) 06/23/2015  . Fall 06/22/2015  . Ocular trauma of right eye 06/22/2015  . TBI (traumatic brain injury) ([place]) 06/16/2015    Billie Ruddy, PT, [facility] [address] [place], [place], [address] Phone: [phone]   Fax:  [phone] 09/24/2015, 12:04 PM  Name: Dakota [name] MRN: [id number] Date of Birth: [birth year]

## 2015-09-26 ENCOUNTER — Ambulatory Visit: Payer: BLUE CROSS/BLUE SHIELD | Admitting: Physical Therapy

## 2015-09-26 ENCOUNTER — Ambulatory Visit: Payer: BLUE CROSS/BLUE SHIELD | Admitting: Occupational Therapy

## 2015-09-30 ENCOUNTER — Ambulatory Visit: Payer: BLUE CROSS/BLUE SHIELD | Admitting: Physical Therapy

## 2015-09-30 ENCOUNTER — Encounter: Payer: Self-pay | Admitting: Physical Therapy

## 2015-09-30 ENCOUNTER — Ambulatory Visit: Payer: BLUE CROSS/BLUE SHIELD | Admitting: Occupational Therapy

## 2015-09-30 DIAGNOSIS — R2681 Unsteadiness on feet: Secondary | ICD-10-CM | POA: Diagnosis not present

## 2015-09-30 DIAGNOSIS — R279 Unspecified lack of coordination: Secondary | ICD-10-CM

## 2015-09-30 DIAGNOSIS — R41840 Attention and concentration deficit: Secondary | ICD-10-CM

## 2015-09-30 DIAGNOSIS — R252 Cramp and spasm: Secondary | ICD-10-CM

## 2015-09-30 DIAGNOSIS — R52 Pain, unspecified: Secondary | ICD-10-CM

## 2015-09-30 DIAGNOSIS — G8191 Hemiplegia, unspecified affecting right dominant side: Secondary | ICD-10-CM

## 2015-09-30 DIAGNOSIS — R4189 Other symptoms and signs involving cognitive functions and awareness: Secondary | ICD-10-CM

## 2015-09-30 DIAGNOSIS — R269 Unspecified abnormalities of gait and mobility: Secondary | ICD-10-CM

## 2015-09-30 NOTE — Therapy (Signed)
[facility] [address] [place], [place], [address] Phone: [phone]   Fax:  [phone]  Occupational Therapy Treatment  Patient Details  Name: Dakota [name] MRN: [id number] Date of Birth: 04-28-1986 No Data Recorded  Encounter Date: 09/30/2015      OT End of Session - 09/30/15 1522    Visit Number 13   Number of Visits 24   Authorization Time Period  week 1/12   Authorization - Visit Number 4   Authorization - Number of Visits 15  pt may have additional visits leftover if PT does not use 15   OT Start Time 0935   OT Stop Time 1015   OT Time Calculation (min) 40 min   Activity Tolerance Patient tolerated treatment well   Behavior During Therapy [facility] for tasks assessed/performed      Past Medical History  Diagnosis Date  . Hx of renal calculi     No past surgical history on file.  There were no vitals filed for this visit.  Visit Diagnosis:  Right hemiparesis ([place])  Lack of coordination  Spasticity  Inattention  Cognitive deficits      Subjective Assessment - 09/30/15 1005    Subjective  Pt reports he has been trying to use right hand at home   Pertinent History Pt hospitalized on 06/16/15 for TBI with SAH, L eye injury (retrobulbar hemorrhage). Transferred to inpatient rehab on 10/13, where he remained until discharged home on 07/14/15.   Patient Stated Goals use RUE again   Currently in Pain? No/denies        Treatment: supine closed chain shoulder flexion, then midrange shoulder flexion with min facilitation for elbow extension and control. Quadraped with RUE over dome, cat and cow, rocking side to side and forwards and back for increased weight bearing through RUE and core stability, min facilitation/ v.c. Dynamic open chain reaching in standing midlevel, with min-mod facilitation. NMES 50 pps, 250 pw, 10 secs cycle, intensity 20 x 15 mins while pt picked up 1 inch blocks to place in a  container, min facilitation/ v.c..                        OT Short Term Goals - 09/23/15 0952    OT SHORT TERM GOAL #1   Title Pt will be independent with initial HEP--check STGs 08/16/15   Time --   Period Days   Status Achieved   OT SHORT TERM GOAL #2   Title Pt will demo at least 80* R shoulder flex with minimal compensation in prep for functional reach.   Baseline 70*   Time 6   Status On-going  08/22/15  80-90 with mod compensation   OT SHORT TERM GOAL #3   Title Pt will demo at least 50% gross finger ext for grasp/release of objects.   Baseline 30-50% on 09/23/15   Time 6   Period Weeks   Status On-going  09/22/14  30-50% varies with spasticity   OT SHORT TERM GOAL #4   Title Pt will be able to use RUE as gross assist/stabilizer at least 25% of the time for ADLs.   Baseline uses grossly 50% of the time per pt report-09/23/15   Time --   Period Weeks   Status Achieved  08/22/15:  approx this level per pt report   OT SHORT TERM GOAL #5   Title Pt will grasp/release small cylinder object with min A in 4/5 trials.  Baseline Pt performs 4/5 trials- 09/23/15   Time --   Period Weeks   Status Achieved  08/22/15  met at approx this level   Additional Short Term Goals   Additional Short Term Goals Yes   OT SHORT TERM GOAL #6   Title Pt will be independent with splint wear/care prn.   Baseline wears splint 2-3 hrs at a time, not able to tolerate all night-09/23/15   Time 6   Period Weeks   Status Partially Met  wears 2-3 hrs per day   OT SHORT TERM GOAL #7   Title Pt will increase RUE functional use as evidenced by performing 4 blocks on box/ blocks test.   Baseline 1 block, 3 blocks on 09/23/15   Time 6   Period Weeks   Status New   OT SHORT TERM GOAL #8   Title Pt will be independent with newly updated HEP.    Baseline independent with previsous HEP-09/23/15   Time 6   Period Weeks   Status New   OT SHORT TERM GOAL  #9   TITLE Pt will use RUE as a  gross assist 60% of the time for ADLs/ IADLs.   Baseline uses grossly 50% of the time as gross A/ stabilizer-09/23/15   Time 6   Period Weeks   Status New           OT Long Term Goals - 09/23/15 1258    OT LONG TERM GOAL #1   Title Pt will be independent with updated HEP.--   Baseline met, 09/23/15   Time --   Period Weeks   Status Achieved   OT LONG TERM GOAL #2   Title Pt will demo at least 90* R shoulder flex with minimal compensation in prep for functional reach.   Baseline 80-90* with mod compensations-09/23/15   Time 12   Period Weeks   Status On-going  09/23/15-80-90* with mod compensations   OT LONG TERM GOAL #3   Title Pt will improve RUE functional reaching and coordination as shown by scoring at least 8 on box and blocks test.   Baseline 1,3 blocks on 09/23/15   Time 12   Period Weeks   Status On-going  09/23/15- 1, 3 blocks- avg 2 blocks   OT LONG TERM GOAL #4   Title Pt will use RUE as gross assist/stabilizer at least 50% of the time for ADLs.   Baseline met per pt report uses 50% of the time   Time --   Period Weeks   Status Achieved  met per pt. report   OT LONG TERM GOAL #5   Title Pt will demo good safety awareness during functional tasks and per family report.   Baseline Pt continues to require cueing for safety 09/23/15   Time 12   Period Weeks   Status On-going   Long Term Additional Goals   Additional Long Term Goals Yes   OT LONG TERM GOAL #6   Title Pt will verbalize understanding of cognitive and visual compensation strategies prn.   Baseline Pt continues to require assistance and is not consistently using strategies, Pt requires verbal cues for strategies-09/23/15   Time 12   Period Weeks   Status On-going   OT LONG TERM GOAL #7   Title Pt will use RUE as a gross A  75% of the time for ADLs/ IADLS    Baseline Pt uses grossly 50% of the time-09/23/15   Time 12   Period Weeks  Status New               Plan - 09/30/15 1006     Clinical Impression Statement Pt is progressing towards goals he reports increased finger movement with improved ability to use at home.   Pt will benefit from skilled therapeutic intervention in order to improve on the following deficits (Retired) Decreased cognition;Decreased mobility;Decreased strength;Impaired vision/preception;Impaired UE functional use;Pain;Decreased knowledge of use of DME;Decreased balance;Impaired tone;Decreased safety awareness;Decreased coordination;Decreased range of motion;Impaired sensation   Rehab Potential Good   OT Frequency Other (comment)  20 visits   OT Duration 12 weeks   OT Treatment/Interventions Self-care/ADL training;Therapeutic exercise;Functional Mobility Training;Patient/family education;Balance training;Splinting;Manual Therapy;Neuromuscular education;Ultrasound;Iontophoresis;Energy conservation;Therapeutic exercises;Therapeutic activities;DME and/or AE instruction;Parrafin;Cryotherapy;Electrical Stimulation;Fluidtherapy;Cognitive remediation/compensation;Visual/perceptual remediation/compensation;Passive range of motion;[employer] banker, operational re-ed   Consulted and Agree with Plan of Care Patient;Family member/caregiver   Family Member Consulted father        Problem List Patient Active Problem List   Diagnosis Date Noted  . 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 Urinary retention 06/23/2015  . Acute respiratory failure ([place]) 06/23/2015  . Traumatic subarachnoid bleed with LOC of 6 hours to 24 hours ([place]) 06/23/2015  . Right spastic hemiparesis ([place]) 06/23/2015  . Fall 06/22/2015  . Ocular trauma of right eye 06/22/2015  . TBI (traumatic brain injury) ([place]) 06/16/2015    Dakota [name] 09/30/2015, 3:25 PM Theone Murdoch, OTR/L [phone] Phone: [phone] 3:25 PM 09/30/2015 [place] [address] [place], [place], [address] Phone: [phone]   Fax:   [phone]  Name: Dakota [name] MRN: [id number] Date of Birth: 05/11/1986

## 2015-09-30 NOTE — Patient Instructions (Signed)
Bent Knee Lift (Prone)    Abdomen and head supported, bend right knee and slowly raise hip. Avoid arching low back. Hold for 3-5 seconds.  Repeat _10___ times per set. 2 sets.  Do __1-2__ sessions per day.  http://orth.exer.us/1110   Copyright  VHI. All rights reserved.  Straight Leg Raise (Prone)    Abdomen and head supported, keep left knee locked and raise leg at hip. Avoid arching low back. Repeat with right leg, alternating legs. Repeat _10__ times each leg. 2 sets.  Do __1-2__ sessions per day.  http://orth.exer.us/1112   Copyright  VHI. All rights reserved.  Heel Squeeze (Prone)    Abdomen supported, bend knees and gently squeeze heels together. Hold __5__ seconds. Repeat __10__ times per set. Do _2_ sets per session. Do __1-2__ sessions per day.  http://orth.exer.us/1080   Copyright  VHI. All rights reserved.  Strengthening: Wall Slide    Leaning on wall, slowly lower buttocks until thighs are parallel to floor. Hold ___5_ seconds. Tighten thigh muscles and return. Repeat __10__ times per set. Do __1-2_ sets per session. Do __1-2__ sessions per day.  http://orth.exer.us/630   Copyright  VHI. All rights reserved.  Band Walk: Side Stepping    Tie band around legs, just above knees. Step to one side, then step back to start. Repeat 4-5 laps each way. Be sure to lift feet off floor. Note: Small towel between band and skin eases rubbing.  http://plyo.exer.us/76   Copyright  VHI. All rights reserved.  Crab Walking    In squatting position with band tied around legs above knees, in squat position walk forward and then backwards.  Perform _2-4_ laps each way.  1-2 times a day.  Copyright  VHI. All rights reserved.  Diagonal Lunge Walking (electic slide)    With band tied around legs just above knees. In squat position: Start with feet together. Step right leg forward and out, then bring left leg to first one. Step left leg forward and out, bring  right leg to it. Repeat this pattern while walking forward to end of lap. Then, go backwards using same pattern: take right leg backwards and out, bring left leg to it. Take left leg backwards and out, bring right one to it. Try and stay in squat position through out laps. Perform 2-4 laps each way. 1-2 times a day.   Copyright  VHI. All rights reserved.

## 2015-09-30 NOTE — Therapy (Signed)
[facility] [address] [place], [place], [address] Phone: [phone]   Fax:  [phone]  Physical Therapy Treatment  Patient Details  Name: Dakota [name] MRN: [id number] Date of Birth: [birth year] Referring Provider: Dwana Melena, MD  Encounter Date: 09/30/2015    09/30/15 1020  PT Visits / Re-Eval  Visit Number 15  Number of Visits 17  Date for PT Re-Evaluation 10/23/15 (30 days after 09/16/15)  [place] - Visit Number 5  Authorization - Number of Visits 15 (Pt has total of 30 visits (combined) per year)  PT Time Calculation  PT Start Time 1017  PT Stop Time 1100  PT Time Calculation (min) 43 min  PT - End of Session  Equipment Utilized During Treatment Gait belt  Activity Tolerance Patient tolerated treatment well  Behavior During Therapy [facility] for tasks assessed/performed    Past Medical History  Diagnosis Date  . Hx of renal calculi     History reviewed. No pertinent past surgical history.  There were no vitals filed for this visit.  Visit Diagnosis:  Lack of coordination     Abnormality of gait     Unsteadiness     Right hemiparesis (HCC)     Pain          Subjective Assessment - 09/30/15 1019    Subjective No new complaints. No falls or pain to report.   Patient is accompained by: Family member  dad   Pertinent History SAH (06/16/15); L eye contusion, retrobulbar hemorrhage s/p canthotomy   Patient Stated Goals "Just to walk better; walk without the cane or the brace and to use the arm again."   Currently in Pain? No/denies   Pain Score 0-No pain     Treatment: Exercises Reviewed current HEP for hip and glut strengthening. Provided cues on form and technique.  Educated pt on new exercises for home for hip/glut/LE strengthening. Refer to pt instructions for full details.          PT Short Term Goals - 09/24/15 1142    PT  SHORT TERM GOAL #1   Title Pt will perform initial HEP with mod I using paper handout to maximize functional gains made in PT. Target date: 08/15/15   Baseline Met 11/23.   Status Achieved   PT SHORT TERM GOAL #2   Title Pt will negotiate 9 stairs with single rail and mod I (no cueing for safety awareness) to indicate safety using primary home entrance. Target date: 08/15/15   Baseline Achieved: 08/17/15. Pt negotiated 12 stairs mod I.   Status Achieved   PT SHORT TERM GOAL #3   Title Pt will increase gait velocity from 1.97 ft/sec to 2.27 ft/sec to indicate increase efficiency of ambulation. Target date: 08/15/15   Baseline Achieved: 08/17/15. Pt achieve 2.54f/sec gait speed.   Status Achieved   PT SHORT TERM GOAL #4   Title Pt will independently ambulate 200' without AD using AFO to progress toward PLOF and pt-stated goal of independent ambulation. Target date: 08/15/15   Baseline Achieved: 08/17/15. Pt I with ambulating 2087fwithout AD with AFO on indoor, level surfaces.   Status Achieved   PT SHORT TERM GOAL #5   Title Pt will ambulate 500' over unlevel, paved surfaces with mod I using LRAD to indicate increased safety with limited community mobility. Target date: 08/15/15   Baseline Met 1/13.   Status Achieved   PT SHORT TERM GOAL #6  Title Complete DGI to assess dynamic gait stability. Target date: 08/15/15   Baseline 11/18: DGI score = 13/24   Status Achieved   PT SHORT TERM GOAL #7   Title Pt will improve 6MWT distance from 1,022' to 1,079' to indicate improved functional endurance.Target date: 09/12/15   Status Achieved   PT SHORT TERM GOAL #8   Title Pt will independently ambulate 1,000' over unlevel, paved surfaces with no device/AFO and no evidence of Trendelenburg gait pattern to decrease R hip pain with walking prolonged distances. Target date: 10/14/15.   Status New           PT Long Term Goals - 09/24/15 1143    PT LONG TERM GOAL #1   Title Pt will increase gait velocity  from 1.97 ft/sec to > 2.62 ft/sec to indicate status of community ambulator. Target date: 09/12/15   Baseline 09/14/15: 3.5 ft/sec without brace/AD   Status Achieved   PT LONG TERM GOAL #2   Title Pt will score >/= 20/24 on DGI to indicate decreased risk of falling. Target date: 09/12/15   Baseline 1/13: DGI score = 23/24.   Status Achieved   PT LONG TERM GOAL #3   Title Pt will independently negotiate 9 stairs with single rail and reciprocal pattern to indicate pt independence using primary home entrance. Target date: 09/12/15   Baseline met on 09/14/15 with mild recurvatum noted without brace   Status Achieved   PT LONG TERM GOAL #4   Title Pt will independently ambulate 200' over level, indoor surfaces without assistive device or AFO to progress toward PLOF as independent with household ambulation. Target date: 09/12/15   Baseline met on 09/14/15   Status Achieved   PT LONG TERM GOAL #5   Title Pt will ambulate >1,000' over outdoor, paved surfaces with mod I using LRAD to indicate increased independence with commnuity mobility. Target date: 09/12/15   Baseline 09/14/15: without brace/AD   Status Achieved   Additional Long Term Goals   Additional Long Term Goals Yes   PT LONG TERM GOAL #6   Title Pt will negotiate standard ramp and curb step with mod I using LRAD to indicate safety traversing community obstacles. Target date: 09/12/15   Baseline Met 1/13.   Status Achieved        09/30/15 1020  Plan  Clinical Impression Statement Skilled session focued on LE strengthening today and advancing pt's current HEP. Pt able to perform new exercises in session without any issues reported. Pt making steady progress toward goals.  Pt will benefit from skilled therapeutic intervention in order to improve on the following deficits Abnormal gait;Decreased cognition;Decreased balance;Decreased strength;Decreased safety awareness;Impaired vision/preception;Impaired UE functional use;Impaired tone  Rehab Potential  Good  Clinical Impairments Affecting Rehab Potential decreased safety awareness; impaired vision in L eye  PT Frequency 2x / week  PT Duration 8 weeks  PT Treatment/Interventions ADLs/Self Care Home Management;Electrical Stimulation;DME Instruction;Gait training;Stair training;Functional mobility training;Therapeutic activities;Therapeutic exercise;Balance training;Neuromuscular re-education;Vestibular;Visual/perceptual remediation/compensation;Patient/family education  PT Next Visit Plan Review HEP issued last session. Focus on R hip stability during gait. Progressing HEP to increase endurance of R gluteus medius/maximus.Prepare for discharge in next 1-2 visits.  Consulted and Agree with Plan of Care Patient;Family member/caregiver  Family Member Consulted dad        Problem List Patient Active Problem List   Diagnosis Date Noted  . 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 Urinary retention 06/23/2015  . Acute respiratory failure ([place]) 06/23/2015  . Traumatic subarachnoid bleed with  LOC of 6 hours to 24 hours ([place]) 06/23/2015  . Right spastic hemiparesis ([place]) 06/23/2015  . Fall 06/22/2015  . Ocular trauma of right eye 06/22/2015  . TBI (traumatic brain injury) [facility]) 06/16/2015    Willow Ora 09/30/2015, 10:21 AM  Willow Ora, PTA, [facility] [address] [place], [place] [address] [phone] 10/01/2015, 8:18 PM   Name: Dakota [name] MRN: [id number] Date of Birth: August 26, 1986

## 2015-10-03 ENCOUNTER — Encounter: Payer: 59 | Admitting: Occupational Therapy

## 2015-10-03 ENCOUNTER — Ambulatory Visit: Payer: 59 | Admitting: Physical Therapy

## 2015-10-07 ENCOUNTER — Ambulatory Visit: Payer: BLUE CROSS/BLUE SHIELD | Admitting: Physical Therapy

## 2015-10-07 ENCOUNTER — Ambulatory Visit: Payer: BLUE CROSS/BLUE SHIELD | Admitting: Occupational Therapy

## 2015-10-07 DIAGNOSIS — R279 Unspecified lack of coordination: Secondary | ICD-10-CM

## 2015-10-07 DIAGNOSIS — R4189 Other symptoms and signs involving cognitive functions and awareness: Secondary | ICD-10-CM

## 2015-10-07 DIAGNOSIS — R2681 Unsteadiness on feet: Secondary | ICD-10-CM | POA: Diagnosis not present

## 2015-10-07 DIAGNOSIS — G8191 Hemiplegia, unspecified affecting right dominant side: Secondary | ICD-10-CM

## 2015-10-07 DIAGNOSIS — R252 Cramp and spasm: Secondary | ICD-10-CM

## 2015-10-07 DIAGNOSIS — R269 Unspecified abnormalities of gait and mobility: Secondary | ICD-10-CM

## 2015-10-07 DIAGNOSIS — R52 Pain, unspecified: Secondary | ICD-10-CM

## 2015-10-07 DIAGNOSIS — R41840 Attention and concentration deficit: Secondary | ICD-10-CM

## 2015-10-07 NOTE — Patient Instructions (Addendum)
Abduction: Clam (Eccentric) - Side-Lying - RIGHT    Lie on side with knees bent. Loop GREEN Theraband around knees. Lift top knee, keeping feet together. Keep trunk steady. Slowly lower for 3-5 seconds. 20 reps per set, 3 sets per day, 5 days per week.   When 3 sets of 10 reps becomes too easy, progress to 3 sets of 15 reps.   Bridging (Single Leg) - RIGHT    Lie on back with feet shoulder width apart and left leg straight. Use your RIGHT leg to lift hips toward the ceiling while keeping leg straight. Hold __1-2__ seconds. Repeat __12__ times. Progress to 15 reps when this gets easier.  Do _3___ sessions per day.SINGLE LIMB STANCE   Stand facing your countertop (but don't hold on unless you need to). Start by standing on your right leg, then (without standing on both legs), quickly transition to your leg left. Make sure your right knee isn't snapping back into hyperextension. Perform 20 reps, 2-3 times per day.

## 2015-10-07 NOTE — Therapy (Signed)
[facility] [address] [place], [place], [address] Phone: [phone]   Fax:  [phone]  Physical Therapy Treatment  Patient Details  Name: Dakota [name] MRN: [id number] Date of Birth: 04-Apr-1986 Referring Provider: Dwana Melena, MD  Encounter Date: 10/07/2015      PT End of Session - 10/07/15 1138    Visit Number 16   Number of Visits 17   Date for PT Re-Evaluation 10/23/15  Per [employer] approval   Authorization Type BCBS primary; Medicaid secondary   Authorization Time Period [employer] approved 4 visits from 1/19 - 3/15.   Authorization - Visit Number 2   Authorization - Number of Visits 4   PT Start Time 1020   PT Stop Time 1101   PT Time Calculation (min) 41 min   Equipment Utilized During Treatment Gait belt   Activity Tolerance Patient tolerated treatment well   Behavior During Therapy WFL for tasks assessed/performed      Past Medical History  Diagnosis Date  . Hx of renal calculi     No past surgical history on file.  There were no vitals filed for this visit.  Visit Diagnosis:  Abnormality of gait  Right hemiparesis ([place])  Lack of coordination      Subjective Assessment - 10/07/15 1021    Subjective Pt has been performing single leg bridges, clamshells, and walking about 1.5 mimles per day at home. Pt denies falls and reports no significant changes. Pt states, "I tried to run the other day. It didn't work too well. Kept tripping over my right leg."   Patient is accompained by: Family member  dad   Pertinent History SAH (06/16/15); L eye contusion, retrobulbar hemorrhage s/p canthotomy   Patient Stated Goals "Just to walk better; walk without the cane or the brace and to use the arm again."   Currently in Pain? No/denies                         [facility] Adult PT Treatment/Exercise - 10/07/15 0001    Ambulation/Gait   Ambulation/Gait Yes   Ambulation/Gait Assistance 6: Modified  independent (Device/Increase time)   Ambulation/Gait Assistance Details Gait x650' over unlevel, outdoor surfaces with no AD, no R AFO with noted decrease in R stance stability (mild R Trendelenburg; limited R hip extension in R terminal stance) when pt ambulated >300'. Also noted R genu recurvatum with surface changes, step ups, and step downs.   Ambulation Distance (Feet) 650 Feet   Assistive device None   Gait Pattern Step-through pattern;Decreased arm swing - right;Trendelenburg;Right genu recurvatum   Ambulation Surface Unlevel;Outdoor;Paved;Grass   Curb 6: Modified independent (Device/increase time)  noted intermittent R genu recurvatum   Gait Comments Due to decreased R knee control (pt locking out into R genu recurvatum rather than eccentrically loading R quadriceps), performed NMR described below, then progressed to linear skipping x50' with min guard, cueing to increase forward weight shift (due to pt tendency toward excessive lateral movement) and to bend R knee to eccentrically load RLE. During initial trial, noted poor coordination of RLE movement, intermittent R toe catch, and B feet frequently colliding. After 50' x8 trials, noted significant improvement in R knee control during skipping exercise.   Neuro Re-ed    Neuro Re-ed Details  Pt performed the following at parallel bars with min guard: BLE jumping on trampoline x30 reps; hopping from RLE to LLE on trampoline 2 x15 reps; lateral hopping from RLE  to LLE 2 x20 reps, progressing from BUE support > no UE support; A/P hopping from RLE to LLE on solid ground x20 reps with each LE leading; A/P hopping on from RLE to LLE onto 4" step 2 x10 reps with each LE leading, then hopping from 4" step x10 reps with each LE leading. Activities focused on dynamic R single limb stance stability/control. Cueing focused on eccentric loading of R quadriceps (rather than locking R knee in recurvatum) with noted within-session improvement.   Exercises    Exercises Other Exercises   Other Exercises  Reviewed HEP from previous session. Decreased exercises to 3 total to promote consistent compliance. Progressed exercises as appropriate and added standing, hopping laterally from RLE to LLE while standing at countertop with effective return demo from pt. See Pt Instructions for details on all exercises and reps (pt performed 1 set of RLE bridging and clamshells with Tband).                PT Education - 10/07/15 1110    Education provided Yes   Education Details HEP progressed; see Pt Instructions.   Person(s) Educated Patient;Parent(s)   Methods Explanation;Demonstration;Verbal cues;Handout   Comprehension Verbalized understanding;Returned demonstration          PT Short Term Goals - 09/24/15 1142    PT SHORT TERM GOAL #1   Title Pt will perform initial HEP with mod I using paper handout to maximize functional gains made in PT. Target date: 08/15/15   Baseline Met 11/23.   Status Achieved   PT SHORT TERM GOAL #2   Title Pt will negotiate 9 stairs with single rail and mod I (no cueing for safety awareness) to indicate safety using primary home entrance. Target date: 08/15/15   Baseline Achieved: 08/17/15. Pt negotiated 12 stairs mod I.   Status Achieved   PT SHORT TERM GOAL #3   Title Pt will increase gait velocity from 1.97 ft/sec to 2.27 ft/sec to indicate increase efficiency of ambulation. Target date: 08/15/15   Baseline Achieved: 08/17/15. Pt achieve 2.47f/sec gait speed.   Status Achieved   PT SHORT TERM GOAL #4   Title Pt will independently ambulate 200' without AD using AFO to progress toward PLOF and pt-stated goal of independent ambulation. Target date: 08/15/15   Baseline Achieved: 08/17/15. Pt I with ambulating 2056fwithout AD with AFO on indoor, level surfaces.   Status Achieved   PT SHORT TERM GOAL #5   Title Pt will ambulate 500' over unlevel, paved surfaces with mod I using LRAD to indicate increased safety with limited  community mobility. Target date: 08/15/15   Baseline Met 1/13.   Status Achieved   PT SHORT TERM GOAL #6   Title Complete DGI to assess dynamic gait stability. Target date: 08/15/15   Baseline 11/18: DGI score = 13/24   Status Achieved   PT SHORT TERM GOAL #7   Title Pt will improve 6MWT distance from 1,022' to 1,079' to indicate improved functional endurance.Target date: 09/12/15   Status Achieved   PT SHORT TERM GOAL #8   Title Pt will independently ambulate 1,000' over unlevel, paved surfaces with no device/AFO and no evidence of Trendelenburg gait pattern to decrease R hip pain with walking prolonged distances. Target date: 10/14/15.   Status New           PT Long Term Goals - 09/24/15 1143    PT LONG TERM GOAL #1   Title Pt will increase gait velocity from 1.97 ft/sec  to > 2.62 ft/sec to indicate status of community ambulator. Target date: 09/12/15   Baseline 09/14/15: 3.5 ft/sec without brace/AD   Status Achieved   PT LONG TERM GOAL #2   Title Pt will score >/= 20/24 on DGI to indicate decreased risk of falling. Target date: 09/12/15   Baseline 1/13: DGI score = 23/24.   Status Achieved   PT LONG TERM GOAL #3   Title Pt will independently negotiate 9 stairs with single rail and reciprocal pattern to indicate pt independence using primary home entrance. Target date: 09/12/15   Baseline met on 09/14/15 with mild recurvatum noted without brace   Status Achieved   PT LONG TERM GOAL #4   Title Pt will independently ambulate 200' over level, indoor surfaces without assistive device or AFO to progress toward PLOF as independent with household ambulation. Target date: 09/12/15   Baseline met on 09/14/15   Status Achieved   PT LONG TERM GOAL #5   Title Pt will ambulate >1,000' over outdoor, paved surfaces with mod I using LRAD to indicate increased independence with commnuity mobility. Target date: 09/12/15   Baseline 09/14/15: without brace/AD   Status Achieved   Additional Long Term Goals    Additional Long Term Goals Yes   PT LONG TERM GOAL #6   Title Pt will negotiate standard ramp and curb step with mod I using LRAD to indicate safety traversing community obstacles. Target date: 09/12/15   Baseline Met 1/13.   Status Achieved               Plan - 10/07/15 1141    Clinical Impression Statement Session focused on progressing HEP and increasing RLE motor control/coordination during eccentric loading of R quadriceps. Note that patient continues to lock RLE into genu recurvatum when stepping up/down and when ambulating over change in surface. Lateral stability of L hip also limited over varying surfaces when pt fatigued.    Pt will benefit from skilled therapeutic intervention in order to improve on the following deficits Abnormal gait;Decreased cognition;Decreased balance;Decreased strength;Decreased safety awareness;Impaired vision/preception;Impaired UE functional use;Impaired tone   Rehab Potential Good   Clinical Impairments Affecting Rehab Potential decreased safety awareness; impaired vision in L eye   PT Frequency 2x / week   PT Duration 8 weeks   PT Treatment/Interventions ADLs/Self Care Home Management;Electrical Stimulation;DME Instruction;Gait training;Stair training;Functional mobility training;Therapeutic activities;Therapeutic exercise;Balance training;Neuromuscular re-education;Vestibular;Visual/perceptual remediation/compensation;Patient/family education   PT Next Visit Plan Progress brief HEP. Check remaining goals, and DC.   Consulted and Agree with Plan of Care Patient;Family member/caregiver   Family Member Consulted dad        Problem List Patient Active Problem List   Diagnosis Date Noted  . 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 Urinary retention 06/23/2015  . Acute respiratory failure ([place]) 06/23/2015  . Traumatic subarachnoid bleed with LOC of 6 hours to 24 hours ([place]) 06/23/2015  . Right spastic hemiparesis ([place]) 06/23/2015  . Fall 06/22/2015  .  Ocular trauma of right eye 06/22/2015  . TBI (traumatic brain injury) ([place]) 06/16/2015   Billie Ruddy, PT, [facility] [address] [place], [place], [address] Phone: [phone]   Fax:  [phone] 10/07/2015, 1:08 PM  Name: Ashlee Bewley MRN: [id number] Date of Birth: Nov 19, 1985

## 2015-10-07 NOTE — Therapy (Signed)
[name] [address] [place], [place], [address] Phone: [phone]   Fax:  [phone]  Occupational Therapy Treatment  Patient Details  Name: Dakota [name] MRN: [id number] Date of Birth: 1985-09-27 No Data Recorded  Encounter Date: 10/07/2015      OT End of Session - 10/07/15 0958    Visit Number 14   Number of Visits 24   Date for OT Re-Evaluation 11/19/15   Authorization Type BC[employer]   Authorization Time Period  week 1/12   Authorization - Visit Number 4   Authorization - Number of Visits 15   OT Start Time 0935   OT Stop Time 1015   OT Time Calculation (min) 40 min   Activity Tolerance Patient tolerated treatment well   Behavior During Therapy [facility] for tasks assessed/performed      Past Medical History  Diagnosis Date  . Hx of renal calculi     No past surgical history on file.  There were no vitals filed for this visit.  Visit Diagnosis:  Right hemiparesis ([place])  Pain  Spasticity  Inattention  Cognitive deficits      Subjective Assessment - 10/07/15 0956    Subjective  Pt reports he has been trying to use right hand at home   Patient is accompained by: Family member   Pertinent History Pt hospitalized on 06/16/15 for TBI with SAH, L eye injury (retrobulbar hemorrhage). Transferred to inpatient rehab on 10/13, where he remained until discharged home on 07/14/15.   Patient Stated Goals use RUE again   Currently in Pain? No/denies        Treatment:seated midrange shoulder flexion with min facilitation for elbow extension and control. Quadraped with RUE cat and cow, rocking  and forwards and back for increased weight bearing through RUE and core stability, min facilitation/ v.c. Dynamic open chain reaching in standing to remove pegs from semicircle with RUE while using estim, with min-mod facilitation. NMES 50 pps, 250 pw, 10 secs cycle, intensity 20 x 10 mins, no adverse reactions. Pt  demonstrates improved finger extension end of tx                         OT Short Term Goals - 09/23/15 0952    OT SHORT TERM GOAL #1   Title Pt will be independent with initial HEP--check STGs 08/16/15   Time --   Period Days   Status Achieved   OT SHORT TERM GOAL #2   Title Pt will demo at least 80* R shoulder flex with minimal compensation in prep for functional reach.   Baseline 70*   Time 6   Status On-going  08/22/15  80-90 with mod compensation   OT SHORT TERM GOAL #3   Title Pt will demo at least 50% gross finger ext for grasp/release of objects.   Baseline 30-50% on 09/23/15   Time 6   Period Weeks   Status On-going  09/22/14  30-50% varies with spasticity   OT SHORT TERM GOAL #4   Title Pt will be able to use RUE as gross assist/stabilizer at least 25% of the time for ADLs.   Baseline uses grossly 50% of the time per pt report-09/23/15   Time --   Period Weeks   Status Achieved  08/22/15:  approx this level per pt report   OT SHORT TERM GOAL #5   Title Pt will grasp/release small cylinder object with min A in 4/5 trials.  Baseline Pt performs 4/5 trials- 09/23/15   Time --   Period Weeks   Status Achieved  08/22/15  met at approx this level   Additional Short Term Goals   Additional Short Term Goals Yes   OT SHORT TERM GOAL #6   Title Pt will be independent with splint wear/care prn.   Baseline wears splint 2-3 hrs at a time, not able to tolerate all night-09/23/15   Time 6   Period Weeks   Status Partially Met  wears 2-3 hrs per day   OT SHORT TERM GOAL #7   Title Pt will increase RUE functional use as evidenced by performing 4 blocks on box/ blocks test.   Baseline 1 block, 3 blocks on 09/23/15   Time 6   Period Weeks   Status New   OT SHORT TERM GOAL #8   Title Pt will be independent with newly updated HEP.    Baseline independent with previsous HEP-09/23/15   Time 6   Period Weeks   Status New   OT SHORT TERM GOAL  #9   TITLE Pt  will use RUE as a gross assist 60% of the time for ADLs/ IADLs.   Baseline uses grossly 50% of the time as gross A/ stabilizer-09/23/15   Time 6   Period Weeks   Status New           OT Long Term Goals - 09/23/15 1258    OT LONG TERM GOAL #1   Title Pt will be independent with updated HEP.--   Baseline met, 09/23/15   Time --   Period Weeks   Status Achieved   OT LONG TERM GOAL #2   Title Pt will demo at least 90* R shoulder flex with minimal compensation in prep for functional reach.   Baseline 80-90* with mod compensations-09/23/15   Time 12   Period Weeks   Status On-going  09/23/15-80-90* with mod compensations   OT LONG TERM GOAL #3   Title Pt will improve RUE functional reaching and coordination as shown by scoring at least 8 on box and blocks test.   Baseline 1,3 blocks on 09/23/15   Time 12   Period Weeks   Status On-going  09/23/15- 1, 3 blocks- avg 2 blocks   OT LONG TERM GOAL #4   Title Pt will use RUE as gross assist/stabilizer at least 50% of the time for ADLs.   Baseline met per pt report uses 50% of the time   Time --   Period Weeks   Status Achieved  met per pt. report   OT LONG TERM GOAL #5   Title Pt will demo good safety awareness during functional tasks and per family report.   Baseline Pt continues to require cueing for safety 09/23/15   Time 12   Period Weeks   Status On-going   Long Term Additional Goals   Additional Long Term Goals Yes   OT LONG TERM GOAL #6   Title Pt will verbalize understanding of cognitive and visual compensation strategies prn.   Baseline Pt continues to require assistance and is not consistently using strategies, Pt requires verbal cues for strategies-09/23/15   Time 12   Period Weeks   Status On-going   OT LONG TERM GOAL #7   Title Pt will use RUE as a gross A  75% of the time for ADLs/ IADLS    Baseline Pt uses grossly 50% of the time-09/23/15   Time 12   Period Weeks  Status New               Plan -  10/07/15 0957    Clinical Impression Statement Pt is progressing towards goals with improving RUE functional use.   Pt will benefit from skilled therapeutic intervention in order to improve on the following deficits (Retired) Decreased cognition;Decreased mobility;Decreased strength;Impaired vision/preception;Impaired UE functional use;Pain;Decreased knowledge of use of DME;Decreased balance;Impaired tone;Decreased safety awareness;Decreased coordination;Decreased range of motion;Impaired sensation   Rehab Potential Good   OT Frequency Other (comment)  20 visits   OT Duration 12 weeks   OT Treatment/Interventions Self-care/ADL training;Therapeutic exercise;Functional Mobility Training;Patient/family education;Balance training;Splinting;Manual Therapy;Neuromuscular education;Ultrasound;Iontophoresis;Energy conservation;Therapeutic exercises;Therapeutic activities;DME and/or AE instruction;Parrafin;Cryotherapy;Electrical Stimulation;Fluidtherapy;Cognitive remediation/compensation;Visual/perceptual remediation/compensation;Passive range of motion;Moist [employer], operational re-ed   Consulted and Agree with Plan of Care Patient;Family member/caregiver   Family Member Consulted father        Problem List Patient Active Problem List   Diagnosis Date Noted  . 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 Urinary retention 06/23/2015  . Acute respiratory failure ([place]) 06/23/2015  . Traumatic subarachnoid bleed with LOC of 6 hours to 24 hours ([place]) 06/23/2015  . Right spastic hemiparesis ([place]) 06/23/2015  . Fall 06/22/2015  . Ocular trauma of right eye 06/22/2015  . TBI (traumatic brain injury) ([place]) 06/16/2015    Mada Sadik 10/07/2015, 12:58 PM Theone Murdoch, OTR/L [phone] Phone: [phone] 12:58 PM 10/07/2015 [facility] [address] [place], [place], [address] Phone: [phone]   Fax:  [phone]  Name:  Dakota [name] MRN: [id number] Date of Birth: 02-26-1986

## 2015-10-10 ENCOUNTER — Encounter
Payer: BLUE CROSS/BLUE SHIELD | Attending: Physical Medicine & Rehabilitation | Admitting: Physical Medicine & Rehabilitation

## 2015-10-10 ENCOUNTER — Encounter: Payer: 59 | Admitting: Occupational Therapy

## 2015-10-10 ENCOUNTER — Encounter: Payer: Self-pay | Admitting: Physical Medicine & Rehabilitation

## 2015-10-10 ENCOUNTER — Ambulatory Visit: Payer: 59 | Admitting: Physical Therapy

## 2015-10-10 VITALS — BP 126/70 | HR 90 | Resp 16

## 2015-10-10 DIAGNOSIS — G8110 Spastic hemiplegia affecting unspecified side: Secondary | ICD-10-CM | POA: Insufficient documentation

## 2015-10-10 DIAGNOSIS — Z87442 Personal history of urinary calculi: Secondary | ICD-10-CM | POA: Diagnosis not present

## 2015-10-10 DIAGNOSIS — F329 Major depressive disorder, single episode, unspecified: Secondary | ICD-10-CM

## 2015-10-10 DIAGNOSIS — G8111 Spastic hemiplegia affecting right dominant side: Secondary | ICD-10-CM

## 2015-10-10 DIAGNOSIS — S066X4S Traumatic subarachnoid hemorrhage with loss of consciousness of 6 hours to 24 hours, sequela: Secondary | ICD-10-CM | POA: Diagnosis not present

## 2015-10-10 DIAGNOSIS — S066X9A Traumatic subarachnoid hemorrhage with loss of consciousness of unspecified duration, initial encounter: Secondary | ICD-10-CM | POA: Diagnosis not present

## 2015-10-10 DIAGNOSIS — Z87891 Personal history of nicotine dependence: Secondary | ICD-10-CM | POA: Diagnosis not present

## 2015-10-10 NOTE — Progress Notes (Signed)
Subjective:    Patient ID: Dakota [name], male    DOB: 04-24-1986, 30 y.o.   MRN: [id number]  HPI   Dakota [name] is here in follow up of his TBI. He has been going to therapy. His balance is better as has his right arm and hand. His left eye is improving as is the vision. He is reportedly 20-40 in the eye.   He is not having any spasms on the right side. He is able to use the arm for opening doors and more gross activities.   Sleep is improving with more activity during the day. He is trying to walk about 3 hours per day.   His mood has improved. He is still taking the celexa which we started last year.    Pain Inventory Average Pain 0 Pain Right Now 0 My pain is Patient has not started this medication yet  In the last 24 hours, has pain interfered with the following? General activity 0 Relation with others 0 Enjoyment of life 0 What TIME of day is your pain at its worst? no pain Sleep (in general) Fair  Pain is worse with: no pain Pain improves with: no pain Relief from Meds: no pain  Mobility walk without assistance how many minutes can you walk? 60 ability to climb steps?  yes do you drive?  no  Function disabled: date disabled .  Neuro/Psych No problems in this area  Prior Studies Any changes since last visit?  no  Physicians involved in your care Any changes since last visit?  no   Family History  Problem Relation Age of Onset  . Healthy Mother   . Healthy Father   . Cancer Maternal Grandmother   . Healthy Maternal Grandfather   . Heart disease Paternal Grandfather    Social History   Social History  . Marital Status: Married    Spouse Name: N/A  . Number of Children: N/A  . Years of Education: N/A   Social History Main Topics  . Smoking status: Former Smoker -- 0.50 packs/day    Types: Cigarettes  . Smokeless tobacco: Former Neurosurgeon    Quit date: 06/16/2015  . Alcohol Use: No  . Drug Use: No  . Sexual Activity: Not Asked   Other Topics Concern   . None   Social History Narrative   History reviewed. No pertinent past surgical history. Past Medical History  Diagnosis Date  . Hx of renal calculi    BP 126/70 mmHg  Pulse 90  Resp 16  SpO2 99%  Opioid Risk Score:   Fall Risk Score:  `1  Depression screen PHQ 2/9  Depression screen [facility] 2/9 08/09/2015 08/09/2015  Decreased Interest 0 0  Down, Depressed, Hopeless 1 0  PHQ - 2 Score 1 0     Review of Systems  All other systems reviewed and are negative.      Objective:   Physical Exam HENT:  Head: Normocephalic. Left eye and face trauma Eyes: Right eye exhibits no discharge.  Left ptosis. .  Neck: Normal range of motion. Neck supple.  Cardiovascular: Normal rate and regular rhythm.  No murmur heard.  Respiratory: Effort normal and breath sounds normal. No respiratory distress. He has no wheezes.  GI: Soft. Bowel sounds are normal. He exhibits no distension. There is no tenderness. No rebound Musculoskeletal: He exhibits tenderness. Minimal edema in LUE.  Neurological: He is alert and oriented. able to open lid to about 90% currently on left.. Left pupil dilated  and now dose constrict slightly to light. Tracks almost completely in medial/lateral planes. Does lack some upward movement on left eye.  Right facial weakness with mild to moderate dysarthria and right tongue deviation. RUE: 2/5 shoulder, 2- biceps, triceps, 2+ finger and wrist flexion, 1- extension/1-2 HI. RLE: grossly 4+/5 HF,4/5 KE, 3/5 ADF/APF. Extensor tone seems minimal today RLE Tight heel cord still  5/5 on the left upper and left lower extremity. Gait mechanics are fairly good. He utilizes excessive hip swing in advancing the right leg but overall it's mild.  Sensation intact to light touch.  Psychiatric: His affect is more dynamic. Improved insight and awareness.   Assessment & Plan:   1. Functional deficits secondary to TBI with SAH --resultant spastic right hemiparesis  -continues to make  progress  -outpatient therapies 2. ?Reactive depression/imsomnia: will initiate low dose celexa  qhs  3. Left retrobulbar hemorrhage with RAPD: had had substantial natural recovery  -per optho.  4. Spasticity: tone in RLE improved with AFO and increase mobility, stretching with therapies and on his own-  -continue baclofen for extensor tone in RLE-  -splinting/ROM  - botox to right wrist/finger flexors and biceps---200 units.  -might be good idea to hold OT until botox injections are performed   Thirty minutes of face to face patient care time were spent during this visit. All questions were encouraged and answered. Follow up in two months.

## 2015-10-10 NOTE — Patient Instructions (Signed)
  PLEASE CALL ME WITH ANY PROBLEMS OR QUESTIONS (#[phone]).      

## 2015-10-13 ENCOUNTER — Encounter: Payer: Self-pay | Admitting: Physical Therapy

## 2015-10-13 ENCOUNTER — Ambulatory Visit: Payer: BLUE CROSS/BLUE SHIELD | Attending: Physical Medicine & Rehabilitation | Admitting: Physical Therapy

## 2015-10-13 ENCOUNTER — Ambulatory Visit: Payer: BLUE CROSS/BLUE SHIELD | Admitting: Occupational Therapy

## 2015-10-13 DIAGNOSIS — R2681 Unsteadiness on feet: Secondary | ICD-10-CM | POA: Diagnosis present

## 2015-10-13 DIAGNOSIS — G8191 Hemiplegia, unspecified affecting right dominant side: Secondary | ICD-10-CM | POA: Diagnosis present

## 2015-10-13 DIAGNOSIS — R269 Unspecified abnormalities of gait and mobility: Secondary | ICD-10-CM | POA: Insufficient documentation

## 2015-10-13 DIAGNOSIS — R41840 Attention and concentration deficit: Secondary | ICD-10-CM

## 2015-10-13 DIAGNOSIS — R279 Unspecified lack of coordination: Secondary | ICD-10-CM

## 2015-10-13 DIAGNOSIS — R258 Other abnormal involuntary movements: Secondary | ICD-10-CM | POA: Insufficient documentation

## 2015-10-13 DIAGNOSIS — R252 Cramp and spasm: Secondary | ICD-10-CM

## 2015-10-13 NOTE — Therapy (Signed)
[facility] [address] [place], [place], [address] Phone: [phone]   Fax:  [phone]  Occupational Therapy Treatment  Patient Details  Name: Dakota [name] MRN: [id number] Date of Birth: 21-May-1986 No Data Recorded  Encounter Date: 10/13/2015      OT End of Session - 10/13/15 1614    Visit Number 15   Number of Visits 24   Date for OT Re-Evaluation 11/19/15   Authorization Type BCBS (30 visit limit combined OT/PT), Medicaid secondary (20 visits 1/20-4/13/17)   Authorization Time Period  week 3/12, Medicaid visit 3/20   Authorization - Visit Number 7  PT used 7 visits this year Nurse, mental health)   Authorization - Number of Visits 23  BCBS 15+8 from PT=23   OT Start Time 1533   OT Stop Time 1617   OT Time Calculation (min) 44 min   Activity Tolerance Patient tolerated treatment well   Behavior During Therapy [facility] for tasks assessed/performed      Past Medical History  Diagnosis Date  . Hx of renal calculi     No past surgical history on file.  There were no vitals filed for this visit.  Visit Diagnosis:  Right hemiparesis ([place])  Lack of coordination  Spasticity  Inattention      Subjective Assessment - 10/13/15 2045    Subjective  Pt/wife reports using RUE more at home.  Wife reportsthat wife is attending to RUE more.  Pt/wife report that he will receive Botox 10/31/15   Patient is accompained by: Family member  wife   Pertinent History Pt hospitalized on 06/16/15 for TBI with SAH, L eye injury (retrobulbar hemorrhage). Transferred to inpatient rehab on 10/13, where he remained until discharged home on 07/14/15.   Patient Stated Goals use RUE again   Currently in Pain? No/denies                      OT Treatments/Exercises (OP) - 10/13/15 0001    Neurological Re-education Exercises   Shoulder Flexion AAROM;Right;Seated  min facilitation and min-mod cues for normal movements    Other  Exercises 1 Focus on normal movement patterns for functional movements throughout session/transitional movements with min cueing/facilitation.   Other Exercises 2 Elbow ext with UE ranger with min cueing for control (smooth movements) and then in mid-high level along diagonal with UE ranger (AAROM) with min facilitation and cueing.   Other Grasp and Release Exercises  Low range functional reaching to grasp 1-inch blocks, small-medium cylinder objects with intermittent facilitation/stretch to open fingers prior to grasp.  Pt needed min facilitation for finger ext for grasp and occasional min cueing for compensation with reach, but consistently able to relax/and partially extend fingers actively for release.  Reaching in forward flex and laterally.   Seated with weight on hand with body on arm movements with min cues   Other Weight-Bearing Exercises 1 Wt. bearing in quadraped with cat/cow positions for incr scapular stability/movement and decr spasticity with min cueing.  Modified quadraped for forward/backward wt.shifts with min cueing.     Arm bike x19mn level 1 for reciprocal movements with min cueing initially for slow, controlled movements.  Pt lost grasp x1.             OT Education - 10/13/15 2046    Education Details Purpose of Botox and when it takes affect;  Not to use too much power when trying to extend elbow (wife reports "popping" at times) but to use  slow, controlled movements instead without compensation   Person(s) Educated Patient   Methods Explanation   Comprehension Verbalized understanding          OT Short Term Goals - 09/23/15 0952    OT SHORT TERM GOAL #1   Title Pt will be independent with initial HEP--check STGs 08/16/15   Time --   Period Days   Status Achieved   OT SHORT TERM GOAL #2   Title Pt will demo at least 80* R shoulder flex with minimal compensation in prep for functional reach.   Baseline 70*   Time 6   Status On-going  08/22/15  80-90 with mod  compensation   OT SHORT TERM GOAL #3   Title Pt will demo at least 50% gross finger ext for grasp/release of objects.   Baseline 30-50% on 09/23/15   Time 6   Period Weeks   Status On-going  09/22/14  30-50% varies with spasticity   OT SHORT TERM GOAL #4   Title Pt will be able to use RUE as gross assist/stabilizer at least 25% of the time for ADLs.   Baseline uses grossly 50% of the time per pt report-09/23/15   Time --   Period Weeks   Status Achieved  08/22/15:  approx this level per pt report   OT SHORT TERM GOAL #5   Title Pt will grasp/release small cylinder object with min A in 4/5 trials.   Baseline Pt performs 4/5 trials- 09/23/15   Time --   Period Weeks   Status Achieved  08/22/15  met at approx this level   Additional Short Term Goals   Additional Short Term Goals Yes   OT SHORT TERM GOAL #6   Title Pt will be independent with splint wear/care prn.   Baseline wears splint 2-3 hrs at a time, not able to tolerate all night-09/23/15   Time 6   Period Weeks   Status Partially Met  wears 2-3 hrs per day   OT SHORT TERM GOAL #7   Title Pt will increase RUE functional use as evidenced by performing 4 blocks on box/ blocks test.   Baseline 1 block, 3 blocks on 09/23/15   Time 6   Period Weeks   Status New   OT SHORT TERM GOAL #8   Title Pt will be independent with newly updated HEP.    Baseline independent with previsous HEP-09/23/15   Time 6   Period Weeks   Status New   OT SHORT TERM GOAL  #9   TITLE Pt will use RUE as a gross assist 60% of the time for ADLs/ IADLs.   Baseline uses grossly 50% of the time as gross A/ stabilizer-09/23/15   Time 6   Period Weeks   Status New           OT Long Term Goals - 09/23/15 1258    OT LONG TERM GOAL #1   Title Pt will be independent with updated HEP.--   Baseline met, 09/23/15   Time --   Period Weeks   Status Achieved   OT LONG TERM GOAL #2   Title Pt will demo at least 90* R shoulder flex with minimal compensation in  prep for functional reach.   Baseline 80-90* with mod compensations-09/23/15   Time 12   Period Weeks   Status On-going  09/23/15-80-90* with mod compensations   OT LONG TERM GOAL #3   Title Pt will improve RUE functional reaching and coordination as shown  by scoring at least 8 on box and blocks test.   Baseline 1,3 blocks on 09/23/15   Time 12   Period Weeks   Status On-going  09/23/15- 1, 3 blocks- avg 2 blocks   OT LONG TERM GOAL #4   Title Pt will use RUE as gross assist/stabilizer at least 50% of the time for ADLs.   Baseline met per pt report uses 50% of the time   Time --   Period Weeks   Status Achieved  met per pt. report   OT LONG TERM GOAL #5   Title Pt will demo good safety awareness during functional tasks and per family report.   Baseline Pt continues to require cueing for safety 09/23/15   Time 12   Period Weeks   Status On-going   Long Term Additional Goals   Additional Long Term Goals Yes   OT LONG TERM GOAL #6   Title Pt will verbalize understanding of cognitive and visual compensation strategies prn.   Baseline Pt continues to require assistance and is not consistently using strategies, Pt requires verbal cues for strategies-09/23/15   Time 12   Period Weeks   Status On-going   OT LONG TERM GOAL #7   Title Pt will use RUE as a gross A  75% of the time for ADLs/ IADLS    Baseline Pt uses grossly 50% of the time-09/23/15   Time 12   Period Weeks   Status New               Plan - 10/13/15 2055    Clinical Impression Statement Pt is progressing towards goals with improving RUE functional use with improved control and less compensation.   Pt will benefit from skilled therapeutic intervention in order to improve on the following deficits (Retired) Decreased cognition;Decreased mobility;Decreased strength;Impaired vision/preception;Impaired UE functional use;Pain;Decreased knowledge of use of DME;Decreased balance;Impaired tone;Decreased safety  awareness;Decreased coordination;Decreased range of motion;Impaired sensation   Rehab Potential Good   OT Frequency --  20 visits   OT Duration 12 weeks   OT Treatment/Interventions Self-care/ADL training;Therapeutic exercise;Functional Mobility Training;Patient/family education;Balance training;Splinting;Manual Therapy;Neuromuscular education;Ultrasound;Iontophoresis;Energy conservation;Therapeutic exercises;Therapeutic activities;DME and/or AE instruction;Parrafin;Cryotherapy;Electrical Stimulation;Fluidtherapy;Cognitive remediation/compensation;Visual/perceptual remediation/compensation;Passive range of motion;Moist [employer] neuro re-ed, may place on hold after next session until after Botox due to limited visits   Consulted and Agree with Plan of Care Patient;Family member/caregiver   Family Member Consulted wife        Problem List Patient Active Problem List   Diagnosis Date Noted  . 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 Urinary retention 06/23/2015  . Acute respiratory failure ([place]) 06/23/2015  . Traumatic subarachnoid bleed with LOC of 6 hours to 24 hours ([place]) 06/23/2015  . Right spastic hemiparesis ([place]) 06/23/2015  . Fall 06/22/2015  . Ocular trauma of right eye 06/22/2015  . TBI (traumatic brain injury) [facility]) 06/16/2015    [facility] 10/13/2015, 9:13 PM  [facility] [address] [place], [place], [address] Phone: [phone]   Fax:  [phone]  Name: Unique Searfoss MRN: [id number] Date of Birth: [birth year]  Vianne Bulls, OTR/L 10/13/2015 9:13 PM

## 2015-10-13 NOTE — Therapy (Signed)
[facility] [address] [place], [place], [address] Phone: [phone]   Fax:  [phone]  Physical Therapy Treatment  Patient Details  Name: Dakota [name] MRN: [id number] Date of Birth: August 28, 1986 Referring Provider: Dwana Melena, MD  Encounter Date: 10/13/2015      PT End of Session - 10/13/15 1452    Visit Number 17   Number of Visits 17   Date for PT Re-Evaluation 10/23/15  Per CCME approval   Authorization Type BCBS primary; Medicaid secondary   Authorization Time Period CCME approved 4 visits from 1/19 - 3/15.   Authorization - Visit Number 3   Authorization - Number of Visits 4   PT Start Time 6384   PT Stop Time 1530   PT Time Calculation (min) 42 min   Equipment Utilized During Treatment Gait belt   Activity Tolerance Patient tolerated treatment well   Behavior During Therapy WFL for tasks assessed/performed      Past Medical History  Diagnosis Date  . Hx of renal calculi     History reviewed. No pertinent past surgical history.  There were no vitals filed for this visit.  Visit Diagnosis:  Abnormality of gait  Lack of coordination  Unsteadiness  Right hemiparesis [facility])      Subjective Assessment - 10/13/15 1451    Subjective Was trying to skip at home and right big toe got caught. Was painful, not bruised. Less painful today. No falls.    Patient is accompained by: Family member  spouse   Pertinent History SAH (06/16/15); L eye contusion, retrobulbar hemorrhage s/p canthotomy   Patient Stated Goals "Just to walk better; walk without the cane or the brace and to use the arm again."   Currently in Pain? No/denies   Pain Score 0-No pain           [facility] Adult PT Treatment/Exercise - 10/13/15 1525    Ambulation/Gait   Ambulation/Gait Yes   Ambulation/Gait Assistance 6: Modified independent (Device/Increase time)   Ambulation Distance (Feet) 1000 Feet   Assistive device None    Gait Pattern Step-through pattern;Decreased arm swing - right   Ambulation Surface Unlevel;Level;Indoor;Outdoor;Paved     Exercises: Pt able to perform all exercises issued to date with handout for reference. Spouse present and educated as well on which ones to do.           PT Short Term Goals - 10/13/15 1515    PT SHORT TERM GOAL #1   Title Pt will perform initial HEP with mod I using paper handout to maximize functional gains made in PT. Target date: 08/15/15   Baseline Met 11/23. On 10/13/15: pt able to demo advance HEP with handout/visual reference.    Status Achieved   PT SHORT TERM GOAL #2   Title Pt will negotiate 9 stairs with single rail and mod I (no cueing for safety awareness) to indicate safety using primary home entrance. Target date: 08/15/15   Baseline Achieved: 08/17/15. Pt negotiated 12 stairs mod I.   Status Achieved   PT SHORT TERM GOAL #3   Title Pt will increase gait velocity from 1.97 ft/sec to 2.27 ft/sec to indicate increase efficiency of ambulation. Target date: 08/15/15   Baseline Achieved: 08/17/15. Pt achieve 2.64f/sec gait speed.   Status Achieved   PT SHORT TERM GOAL #4   Title Pt will independently ambulate 200' without AD using AFO to progress toward PLOF and pt-stated goal of independent ambulation. Target date: 08/15/15  Baseline Achieved: 08/17/15. Pt I with ambulating 254f without AD with AFO on indoor, level surfaces.   Status Achieved   PT SHORT TERM GOAL #5   Title Pt will ambulate 500' over unlevel, paved surfaces with mod I using LRAD to indicate increased safety with limited community mobility. Target date: 08/15/15   Baseline Met 1/13.   Status Achieved   PT SHORT TERM GOAL #6   Title Complete DGI to assess dynamic gait stability. Target date: 08/15/15   Baseline 11/18: DGI score = 13/24   Status Achieved   PT SHORT TERM GOAL #7   Title Pt will improve 6MWT distance from 1,022' to 1,079' to indicate improved functional endurance.Target date:  09/12/15   Status Achieved   PT SHORT TERM GOAL #8   Title Pt will independently ambulate 1,000' over unlevel, paved surfaces with no device/AFO and no evidence of Trendelenburg gait pattern to decrease R hip pain with walking prolonged distances. Target date: 10/14/15.   Baseline met on 10/13/15   Status Achieved           PT Long Term Goals - 10/13/15 1514    PT LONG TERM GOAL #1   Title Pt will increase gait velocity from 1.97 ft/sec to > 2.62 ft/sec to indicate status of community ambulator. Target date: 09/12/15   Baseline 09/14/15: 3.5 ft/sec without brace/AD   Status Achieved   PT LONG TERM GOAL #2   Title Pt will score >/= 20/24 on DGI to indicate decreased risk of falling. Target date: 09/12/15   Baseline 1/13: DGI score = 23/24.   Status Achieved   PT LONG TERM GOAL #3   Title Pt will independently negotiate 9 stairs with single rail and reciprocal pattern to indicate pt independence using primary home entrance. Target date: 09/12/15   Baseline met on 09/14/15 with mild recurvatum noted without brace   Status Achieved   PT LONG TERM GOAL #4   Title Pt will independently ambulate 200' over level, indoor surfaces without assistive device or AFO to progress toward PLOF as independent with household ambulation. Target date: 09/12/15   Baseline met on 09/14/15   Status Achieved   PT LONG TERM GOAL #5   Title Pt will ambulate >1,000' over outdoor, paved surfaces with mod I using LRAD to indicate increased independence with commnuity mobility. Target date: 09/12/15   Baseline 09/14/15: without brace/AD   Status Achieved   PT LONG TERM GOAL #6   Title Pt will negotiate standard ramp and curb step with mod I using LRAD to indicate safety traversing community obstacles. Target date: 09/12/15   Baseline Met 1/13.   Status Achieved            Plan - 10/13/15 1452    Clinical Impression Statement Pt met remaining goals today. Remainder of session was focuesed on HEP performance. Pt able to perform  all exerices issued with visual reference. Pt's spouse was present today and educated on the new ones that have been added over the last few sessions. Both agreeable to pt being discharged today.   Pt will benefit from skilled therapeutic intervention in order to improve on the following deficits Abnormal gait;Decreased cognition;Decreased balance;Decreased strength;Decreased safety awareness;Impaired vision/preception;Impaired UE functional use;Impaired tone   Rehab Potential Good   Clinical Impairments Affecting Rehab Potential decreased safety awareness; impaired vision in L eye   PT Frequency 2x / week   PT Duration 8 weeks   PT Treatment/Interventions ADLs/Self Care Home Management;Electrical Stimulation;DME Instruction;Gait training;Stair  training;Functional mobility training;Therapeutic activities;Therapeutic exercise;Balance training;Neuromuscular re-education;Vestibular;Visual/perceptual remediation/compensation;Patient/family education   PT Next Visit Plan dischareg today per PT plan of care.   Consulted and Agree with Plan of Care Patient;Family member/caregiver   Family Member Consulted dad        Problem List Patient Active Problem List   Diagnosis Date Noted  . 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 Urinary retention 06/23/2015  . Acute respiratory failure ([place]) 06/23/2015  . Traumatic subarachnoid bleed with LOC of 6 hours to 24 hours ([place]) 06/23/2015  . Right spastic hemiparesis ([place]) 06/23/2015  . Fall 06/22/2015  . Ocular trauma of right eye 06/22/2015  . TBI (traumatic brain injury) [facility]) 06/16/2015    Willow Ora 10/14/2015, 9:29 AM  Willow Ora, PTA, [place] [address], [place], [place] [address] [phone] 10/14/2015, 9:30 AM   Name: Jaquail Mclees MRN: [id number] Date of Birth: 1985-10-26

## 2015-10-14 ENCOUNTER — Encounter: Payer: Self-pay | Admitting: Physical Therapy

## 2015-10-14 NOTE — Therapy (Signed)
[place] [address] [place], [place], [address] Phone: [phone]   Fax:  [phone]  Patient Details  Name: Renard Caperton MRN: [id number] Date of Birth: 1986/04/15 Referring Provider:  Alger Simons, MD  Encounter Date: 10/14/2015  PHYSICAL THERAPY DISCHARGE SUMMARY  Visits from Start of Care: 17  Current functional level related to goals / functional outcomes:     PT Long Term Goals - 10/13/15 1514    PT LONG TERM GOAL #1   Title Pt will increase gait velocity from 1.97 ft/sec to > 2.62 ft/sec to indicate status of community ambulator. Target date: 09/12/15   Baseline 09/14/15: 3.5 ft/sec without brace/AD   Status Achieved   PT LONG TERM GOAL #2   Title Pt will score >/= 20/24 on DGI to indicate decreased risk of falling. Target date: 09/12/15   Baseline 1/13: DGI score = 23/24.   Status Achieved   PT LONG TERM GOAL #3   Title Pt will independently negotiate 9 stairs with single rail and reciprocal pattern to indicate pt independence using primary home entrance. Target date: 09/12/15   Baseline met on 09/14/15 with mild recurvatum noted without brace   Status Achieved   PT LONG TERM GOAL #4   Title Pt will independently ambulate 200' over level, indoor surfaces without assistive device or AFO to progress toward PLOF as independent with household ambulation. Target date: 09/12/15   Baseline met on 09/14/15   Status Achieved   PT LONG TERM GOAL #5   Title Pt will ambulate >1,000' over outdoor, paved surfaces with mod I using LRAD to indicate increased independence with commnuity mobility. Target date: 09/12/15   Baseline 09/14/15: without brace/AD   Status Achieved   PT LONG TERM GOAL #6   Title Pt will negotiate standard ramp and curb step with mod I using LRAD to indicate safety traversing community obstacles. Target date: 09/12/15   Baseline Met 1/13.   Status Achieved       Remaining deficits: At times, pt continues to  require cueing for safety awareness and control of R knee during mobility over unlevel surfaces.   Education / Equipment: HEP and progression.   Plan: Patient agrees to discharge.  Patient goals were met. Patient is being discharged due to meeting the stated rehab goals.  ?????       Billie Ruddy, PT, DPT [facility] [address] [place], [place], [address] Phone: [phone]   Fax:  [phone] 10/14/2015, 10:05 AM

## 2015-10-17 ENCOUNTER — Encounter: Payer: 59 | Admitting: Occupational Therapy

## 2015-10-20 ENCOUNTER — Ambulatory Visit: Payer: BLUE CROSS/BLUE SHIELD | Admitting: Occupational Therapy

## 2015-10-20 DIAGNOSIS — G8191 Hemiplegia, unspecified affecting right dominant side: Secondary | ICD-10-CM

## 2015-10-20 DIAGNOSIS — R41840 Attention and concentration deficit: Secondary | ICD-10-CM

## 2015-10-20 DIAGNOSIS — R279 Unspecified lack of coordination: Secondary | ICD-10-CM

## 2015-10-20 DIAGNOSIS — R269 Unspecified abnormalities of gait and mobility: Secondary | ICD-10-CM | POA: Diagnosis not present

## 2015-10-20 DIAGNOSIS — R252 Cramp and spasm: Secondary | ICD-10-CM

## 2015-10-20 NOTE — Patient Instructions (Addendum)
Upper Extremity Extension (All-Fours)   Scapular Retraction (Prone)   Lie with arms at sides. Pinch shoulder blades together and raise arms a few inches from floor. Repeat 15 times per set. Do 1 sessions per day.  http://orth.exer.us/954      2.  Lay down, raise arm above head keeping elbow straight and thumb facing up x10-15    3.  Weight bear on hands and knees.  Arc back up keeping elbows straight and look down.  Hold 5sec, then look up and bring shoulder blades together hold 5sec.  Repeat 10x, 1x day.  4.  In standing, wipe table keeping elbow straight.  5.  Practicing picking up small bottles/blocks.  May need to stretch hand frequently or weight bear.

## 2015-10-20 NOTE — Therapy (Signed)
[facility] [address] [place], [place], [address] Phone: [phone]   Fax:  [phone]  Occupational Therapy Treatment  Patient Details  Name: Dakota [name] MRN: [id number] Date of Birth: 02/25/1986 No Data Recorded  Encounter Date: 10/20/2015      OT End of Session - 10/20/15 1528    Visit Number 16   Number of Visits 24   Date for OT Re-Evaluation 11/19/15   Authorization Type BCBS (30 visit limit combined OT/PT), Medicaid secondary (20 visits 1/20-4/13/17)   Authorization Time Period  week 4/12, Medicaid visit 4/20   Authorization - Visit Number 8  PT used 7 visits this year [facility])   Authorization - Number of Visits 23  BCBS 15+8 from PT=23   OT Start Time 1532   OT Stop Time 1625   OT Time Calculation (min) 53 min   Activity Tolerance Patient tolerated treatment well   Behavior During Therapy [facility] for tasks assessed/performed      Past Medical History  Diagnosis Date  . Hx of renal calculi     No past surgical history on file.  There were no vitals filed for this visit.  Visit Diagnosis:  Right hemiparesis (HCC)  Lack of coordination  Spasticity  Inattention      Subjective Assessment - 10/20/15 1534    Subjective  Wife reports that pt hikes shoulder a lot a home and that pt is not doing exercises as much as he should.  Pt reports that he is doing more shoulder exercises than hand things.   Patient is accompained by: Family member   Pertinent History Pt hospitalized on 06/16/15 for TBI with SAH, L eye injury (retrobulbar hemorrhage). Transferred to inpatient rehab on 10/13, where he remained until discharged home on 07/14/15.   Patient Stated Goals use RUE again   Currently in Pain? No/denies             OT Treatments/Exercises (OP) - 10/13/15 0001    Neurological Re-education Exercises   Shoulder Flexion AROM;Right;Seated  min facilitation and min cues for normal movements     Other Exercises 1 Focus on normal movement patterns for functional movements throughout session/transitional movements with min cueing/facilitation.   Other Exercises 2 Elbow ext/shoulder flex with UE ranger with min cueing for control (smooth movements) and in mid-high level along diagonal with UE ranger (AAROM) with min facilitation and cueing.   Other Grasp and Release Exercises  Low range functional reaching to grasp small-medium cylinder objects with intermittent min-mod  facilitation/stretch to open fingers prior to grasp and occasional min cueing for compensation with reach.  Reaching in forward flex and laterally.       Other Weight-Bearing Exercises 1 Wt. bearing in quadraped with cat/cow positions for incr scapular stability/movement and decr spasticity with min cueing.  Wt. Bearing on elbows in modified plank position for incr scapular stability.    NMES x57min to R wrist/finger extensors, 50pps, 250 pulse width, intensity=21 with no adverse reactions for neuro re-ed and decr tone (10sec cycle, 2sec ramp)                      OT Education - 10/20/15 1642    Education Details Updated HEP--see pt instructions; advised pt against driving (particularly stick shift); discussed visit limits with BCBS and Medicaid (recommended pt/wife call insurance to ask about co-pay without Medicaid); emphasized importance of doing all exercises regularly and of stretching hand frequently during functional activity and focus  on quality of movement   Person(s) Educated Patient;Spouse   Methods Explanation;Demonstration;Verbal cues;Handout;Tactile cues   Comprehension Verbalized understanding;Returned demonstration;Verbal cues required;Tactile cues required          OT Short Term Goals - 10/20/15 1634    OT SHORT TERM GOAL #2   Title Pt will demo at least 80* R shoulder flex with minimal compensation in prep for functional reach.   Baseline 70*   Time 6   Status On-going   08/22/15  80-90 with mod compensation   OT SHORT TERM GOAL #3   Title Pt will demo at least 50% gross finger ext for grasp/release of objects.   Baseline 30-50% on 09/23/15   Time 6   Period Weeks   Status On-going  09/22/14  30-50% varies with spasticity; 10/20/15 50% inconsistently due to spasticity   OT SHORT TERM GOAL #7   Title Pt will increase RUE functional use as evidenced by performing 4 blocks on box/ blocks test.   Baseline 1 block, 3 blocks on 09/23/15   Time 6   Period Weeks   Status New   OT SHORT TERM GOAL #8   Title Pt will be independent with newly updated HEP.    Baseline independent with previsous HEP-09/23/15   Time 6   Period Weeks   Status On-going  10/20/15  updated HEP   OT SHORT TERM GOAL  #9   TITLE Pt will use RUE as a gross assist 60% of the time for ADLs/ IADLs.   Baseline uses grossly 50% of the time as gross A/ stabilizer-09/23/15   Time 6   Period Weeks   Status On-going           OT Long Term Goals - 10/20/15 1635    OT LONG TERM GOAL #2   Title Pt will demo at least 90* R shoulder flex with minimal compensation in prep for functional reach.   Baseline 80-90* with mod compensations-09/23/15   Time 12   Period Weeks   Status On-going  09/23/15-80-90* with mod compensations   OT LONG TERM GOAL #3   Title Pt will improve RUE functional reaching and coordination as shown by scoring at least 8 on box and blocks test.   Baseline 1,3 blocks on 09/23/15   Time 12   Period Weeks   Status On-going  09/23/15- 1, 3 blocks- avg 2 blocks   OT LONG TERM GOAL #5   Title Pt will demo good safety awareness during functional tasks and per family report.   Baseline Pt continues to require cueing for safety 09/23/15   Time 12   Period Weeks   Status On-going   OT LONG TERM GOAL #6   Title Pt will verbalize understanding of cognitive and visual compensation strategies prn.   Baseline Pt continues to require assistance and is not consistently using strategies, Pt  requires verbal cues for strategies-09/23/15   Time 12   Period Weeks   Status On-going   OT LONG TERM GOAL #7   Title Pt will use RUE as a gross A  75% of the time for ADLs/ IADLS    Baseline Pt uses grossly 50% of the time-09/23/15   Time 12   Period Weeks   Status New               Plan - 10/20/15 1638    Clinical Impression Statement Pt is progressing towards goals with improving RUE use and improved control.  However, pt continues to  need cueing initially for compensation and demo improvement with repetition.   Plan anticipate holding OT until after Botox due to limited visits; check STGs next session   Consulted and Agree with Plan of Care Patient;Family member/caregiver   Family Member Consulted wife        Problem List Patient Active Problem List   Diagnosis Date Noted  . 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 Urinary retention 06/23/2015  . Acute respiratory failure (HCC) 06/23/2015  . Traumatic subarachnoid bleed with LOC of 6 hours to 24 hours (HCC) 06/23/2015  . Right spastic hemiparesis (HCC) 06/23/2015  . Fall 06/22/2015  . Ocular trauma of right eye 06/22/2015  . TBI (traumatic brain injury) (HCC) 06/16/2015    [facility] 10/20/2015, 5:04 PM  [facility] [address] [place], [place], [address] Phone: [phone]   Fax:  [phone]  Name: Dakota [name] MRN: [id number] Date of Birth: 10-07-1985  Willa Frater, OTR/L 10/20/2015 5:04 PM

## 2015-10-25 ENCOUNTER — Encounter: Payer: 59 | Admitting: Occupational Therapy

## 2015-10-28 ENCOUNTER — Encounter: Payer: 59 | Admitting: Occupational Therapy

## 2015-10-31 ENCOUNTER — Encounter
Payer: BLUE CROSS/BLUE SHIELD | Attending: Physical Medicine & Rehabilitation | Admitting: Physical Medicine & Rehabilitation

## 2015-10-31 ENCOUNTER — Encounter: Payer: Self-pay | Admitting: Physical Medicine & Rehabilitation

## 2015-10-31 VITALS — BP 117/67 | HR 93 | Resp 14

## 2015-10-31 DIAGNOSIS — Z87442 Personal history of urinary calculi: Secondary | ICD-10-CM | POA: Diagnosis not present

## 2015-10-31 DIAGNOSIS — S066X9A Traumatic subarachnoid hemorrhage with loss of consciousness of unspecified duration, initial encounter: Secondary | ICD-10-CM | POA: Insufficient documentation

## 2015-10-31 DIAGNOSIS — F329 Major depressive disorder, single episode, unspecified: Secondary | ICD-10-CM | POA: Insufficient documentation

## 2015-10-31 DIAGNOSIS — Z87891 Personal history of nicotine dependence: Secondary | ICD-10-CM | POA: Diagnosis not present

## 2015-10-31 DIAGNOSIS — G8110 Spastic hemiplegia affecting unspecified side: Secondary | ICD-10-CM | POA: Diagnosis not present

## 2015-10-31 DIAGNOSIS — G8111 Spastic hemiplegia affecting right dominant side: Secondary | ICD-10-CM

## 2015-10-31 NOTE — Progress Notes (Signed)
Botox Injection for spasticity using needle EMG guidance Indication: G81.11  Dilution: 100 Units/ml        Total Units Injected: 200 Indication: Severe spasticity which interferes with ADL,mobility and/or  hygiene and is unresponsive to medication management and other conservative care Informed consent was obtained after describing risks and benefits of the procedure with the patient. This includes bleeding, bruising, infection, excessive weakness, or medication side effects. A REMS form is on file and signed.  Needle: 50mm injectable monopolar needle electrode  Number of units per muscle Pectoralis Major 0 units Pectoralis Minor 0 units Biceps 0 units Brachioradialis 0 units FCR 25 units FCU 25 units FDS 50 units FDP 50 units FPL 0 units Pronator Teres 50 units Pronator Quadratus 0 units Quadriceps 0 units Gastroc/soleus 0 units Hamstrings 0 units Tibialis Posterior 0 units EHL 0 units All injections were done after obtaining appropriate EMG activity and after negative drawback for blood. The patient tolerated the procedure well. Post procedure instructions were given. A followup appointment was made.

## 2015-10-31 NOTE — Patient Instructions (Signed)
  PLEASE CALL ME WITH ANY PROBLEMS OR QUESTIONS (#[phone]).      

## 2015-11-01 ENCOUNTER — Encounter: Payer: 59 | Admitting: Occupational Therapy

## 2015-11-04 ENCOUNTER — Encounter: Payer: 59 | Admitting: Occupational Therapy

## 2015-11-07 ENCOUNTER — Encounter: Payer: 59 | Admitting: Occupational Therapy

## 2015-11-11 ENCOUNTER — Encounter: Payer: 59 | Admitting: Occupational Therapy

## 2015-11-17 ENCOUNTER — Ambulatory Visit: Payer: BLUE CROSS/BLUE SHIELD | Attending: Physical Medicine & Rehabilitation | Admitting: Occupational Therapy

## 2015-11-17 DIAGNOSIS — G8191 Hemiplegia, unspecified affecting right dominant side: Secondary | ICD-10-CM | POA: Diagnosis not present

## 2015-11-17 DIAGNOSIS — R258 Other abnormal involuntary movements: Secondary | ICD-10-CM | POA: Diagnosis present

## 2015-11-17 DIAGNOSIS — R279 Unspecified lack of coordination: Secondary | ICD-10-CM | POA: Insufficient documentation

## 2015-11-17 DIAGNOSIS — R41840 Attention and concentration deficit: Secondary | ICD-10-CM | POA: Insufficient documentation

## 2015-11-17 DIAGNOSIS — R252 Cramp and spasm: Secondary | ICD-10-CM

## 2015-11-17 NOTE — Therapy (Signed)
[facility] [address] [place], [place], [address] Phone: [phone]   Fax:  [phone]  Occupational Therapy Treatment  Patient Details  Name: Dakota [name] MRN: [id number] Date of Birth: 1985-10-02 No Data Recorded  Encounter Date: 11/17/2015      OT End of Session - 11/17/15 1526    Visit Number 17   Number of Visits 24   Date for OT Re-Evaluation 11/19/15   Authorization Type BCBS (30 visit limit combined OT/PT), Medicaid secondary (20 visits 1/20-4/13/17)   Authorization Time Period  week 5/12, Medicaid visit 5/20   Authorization - Visit Number 9  PT used 7 visits this year Nurse, mental health)   Authorization - Number of Visits 23  BCBS 15+8 from PT=23   OT Start Time 1409   OT Stop Time 1505   OT Time Calculation (min) 56 min   Activity Tolerance Patient tolerated treatment well   Behavior During Therapy WFL for tasks assessed/performed      Past Medical History  Diagnosis Date  . Hx of renal calculi     No past surgical history on file.  There were no vitals filed for this visit.  Visit Diagnosis:  Right hemiparesis ([place])  Spasticity  Lack of coordination  Inattention      Subjective Assessment - 11/17/15 1517    Subjective  Pt reports that he feels like he is making slow gains, but that he is not sure if Botox has made a difference  "it's hard to tell"   Patient is accompained by: Family member   Pertinent History Pt hospitalized on 06/16/15 for TBI with SAH, L eye injury (retrobulbar hemorrhage). Transferred to inpatient rehab on 10/13, where he remained until discharged home on 07/14/15.   Patient Stated Goals use RUE again   Currently in Pain? No/denies           OT Treatments/Exercises (OP) - 10/13/15 0001    Neurological Re-education Exercises   Shoulder Flexion AROM;Seated  min facilitation and min cues for normal movements with PVC frame    Other Exercises 1 Focus on  normal movement patterns for functional movements throughout session/transitional movements with min cueing/facilitation.   Other Exercises 2 Elbow ext/shoulder flex with UE ranger with min cueing for control (smooth movements) and in mid-high level along diagonal with UE ranger (AAROM) with min facilitation and cueing.   Other Grasp and Release Exercises  Low range functional reaching to grasp small-medium cylinder objects along tabletop with min cueing for light grasp/grading of movement and with elbow ext along tabletop for reach (laterally and across body).  No stretching needed between repetitions today (but NMES prior)       Other Weight-Bearing Exercises 1 Wt. bearing in quadraped with cat/cow positions for incr scapular stability/movement and decr spasticity with min cueing. Wt. Bearing on elbows in modified plank position for incr scapular stability.  Scapular exercises in prone with shoulders in ext and min facilitation for RUE.    NMES x89mn to R wrist/finger extensors, 50pps, 250 pulse width, intensity=18 with no adverse reactions for neuro re-ed and decr tone (10sec cycle, 2sec ramp)(during part of splint fabrication)    Splinting:  Began fabrication of full composite extension splint for incr stretch/decr tone.  Will need to compete and instruct pt in wear/care next session.  Began checking STGs and discussing progress.--see goals section.  OT Education - 11/17/15 1518    Education Details slow, light, controlled movement including raising arm and grasping (to grade movement and decr tone)   Person(s) Educated Patient;Spouse   Methods Demonstration;Explanation;Tactile cues;Verbal cues   Comprehension Verbalized understanding;Returned demonstration;Verbal cues required;Tactile cues required          OT Short Term Goals - 11/17/15 1529    OT SHORT TERM GOAL #2   Title Pt will demo at least 80* R  shoulder flex with minimal compensation in prep for functional reach.   Baseline 70*   Time 6   Status On-going  08/22/15  80-90 with min-mod compensation   OT SHORT TERM GOAL #3   Title Pt will demo at least 50% gross finger ext for grasp/release of objects.   Baseline 30-50% on 09/23/15   Time 6   Period Weeks   Status On-going  09/22/14  30-50% varies with spasticity; 10/20/15 50% inconsistently due to spasticity;  11/17/15 fluctuates due to spasticity but up to 75% at times   OT SHORT TERM GOAL #7   Title Pt will increase RUE functional use as evidenced by performing 4 blocks on box/ blocks test.   Baseline 1 block, 3 blocks on 09/23/15   Time 6   Period Weeks   Status New   OT SHORT TERM GOAL #8   Title Pt will be independent with newly updated HEP.    Baseline independent with previsous HEP-09/23/15   Time 6   Period Weeks   Status Achieved  10/20/15  updated HEP; met 11/17/15   OT SHORT TERM GOAL  #9   TITLE Pt will use RUE as a gross assist 60% of the time for ADLs/ IADLs.   Baseline uses grossly 50% of the time as gross A/ stabilizer-09/23/15   Time 6   Period Weeks   Status On-going           OT Long Term Goals - 10/20/15 1635    OT LONG TERM GOAL #2   Title Pt will demo at least 90* R shoulder flex with minimal compensation in prep for functional reach.   Baseline 80-90* with mod compensations-09/23/15   Time 12   Period Weeks   Status On-going  09/23/15-80-90* with mod compensations   OT LONG TERM GOAL #3   Title Pt will improve RUE functional reaching and coordination as shown by scoring at least 8 on box and blocks test.   Baseline 1,3 blocks on 09/23/15   Time 12   Period Weeks   Status On-going  09/23/15- 1, 3 blocks- avg 2 blocks   OT LONG TERM GOAL #5   Title Pt will demo good safety awareness during functional tasks and per family report.   Baseline Pt continues to require cueing for safety 09/23/15   Time 12   Period Weeks   Status On-going   OT LONG TERM  GOAL #6   Title Pt will verbalize understanding of cognitive and visual compensation strategies prn.   Baseline Pt continues to require assistance and is not consistently using strategies, Pt requires verbal cues for strategies-09/23/15   Time 12   Period Weeks   Status On-going   OT LONG TERM GOAL #7   Title Pt will use RUE as a gross A  75% of the time for ADLs/ IADLS    Baseline Pt uses grossly 50% of the time-09/23/15   Time 12   Period Weeks   Status New  Plan - 11/17/15 1528    Clinical Impression Statement Pt is progressing towards goals with incr consistency with R finger ext after grasp and decr compensation overall, but spasticity continues to limit function.     Plan complete full composite extension splint and instruct pt in splint wear/care, check remaining STGs (box and blocks test and %of RUE functional use)   Consulted and Agree with Plan of Care Patient;Family member/caregiver   Family Member Consulted wife        Problem List Patient Active Problem List   Diagnosis Date Noted  . 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 Urinary retention 06/23/2015  . Acute respiratory failure ([place]) 06/23/2015  . Traumatic subarachnoid bleed with LOC of 6 hours to 24 hours ([place]) 06/23/2015  . 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 Fall 06/22/2015  . Ocular trauma of right eye 06/22/2015  . TBI (traumatic brain injury) ([place]) 06/16/2015    [facility] 11/17/2015, 3:53 PM  [facility] [address] [place], [place], [address] Phone: [phone]   Fax:  [phone]  Name: Jad Johansson MRN: [id number] Date of Birth: 1986-05-02   Vianne Bulls, OTR/L [facility] [address] [place], [place]  [address] [phone] phone [phone] 11/17/2015 3:53 PM

## 2015-11-24 ENCOUNTER — Encounter: Payer: Self-pay | Admitting: Occupational Therapy

## 2015-11-24 ENCOUNTER — Ambulatory Visit: Payer: BLUE CROSS/BLUE SHIELD | Admitting: Occupational Therapy

## 2015-11-24 DIAGNOSIS — R252 Cramp and spasm: Secondary | ICD-10-CM

## 2015-11-24 DIAGNOSIS — R41840 Attention and concentration deficit: Secondary | ICD-10-CM

## 2015-11-24 DIAGNOSIS — G8191 Hemiplegia, unspecified affecting right dominant side: Secondary | ICD-10-CM

## 2015-11-24 DIAGNOSIS — R279 Unspecified lack of coordination: Secondary | ICD-10-CM

## 2015-11-24 NOTE — Patient Instructions (Signed)
Your Splint This splint should initially be fitted by a healthcare practitioner.  The healthcare practitioner is responsible for providing wearing instructions and precautions to the patient, other healthcare practitioners and care provider involved in the patient's care.  This splint was custom made for you. Please read the following instructions to learn about wearing and caring for your splint.  Precautions Should your splint cause any of the following problems, remove the splint immediately and contact your therapist/physician.  Swelling  Severe Pain  Pressure Areas  Stiffness  Numbness  Do not wear your splint while operating machinery unless it has been fabricated for that purpose.  When To Wear Your Splint Where your splint according to your therapist/physician instructions. 15-30 minutes at a time for 2-3 times a day  Care and Cleaning of Your Splint 1. Keep your splint away from open flames. 2. Your splint will lose its shape in temperatures over 135 degrees Farenheit, ( in car windows, near radiators, ovens or in hot water).  Never make any adjustments to your splint, if the splint needs adjusting remove it and make an appointment to see your therapist. 3. Your splint, including the cushion liner may be cleaned with soap and lukewarm water.  Do not immerse in hot water over 135 degrees Farenheit. 4. Straps may be washed with soap and water, but do not moisten the self-adhesive portion. 5. For ink or hard to remove spots use a scouring cleanser which contains chlorine.  Rinse the splint thoroughly after using chlorine cleanser. 6.

## 2015-11-24 NOTE — Therapy (Signed)
[facility] [address] [place], [place], [address] Phone: [phone]   Fax:  [phone]  Occupational Therapy Treatment  Patient Details  Name: Dakota [name] MRN: [id number] Date of Birth: November 24, 1985 No Data Recorded  Encounter Date: 11/24/2015      OT End of Session - 11/24/15 1722    Visit Number 18   Number of Visits 24   Date for OT Re-Evaluation 11/19/15   Authorization Type BCBS (30 visit limit combined OT/PT), Medicaid secondary (20 visits 1/20-4/13/17)   Authorization Time Period  week 6/12, Medicaid visit 6/20   Authorization - Visit Number 10  PT used 7 visits this year Nurse, mental health)   Authorization - Number of Visits 23  BCBS 15+8 from PT=23   OT Start Time 1445   OT Stop Time 1538   OT Time Calculation (min) 53 min   Activity Tolerance Patient tolerated treatment well   Behavior During Therapy [facility] for tasks assessed/performed      Past Medical History  Diagnosis Date  . Hx of renal calculi     History reviewed. No pertinent past surgical history.  There were no vitals filed for this visit.  Visit Diagnosis:  Right hemiparesis ([place])  Spasticity  Lack of coordination  Inattention      Subjective Assessment - 11/24/15 1528    Subjective  Pt reports that he is not sure that he notices a difference after Botox.  "I think this (full composite ext splint) will help"   Patient is accompained by: Family member   Pertinent History Pt hospitalized on 06/16/15 for TBI with SAH, L eye injury (retrobulbar hemorrhage). Transferred to inpatient rehab on 10/13, where he remained until discharged home on 07/14/15.   Patient Stated Goals use RUE again   Currently in Pain? No/denies            OT Treatments/Exercises (OP) - 10/13/15 0001    Neurological Re-education Exercises   Shoulder Flexion AROM;Seated  min facilitation and min cues for normal movements with PVC frame     Other Exercises 1 Focus on normal movement patterns for functional movements throughout session/transitional movements with min cueing/facilitation.       Other Grasp and Release Exercises  Low range functional reaching to grasp small-medium cylinder objects along tabletop with min cueing for light grasp/grading of movement and with elbow ext along tabletop for reach (laterally and across body).Therapist facilitated stretch intermittently between grasps.  In standing, functional grasp/release of 1-2 inch objects with focus on finger/thumb extension and normal movement patterns with min facilitation/min-mod cues.       Other Weight-Bearing Exercises 1 Wt. bearing in modified quadraped with forward/backward wt. Shifts for incr scapular stability/movement and decr spasticity with min cueing.     NMES x76mn to R wrist/finger extensors, 50pps, 250 pulse width, intensity=18 with no adverse reactions for neuro re-ed and decr tone (10sec cycle, 2sec ramp)    Splinting: completed fabrication/fitting  of full composite extension splint for incr stretch/decr tone. Instructed pt in wear/care.   Instructed pt to avoid repetitive gripping and work on functional grasp instead focusing on avoiding synergy patterns which may incr spasticity.                                 OT Education - 11/24/15 1710    Education Details grading movement, avoiding compensation patterns, splint wear/care (see pt instruction)   Person(s) Educated Patient,  wife   Methods Explanation;Demonstration;Verbal cues;Tactile cues   Comprehension Verbalized understanding;Returned demonstration;Verbal cues required;Tactile cues required          OT Short Term Goals - 11/17/15 1529    OT SHORT TERM GOAL #2   Title Pt will demo at least 80* R shoulder flex with minimal compensation in prep for functional reach.   Baseline 70*   Time 6   Status On-going  08/22/15  80-90  with min-mod compensation   OT SHORT TERM GOAL #3   Title Pt will demo at least 50% gross finger ext for grasp/release of objects.   Baseline 30-50% on 09/23/15   Time 6   Period Weeks   Status On-going  09/22/14  30-50% varies with spasticity; 10/20/15 50% inconsistently due to spasticity;  11/17/15 fluctuates due to spasticity but up to 75% at times   OT SHORT TERM GOAL #7   Title Pt will increase RUE functional use as evidenced by performing 4 blocks on box/ blocks test.   Baseline 1 block, 3 blocks on 09/23/15   Time 6   Period Weeks   Status New   OT SHORT TERM GOAL #8   Title Pt will be independent with newly updated HEP.    Baseline independent with previsous HEP-09/23/15   Time 6   Period Weeks   Status Achieved  10/20/15  updated HEP; met 11/17/15   OT SHORT TERM GOAL  #9   TITLE Pt will use RUE as a gross assist 60% of the time for ADLs/ IADLs.   Baseline uses grossly 50% of the time as gross A/ stabilizer-09/23/15   Time 6   Period Weeks   Status On-going           OT Long Term Goals - 10/20/15 1635    OT LONG TERM GOAL #2   Title Pt will demo at least 90* R shoulder flex with minimal compensation in prep for functional reach.   Baseline 80-90* with mod compensations-09/23/15   Time 12   Period Weeks   Status On-going  09/23/15-80-90* with mod compensations   OT LONG TERM GOAL #3   Title Pt will improve RUE functional reaching and coordination as shown by scoring at least 8 on box and blocks test.   Baseline 1,3 blocks on 09/23/15   Time 12   Period Weeks   Status On-going  09/23/15- 1, 3 blocks- avg 2 blocks   OT LONG TERM GOAL #5   Title Pt will demo good safety awareness during functional tasks and per family report.   Baseline Pt continues to require cueing for safety 09/23/15   Time 12   Period Weeks   Status On-going   OT LONG TERM GOAL #6   Title Pt will verbalize understanding of cognitive and visual compensation strategies prn.   Baseline Pt continues to  require assistance and is not consistently using strategies, Pt requires verbal cues for strategies-09/23/15   Time 12   Period Weeks   Status On-going   OT LONG TERM GOAL #7   Title Pt will use RUE as a gross A  75% of the time for ADLs/ IADLS    Baseline Pt uses grossly 50% of the time-09/23/15   Time 12   Period Weeks   Status New               Plan - 11/24/15 1723    Clinical Impression Statement Pt progressing towards goals with incr consistency with R finger ext  after grasp.  Pt can also isolate thumb ext and 3rd digit ext.   Pt continues to need cues for compensation.   OT Treatment/Interventions Self-care/ADL training;Therapeutic exercise;Functional Mobility Training;Patient/family education;Balance training;Splinting;Manual Therapy;Neuromuscular education;Ultrasound;Iontophoresis;Energy conservation;Therapeutic exercises;Therapeutic activities;DME and/or AE instruction;Parrafin;Cryotherapy;Electrical Stimulation;Fluidtherapy;Cognitive remediation/compensation;Visual/perceptual remediation/compensation;Passive range of motion;Moist [employer] check box and blocks test, check STGs (RUE functional use %, check splint wear/care)   Consulted and Agree with Plan of Care Patient;Family member/caregiver   Family Member Consulted wife        Problem List Patient Active Problem List   Diagnosis Date Noted  . 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 Urinary retention 06/23/2015  . Acute respiratory failure ([place]) 06/23/2015  . Traumatic subarachnoid bleed with LOC of 6 hours to 24 hours ([place]) 06/23/2015  . Right spastic hemiparesis ([place]) 06/23/2015  . Fall 06/22/2015  . Ocular trauma of right eye 06/22/2015  . TBI (traumatic brain injury) ([place]) 06/16/2015    [facility] 11/24/2015, 5:42 PM  [facility] [address] [place], [place], [address] Phone: [phone]   Fax:  [phone]  Name: Dakota [name] MRN: [id number] Date of Birth: Apr 24, 1986  Vianne Bulls, OTR/L [facility] [address] [place], [place]  [address] [phone] phone [phone] 11/24/2015 5:42 PM

## 2015-12-01 ENCOUNTER — Ambulatory Visit: Payer: BLUE CROSS/BLUE SHIELD | Admitting: Occupational Therapy

## 2015-12-01 DIAGNOSIS — G8191 Hemiplegia, unspecified affecting right dominant side: Secondary | ICD-10-CM

## 2015-12-01 DIAGNOSIS — R279 Unspecified lack of coordination: Secondary | ICD-10-CM

## 2015-12-01 DIAGNOSIS — R41840 Attention and concentration deficit: Secondary | ICD-10-CM

## 2015-12-01 DIAGNOSIS — R252 Cramp and spasm: Secondary | ICD-10-CM

## 2015-12-01 NOTE — Therapy (Signed)
[facility] [address] [place], [place], [address] Phone: [phone]   Fax:  [phone]  Occupational Therapy Treatment  Patient Details  Name: Dakota [name] MRN: [id number] Date of Birth: [birth year] No Data Recorded  Encounter Date: 12/01/2015      OT End of Session - 12/01/15 1512    Visit Number 19   Number of Visits 24   Date for OT Re-Evaluation 11/19/15   Authorization Type BCBS (30 visit limit combined OT/PT), Medicaid secondary (20 visits 1/20-4/13/17)   Authorization Time Period  week 7/12, Medicaid visit 7/20   Authorization - Visit Number 11  PT used 7 visits this year Nurse, mental health)   Authorization - Number of Visits 23  BCBS 15+8 from PT=23   OT Start Time 1451   OT Stop Time 1534   OT Time Calculation (min) 43 min   Activity Tolerance Patient tolerated treatment well   Behavior During Therapy WFL for tasks assessed/performed      Past Medical History  Diagnosis Date  . Hx of renal calculi     No past surgical history on file.  There were no vitals filed for this visit.  Visit Diagnosis:  Right hemiparesis ([place])  Spasticity  Lack of coordination  Inattention      Subjective Assessment - 12/01/15 1729    Subjective  wife reports that pt's hand seems more open, but that she does not see pt use RUE much   Patient is accompained by: Family member   Pertinent History Pt hospitalized on 06/16/15 for TBI with SAH, L eye injury (retrobulbar hemorrhage). Transferred to inpatient rehab on 10/13, where he remained until discharged home on 07/14/15.   Patient Stated Goals use RUE again   Currently in Pain? No/denies            Neurological Re-education Exercises    Shoulder Flexion AROM;Seated  min facilitation and min cues for normal movements with ball, then with UE ranger with min cues/facilitation   Chest press with ball in sitting with min cues   Other Exercises  1 Focus on normal movement patterns for functional movements throughout session/transitional movements with min cueing/facilitation.    Folding towels with min-mod cues for normal movement patterns and incr use of RUE.         Grasp/release Picking up 1-inch blocks with focus on 2point pinch with min cues.            NMES x2mn to R wrist/finger extensors, 50pps, 250 pulse width, intensity=17 with no adverse reactions for neuro re-ed and decr tone (10sec cycle, 2sec ramp) while instructing pt in ways to incr RUE functional use at home during ADLs                       assessed STGs and discussed progress--see goals section.                  OT Education - 12/01/15 1729    Education Details avoiding compensation patterns, grading movement, Ways to incr RUE functional use; use of normal movement patterns   Person(s) Educated Patient   Methods Explanation;Verbal cues;Tactile cues;Demonstration   Comprehension Verbalized understanding;Returned demonstration;Verbal cues required;Tactile cues required          OT Short Term Goals - 12/01/15 1725    OT SHORT TERM GOAL #2   Title Pt will demo at least 80* R shoulder flex with minimal compensation in prep for functional reach.  Baseline 70*   Time 6   Status On-going  08/22/15  80-90 with min-mod compensation   OT SHORT TERM GOAL #3   Title Pt will demo at least 50% gross finger ext for grasp/release of objects.   Baseline 30-50% on 09/23/15   Time 6   Period Weeks   Status Achieved  09/22/14  30-50% varies with spasticity; 10/20/15 50% inconsistently due to spasticity;  11/17/15 fluctuates due to spasticity but up to 75% at times; met 12/01/15   OT SHORT TERM GOAL #7   Title Pt will increase RUE functional use as evidenced by performing 4 blocks on box/ blocks test.   Baseline 1 block, 3 blocks on 09/23/15   Time 6   Period Weeks   Status Achieved  12/01/15:  14 blocks   OT SHORT  TERM GOAL #8   Title Pt will be independent with newly updated HEP.    Baseline independent with previsous HEP-09/23/15   Time 6   Period Weeks   Status Achieved  10/20/15  updated HEP; met 11/17/15   OT SHORT TERM GOAL  #9   TITLE Pt will use RUE as a gross assist 60% of the time for ADLs/ IADLs.   Baseline uses grossly 50% of the time as gross A/ stabilizer-09/23/15   Time 6   Period Weeks   Status On-going  12/01/15:  50% per pt            OT Long Term Goals - 10/20/15 1635    OT LONG TERM GOAL #2   Title Pt will demo at least 90* R shoulder flex with minimal compensation in prep for functional reach.   Baseline 80-90* with mod compensations-09/23/15   Time 12   Period Weeks   Status On-going  09/23/15-80-90* with mod compensations   OT LONG TERM GOAL #3   Title Pt will improve RUE functional reaching and coordination as shown by scoring at least 8 on box and blocks test.   Baseline 1,3 blocks on 09/23/15   Time 12   Period Weeks   Status On-going  09/23/15- 1, 3 blocks- avg 2 blocks   OT LONG TERM GOAL #5   Title Pt will demo good safety awareness during functional tasks and per family report.   Baseline Pt continues to require cueing for safety 09/23/15   Time 12   Period Weeks   Status On-going   OT LONG TERM GOAL #6   Title Pt will verbalize understanding of cognitive and visual compensation strategies prn.   Baseline Pt continues to require assistance and is not consistently using strategies, Pt requires verbal cues for strategies-09/23/15   Time 12   Period Weeks   Status On-going   OT LONG TERM GOAL #7   Title Pt will use RUE as a gross A  75% of the time for ADLs/ IADLS    Baseline Pt uses grossly 50% of the time-09/23/15   Time 12   Period Weeks   Status New               Plan - 12/01/15 1727    Clinical Impression Statement Pt is progressing towards goals with improved consistency with R finger ext, particularly after grasp.  Pt also demo less finger  flex in relaxed position.   Plan continue with neuro re-ed, RUE functional use   Consulted and Agree with Plan of Care Patient;Family member/caregiver   Family Member Consulted wife        Problem  List Patient Active Problem List   Diagnosis Date Noted  . 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 Urinary retention 06/23/2015  . Acute respiratory failure ([place]) 06/23/2015  . Traumatic subarachnoid bleed with LOC of 6 hours to 24 hours ([place]) 06/23/2015  . Right spastic hemiparesis ([place]) 06/23/2015  . Fall 06/22/2015  . Ocular trauma of right eye 06/22/2015  . TBI (traumatic brain injury) [facility]) 06/16/2015    [facility] 12/01/2015, 5:37 PM  [facility] [address] [place], [place], [address] Phone: [phone]   Fax:  [phone]  Name: Dakota [name] MRN: [id number] Date of Birth: 03-02-1986  Vianne Bulls, OTR/L [facility] [address] [place], [place]  [address] [phone] phone [phone] 12/01/2015 5:37 PM

## 2015-12-08 ENCOUNTER — Ambulatory Visit: Payer: BLUE CROSS/BLUE SHIELD | Admitting: Occupational Therapy

## 2015-12-08 DIAGNOSIS — R252 Cramp and spasm: Secondary | ICD-10-CM

## 2015-12-08 DIAGNOSIS — R279 Unspecified lack of coordination: Secondary | ICD-10-CM

## 2015-12-08 DIAGNOSIS — R41840 Attention and concentration deficit: Secondary | ICD-10-CM

## 2015-12-08 DIAGNOSIS — G8191 Hemiplegia, unspecified affecting right dominant side: Secondary | ICD-10-CM | POA: Diagnosis not present

## 2015-12-08 NOTE — Therapy (Signed)
[facility] [address] [place], [place], [address] Phone: [phone]   Fax:  [phone]  Occupational Therapy Treatment  Patient Details  Name: Dakota [name] MRN: [id number] Date of Birth: 1986-08-12 No Data Recorded  Encounter Date: 12/08/2015      OT End of Session - 12/08/15 1731    Visit Number 20   Number of Visits 24   Date for OT Re-Evaluation 11/19/15   Authorization Type BCBS (30 visit limit combined OT/PT), Medicaid secondary (20 visits 1/20-4/13/17)   Authorization Time Period  week 8/12, Medicaid visit 8/20   Authorization - Visit Number 12  PT used 7 visits this year Nurse, mental health)   Authorization - Number of Visits 23  BCBS 15+8 from PT=23   OT Start Time 1450   OT Stop Time 1535   OT Time Calculation (min) 45 min   Activity Tolerance Patient tolerated treatment well   Behavior During Therapy WFL for tasks assessed/performed      Past Medical History  Diagnosis Date  . Hx of renal calculi     No past surgical history on file.  There were no vitals filed for this visit.  Visit Diagnosis:  Right hemiparesis (Sc[place])  Spasticity  Lack of coordination  Inattention      Subjective Assessment - 12/08/15 1730    Subjective  Pt/wife reports that pt has been doing a lot more with RUE over the past week.  Pt reports that full composite extension splint broke, but did not bring today (pt to bring for repair next session)   Pertinent History Pt hospitalized on 06/16/15 for TBI with SAH, L eye injury (retrobulbar hemorrhage). Transferred to inpatient rehab on 10/13, where he remained until discharged home on 07/14/15.   Patient Stated Goals use RUE again   Currently in Pain? No/denies       Neuro Re-ed:    Emphasis on normal movement patterns during functional tasks including:  Sweeping with regular and push broom, functional reaching to place clothes hanger on rod, simulated eating with spoon with  foam grip (and issued for home), making vertical/horizontal lines and attempting "J" with marker, simulated drinking with cup, flipping large cards.  Pt with min-mod cues and min facilitation.  Focus on grading movement with styrofoam cup by squeezing and relaxing then focus on maintaining light grasp on cup and holding in upright position while performing mid-range shoulder flex with min-mod cues/min faciliation.   Wt. Bearing through R hand in standing/sitting to reduce tone with min cues to extend R 5th PIP with LUE.                       OT Education - 12/08/15 1729    Education Details Functional HEP for RUE   Person(s) Educated Patient;Spouse   Methods Explanation;Demonstration;Handout;Verbal cues;Tactile cues   Comprehension Verbalized understanding;Returned demonstration;Verbal cues required          OT Short Term Goals - 12/01/15 1725    OT SHORT TERM GOAL #2   Title Pt will demo at least 80* R shoulder flex with minimal compensation in prep for functional reach.   Baseline 70*   Time 6   Status On-going  08/22/15  80-90 with min-mod compensation   OT SHORT TERM GOAL #3   Title Pt will demo at least 50% gross finger ext for grasp/release of objects.   Baseline 30-50% on 09/23/15   Time 6   Period Weeks   Status Achieved  09/22/14  30-50% varies with spasticity; 10/20/15 50% inconsistently due to spasticity;  11/17/15 fluctuates due to spasticity but up to 75% at times; met 12/01/15   OT SHORT TERM GOAL #7   Title Pt will increase RUE functional use as evidenced by performing 4 blocks on box/ blocks test.   Baseline 1 block, 3 blocks on 09/23/15   Time 6   Period Weeks   Status Achieved  12/01/15:  14 blocks   OT SHORT TERM GOAL #8   Title Pt will be independent with newly updated HEP.    Baseline independent with previsous HEP-09/23/15   Time 6   Period Weeks   Status Achieved  10/20/15  updated HEP; met 11/17/15   OT SHORT TERM GOAL  #9   TITLE Pt will use  RUE as a gross assist 60% of the time for ADLs/ IADLs.   Baseline uses grossly 50% of the time as gross A/ stabilizer-09/23/15   Time 6   Period Weeks   Status On-going  12/01/15:  50% per pt            OT Long Term Goals - 12/08/15 1736    OT LONG TERM GOAL #2   Title Pt will demo at least 90* R shoulder flex with minimal compensation in prep for functional reach.   Baseline 80-90* with mod compensations-09/23/15   Time 12   Period Weeks   Status On-going  09/23/15-80-90* with mod compensations   OT LONG TERM GOAL #3   Title Pt will improve RUE functional reaching and coordination as shown by scoring at least 20 on box and blocks test.   Baseline 1,3 blocks on 09/23/15   Time 12   Period Weeks   Status Revised  09/23/15- 1, 3 blocks- avg 2 blocks; revised 12/08/15   OT LONG TERM GOAL #5   Title Pt will demo good safety awareness during functional tasks and per family report.   Baseline Pt continues to require cueing for safety 09/23/15   Time 12   Period Weeks   Status On-going   OT LONG TERM GOAL #6   Title Pt will verbalize understanding of cognitive and visual compensation strategies prn.   Baseline Pt continues to require assistance and is not consistently using strategies, Pt requires verbal cues for strategies-09/23/15   Time 12   Period Weeks   Status On-going   OT LONG TERM GOAL #7   Title Pt will use RUE as a gross A  75% of the time for ADLs/ IADLS    Baseline Pt uses grossly 50% of the time-09/23/15   Time 12   Period Weeks   Status New               Plan - 12/08/15 1732    Clinical Impression Statement Pt demo improved RUE functional use this week with improved consistency with R finger extension and hand/elbow held in more extension.  Pt is progressing towards goals.   Plan continue with neuro re-ed, RUE functional use, and repair full composite ext splint if pt brings   OT Home Exercise Plan Education issued:  initial HEP, updated HEP; RUE functional use  HEP 12/08/15   Consulted and Agree with Plan of Care Patient;Family member/caregiver   Family Member Consulted wife        Problem List Patient Active Problem List   Diagnosis Date Noted  . 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 Urinary retention 06/23/2015  . Acute respiratory failure ([place]) 06/23/2015  .  Traumatic subarachnoid bleed with LOC of 6 hours to 24 hours ([place]) 06/23/2015  . Right spastic hemiparesis ([place]) 06/23/2015  . Fall 06/22/2015  . Ocular trauma of right eye 06/22/2015  . TBI (traumatic brain injury) [facility]) 06/16/2015    [facility] 12/08/2015, 5:37 PM  [facility] [address] [place], [place], [address] Phone: [phone]   Fax:  [phone]  Name: Dakota [name] MRN: [id number] Date of Birth: 05-18-1986  Vianne Bulls, OTR/L [facility] [address] [place],   [address] [phone] phone [phone] 12/08/2015 5:37 PM

## 2015-12-08 NOTE — Patient Instructions (Signed)
Basic Activities:   Use your affected hand to perform the following activities for 20-30 minutes 1-2 times/day.  Stop activity if you experience pain.  - Wipe table top - Bring plastic cup to mouth, then return to table top and release - Try to eat finger foods with right hand or use foam over spoon to eat thicker consistency foods (mashed potatoes, apple sauce) - Flip playing cards - Pick up cotton balls and place in a container - Slide checkers on tabletop - Turn doorknob - Open/close cabinet door with handle - Fold towels - Pick up 1-inch blocks - Pick up a marker, make horizontal, vertical lines - Put empty clothes hangers on a rack, remove, and repeat - Sweep with both hands

## 2015-12-15 ENCOUNTER — Ambulatory Visit: Payer: BLUE CROSS/BLUE SHIELD | Attending: Physical Medicine & Rehabilitation | Admitting: Occupational Therapy

## 2015-12-15 DIAGNOSIS — R41840 Attention and concentration deficit: Secondary | ICD-10-CM | POA: Insufficient documentation

## 2015-12-15 DIAGNOSIS — G8191 Hemiplegia, unspecified affecting right dominant side: Secondary | ICD-10-CM | POA: Insufficient documentation

## 2015-12-15 DIAGNOSIS — R278 Other lack of coordination: Secondary | ICD-10-CM | POA: Insufficient documentation

## 2015-12-16 NOTE — Therapy (Addendum)
[facility] [address] [place], [place], [address] Phone: [phone]   Fax:  [phone]  Occupational Therapy Treatment  Patient Details  Name: Dakota [name] MRN: [id number] Date of Birth: 1986-06-21 No Data Recorded  Encounter Date: 12/15/2015      OT End of Session - 12/15/15 1535    Visit Number 21   Number of Visits 31  21+10   Date for OT Re-Evaluation 11/19/15   Authorization Type BCBS (30 visit limit combined OT/PT), Medicaid secondary (20 visits 1/20-4/13/17)   Authorization Time Period recert.  12/16/15 wk 1/12, Medicaid visit 9/20   Authorization - Visit Number 13  PT used 7 visits this year Nurse, mental health)   Authorization - Number of Visits 23  BCBS 15+8 from PT=23   OT Start Time 4097   OT Stop Time 1540   OT Time Calculation (min) 47 min   Activity Tolerance Patient tolerated treatment well   Behavior During Therapy WFL for tasks assessed/performed      Past Medical History  Diagnosis Date  . Hx of renal calculi     No past surgical history on file.  There were no vitals filed for this visit.      Subjective Assessment - 12/16/15 1530    Subjective  Pt reports trying to eat apple sauce with RUE.   Patient is accompained by: Family member   Pertinent History Pt hospitalized on 06/16/15 for TBI with SAH, L eye injury (retrobulbar hemorrhage). Transferred to inpatient rehab on 10/13, where he remained until discharged home on 07/14/15.   Patient Stated Goals use RUE again   Currently in Pain? No/denies                      OT Treatments/Exercises (OP) - 12/16/15 0001       Eating Drinking with RUE with min cues/facilitation and focus on normal movement patterns.  Picking up and raising cup with water with functional reach with min cues for normal movement patterns.   Neurological Re-education Exercises   Shoulder Flexion AAROM;Right;Seated  with PVC frame for mid-high range with min  facilitation   Other Exercises 1 Elbow flex/ext and chest press with BUEs AAROM with min-mod cues and min facilitation for normal movement patterns.   Other Exercises 2 high range shoulder flex with UE ranger with min facilitation/cues   Other Grasp and Release Exercises  Low range grasp/release of large ball with BUEs    Seated with weight on hand with body on arm movements/trunk rotation with min cues and with tricep activitation for decr tone   Electrical Stimulation   Electrical Stimulation Location dorsal forearm   Electrical Stimulation Action wrist/finger ext   Electrical Stimulation Parameters x63mn, 50pps, 250 pulse width, intensity=20, 10sec cycle and 3sec ramp x660m while therapist repaired full composite ext splint with no adverse reactions.   Electrical Stimulation Goals Tone;Neuromuscular facilitation   Splinting   Splinting Repaired full composite extensiion splint forearm piece.                OT Education - 12/16/15 1530    Education Details Practice eating/drinking with RUE at home; resume use of full composite ext splint; how to improve wrist control (wrist ext while holding cup); supination at home   Person(s) Educated Patient;Spouse   Methods Explanation;Verbal cues;Demonstration   Comprehension Verbalized understanding;Returned demonstration          OT Short Term Goals - 12/01/15 1725    OT  SHORT TERM GOAL #2   Title Pt will demo at least 80* R shoulder flex with minimal compensation in prep for functional reach.   Baseline 70*   Time 6   Status On-going  08/22/15  80-90 with min-mod compensation   OT SHORT TERM GOAL #3   Title Pt will demo at least 50% gross finger ext for grasp/release of objects.   Baseline 30-50% on 09/23/15   Time 6   Period Weeks   Status Achieved  09/22/14  30-50% varies with spasticity; 10/20/15 50% inconsistently due to spasticity;  11/17/15 fluctuates due to spasticity but up to 75% at times; met 12/01/15   OT SHORT TERM GOAL  #7   Title Pt will increase RUE functional use as evidenced by performing 4 blocks on box/ blocks test.   Baseline 1 block, 3 blocks on 09/23/15   Time 6   Period Weeks   Status Achieved  12/01/15:  14 blocks   OT SHORT TERM GOAL #8   Title Pt will be independent with newly updated HEP.    Baseline independent with previsous HEP-09/23/15   Time 6   Period Weeks   Status Achieved  10/20/15  updated HEP; met 11/17/15   OT SHORT TERM GOAL  #9   TITLE Pt will use RUE as a gross assist 60% of the time for ADLs/ IADLs.   Baseline uses grossly 50% of the time as gross A/ stabilizer-09/23/15   Time 6   Period Weeks   Status On-going  12/01/15:  50% per pt            OT Long Term Goals - 12/16/15 1544    OT LONG TERM GOAL #2   Title Pt will demo at least 90* R shoulder flex with minimal compensation in prep for functional reach.   Baseline 80-90* with mod compensations-09/23/15   Time 12   Period Weeks   Status On-going  09/23/15-80-90* with mod compensations   OT LONG TERM GOAL #3   Title Pt will improve RUE functional reaching and coordination as shown by scoring at least 20 on box and blocks test.   Baseline 1,3 blocks on 09/23/15   Time 12   Period Weeks   Status Revised  09/23/15- 1, 3 blocks- avg 2 blocks; revised 12/08/15   OT LONG TERM GOAL #5   Title Pt will demo good safety awareness during functional tasks and per family report.   Baseline Pt continues to require cueing for safety 09/23/15   Time 12   Period Weeks   Status On-going  12/15/15 needs occasional min cueing for safety with exercise attempts (trying to lift weights, push-up)--pt instructed against this.   OT LONG TERM GOAL #6   Title Pt will verbalize understanding of cognitive and visual compensation strategies prn.   Baseline Pt continues to require assistance and is not consistently using strategies, Pt requires verbal cues for strategies-09/23/15   Time 12   Period Weeks   Status On-going  12/15/15: improving   OT  LONG TERM GOAL #7   Title Pt will use RUE as a gross A  75% of the time for ADLs/ IADLS    Baseline Pt uses grossly 50% of the time-09/23/15   Time 12   Period Weeks   Status On-going               Plan - 12/16/15 1541    Clinical Impression Statement Pt continues to improve with RUE functional use and was  able to drink from cup without assist/spills during session.  Pt would benefit from continued occupational therapy to address remaining goals.   Rehab Potential Good   OT Frequency 1x / week   OT Duration Other (comment)  10 weeks   OT Treatment/Interventions Self-care/ADL training;Therapeutic exercise;Functional Mobility Training;Patient/family education;Balance training;Splinting;Manual Therapy;Neuromuscular education;Ultrasound;Iontophoresis;Energy conservation;Therapeutic exercises;Therapeutic activities;DME and/or AE instruction;Parrafin;Cryotherapy;Electrical Stimulation;Fluidtherapy;Cognitive remediation/compensation;Visual/perceptual remediation/compensation;Passive range of motion;Moist [employer] continue with neuro re-ed, RUE functional use; pt making steady progress, but may spread out visits due to visit limit/financial concerns and anticipation of more Botox    OT Home Exercise Plan Education issued:  initial HEP, updated HEP; RUE functional use HEP 12/08/15   Consulted and Agree with Plan of Care Patient;Family member/caregiver   Family Member Consulted wife      Patient will benefit from skilled therapeutic intervention in order to improve the following deficits and impairments:  Decreased cognition, Decreased mobility, Decreased strength, Impaired vision/preception, Impaired UE functional use, Pain, Decreased knowledge of use of DME, Decreased balance, Impaired tone, Decreased safety awareness, Decreased coordination, Decreased range of motion, Impaired sensation  Visit Diagnosis: Hemiplegia, unspecified affecting right dominant side ([place]) - Plan: Ot  plan of care cert/re-cert  Other lack of coordination - Plan: Ot plan of care cert/re-cert  Attention and concentration deficit - Plan: Ot plan of care cert/re-cert    Problem List Patient Active Problem List   Diagnosis Date Noted  . 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 Urinary retention 06/23/2015  . Acute respiratory failure ([place]) 06/23/2015  . Traumatic subarachnoid bleed with LOC of 6 hours to 24 hours ([place]) 06/23/2015  . Right spastic hemiparesis ([place]) 06/23/2015  . Fall 06/22/2015  . Ocular trauma of right eye 06/22/2015  . TBI (traumatic brain injury) ([place]) 06/16/2015    [facility] 12/16/2015, 4:29 PM  [facility] [address] [place], [place], [address] Phone: [phone]   Fax:  [phone]  Name: Dakota [name] MRN: [id number] Date of Birth: 04/27/1986  Vianne Bulls, OTR/L [facility] [address] [place], [place]  [address] [phone] phone [phone] 12/16/2015 4:29 PM

## 2015-12-22 ENCOUNTER — Ambulatory Visit: Payer: BLUE CROSS/BLUE SHIELD | Admitting: Occupational Therapy

## 2015-12-22 DIAGNOSIS — R278 Other lack of coordination: Secondary | ICD-10-CM

## 2015-12-22 DIAGNOSIS — G8191 Hemiplegia, unspecified affecting right dominant side: Secondary | ICD-10-CM

## 2015-12-22 NOTE — Patient Instructions (Addendum)
ROM: Abduction - Wand   Holding wand with right hand palm up, push wand directly out to side, leading with other hand palm down, until stretch is felt. Hold 5 seconds. Repeat 10 times per set. Do 2-3 sessions per day.   Active Assistive Shoulder External Rotation    SIT with stick at waist level, LEFT palm up, other palm down. Step to side, push forearm out from body with hand palm down, and keep elbows bent. Hold. Side step and return to start position. Perform 10 reps. 1X DAY   Scapular Retraction (Prone)   Lie with arms at sides. Pinch shoulder blades together and raise arms a few inches from floor. Repeat 15 times per set. Do 1 sessions per day.      ** Hold cup in hand raise arm up with elbow straight without letting cup tilt.  5-10x  ** Hold cup in hand and extend wrist back.  5-10x

## 2015-12-22 NOTE — Therapy (Signed)
[facility] [address] [place], [place], [address] Phone: [phone]   Fax:  [phone]  Occupational Therapy Treatment  Patient Details  Name: Dakota [name] MRN: [id number] Date of Birth: 1986/01/11 No Data Recorded  Encounter Date: 12/22/2015      OT End of Session - 12/22/15 1538    Visit Number 22   Number of Visits 31  21+10   Date for OT Re-Evaluation 02/23/16   Authorization Type BCBS (30 visit limit combined OT/PT), Medicaid secondary (20 visits 1/20-4/13/17)   Authorization Time Period recert.  12/16/15 wk 2/10, Medicaid visit 10/20   Authorization - Visit Number 14  PT used 7 visits this year Nurse, mental health)   Authorization - Number of Visits 23  BCBS 15+8 from PT=23   OT Start Time 0240   OT Stop Time 1540   OT Time Calculation (min) 47 min   Activity Tolerance Patient tolerated treatment well   Behavior During Therapy WFL for tasks assessed/performed      Past Medical History  Diagnosis Date  . Hx of renal calculi     No past surgical history on file.  There were no vitals filed for this visit.      Subjective Assessment - 12/22/15 1641    Subjective  Pt reports practicing drinking with empty cup with RUE.  "My brain is slow"   Patient is accompained by: Family member   Pertinent History Pt hospitalized on 06/16/15 for TBI with SAH, L eye injury (retrobulbar hemorrhage). Transferred to inpatient rehab on 10/13, where he remained until discharged home on 07/14/15.   Patient Stated Goals use RUE again   Currently in Pain? No/denies      Neuro re-ed:   Scapular ex in prone with shoulders in ext and abduction with min facilitation x15 each.  Wt. Bearing in cat/cow positions for decr tone, incr scapular/core mobility/stability and body on arm movements with min v.c.  Wt. Bearing in ER with BUEs to bridge off mat x2 but discontinued due to discomfort.  AAROM shoulder flex with PVC frame,  hemi-glide with min facilitation/cues for normal movement patterns.  AAROM shoulder flex with UE ranger for high range shoulder flex with min cueing/facilitation for control/compensation.  Using RUE to open/close cabinets/drawers with handles with min cueing/facilitation for normal movement patterns.  Picking up and carrying cylinder objects to place on table with min-mod difficulty drops and cues for normal movement patters/avoid trunk compensation.  Flipping large cards with min-mod facilitation and cues  for finger ext and supination/wrist movement without shoulder/trunk compensation.  Emphasized posture.      AROM shoulder flex while holding cup with min cues for normal movement patterns.  Then wrist ext (as able to neutral) while holding cup with min cues.                          OT Education - 12/22/15 1642    Education Details Added to HEP--see pt instructions   Person(s) Educated Patient   Methods Explanation;Demonstration;Verbal cues;Handout;Tactile cues   Comprehension Verbalized understanding;Returned demonstration;Verbal cues required;Tactile cues required          OT Short Term Goals - 12/01/15 1725    OT SHORT TERM GOAL #2   Title Pt will demo at least 80* R shoulder flex with minimal compensation in prep for functional reach.   Baseline 70*   Time 6   Status On-going  08/22/15  80-90 with min-mod compensation  OT SHORT TERM GOAL #3   Title Pt will demo at least 50% gross finger ext for grasp/release of objects.   Baseline 30-50% on 09/23/15   Time 6   Period Weeks   Status Achieved  09/22/14  30-50% varies with spasticity; 10/20/15 50% inconsistently due to spasticity;  11/17/15 fluctuates due to spasticity but up to 75% at times; met 12/01/15   OT SHORT TERM GOAL #7   Title Pt will increase RUE functional use as evidenced by performing 4 blocks on box/ blocks test.   Baseline 1 block, 3 blocks on 09/23/15   Time 6   Period Weeks   Status  Achieved  12/01/15:  14 blocks   OT SHORT TERM GOAL #8   Title Pt will be independent with newly updated HEP.    Baseline independent with previsous HEP-09/23/15   Time 6   Period Weeks   Status Achieved  10/20/15  updated HEP; met 11/17/15   OT SHORT TERM GOAL  #9   TITLE Pt will use RUE as a gross assist 60% of the time for ADLs/ IADLs.   Baseline uses grossly 50% of the time as gross A/ stabilizer-09/23/15   Time 6   Period Weeks   Status On-going  12/01/15:  50% per pt            OT Long Term Goals - 12/16/15 1544    OT LONG TERM GOAL #2   Title Pt will demo at least 90* R shoulder flex with minimal compensation in prep for functional reach.   Baseline 80-90* with mod compensations-09/23/15   Time 12   Period Weeks   Status On-going  09/23/15-80-90* with mod compensations   OT LONG TERM GOAL #3   Title Pt will improve RUE functional reaching and coordination as shown by scoring at least 20 on box and blocks test.   Baseline 1,3 blocks on 09/23/15   Time 12   Period Weeks   Status Revised  09/23/15- 1, 3 blocks- avg 2 blocks; revised 12/08/15   OT LONG TERM GOAL #5   Title Pt will demo good safety awareness during functional tasks and per family report.   Baseline Pt continues to require cueing for safety 09/23/15   Time 12   Period Weeks   Status On-going  12/15/15 needs occasional min cueing for safety with exercise attempts (trying to lift weights, push-up)--pt instructed against this.   OT LONG TERM GOAL #6   Title Pt will verbalize understanding of cognitive and visual compensation strategies prn.   Baseline Pt continues to require assistance and is not consistently using strategies, Pt requires verbal cues for strategies-09/23/15   Time 12   Period Weeks   Status On-going  12/15/15: improving   OT LONG TERM GOAL #7   Title Pt will use RUE as a gross A  75% of the time for ADLs/ IADLS    Baseline Pt uses grossly 50% of the time-09/23/15   Time 12   Period Weeks   Status  On-going               Plan - 12/22/15 1646    Clinical Impression Statement Pt continues to progress with RUE functional use, but needs cueing to avoid trunk compensation.  Limited active wrist ext contributes to this.  Emphasized that pt should work on this at home (updated HEP)   Plan due to visit limits--hold until 3 weeks after Botox which is scheduled Monday per pt; continue with  neuro re-ed, RUE functional use    OT Home Exercise Plan Education issued:  initial HEP, updated HEP; RUE functional use HEP 12/08/15   Consulted and Agree with Plan of Care Patient;Family member/caregiver   Family Member Consulted wife      Patient will benefit from skilled therapeutic intervention in order to improve the following deficits and impairments:     Visit Diagnosis: Hemiplegia, unspecified affecting right dominant side ([place])  Other lack of coordination    Problem List Patient Active Problem List   Diagnosis Date Noted  . 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 Urinary retention 06/23/2015  . Acute respiratory failure ([place]) 06/23/2015  . Traumatic subarachnoid bleed with LOC of 6 hours to 24 hours ([place]) 06/23/2015  . Right spastic hemiparesis ([place]) 06/23/2015  . Fall 06/22/2015  . Ocular trauma of right eye 06/22/2015  . TBI (traumatic brain injury) [facility]) 06/16/2015    [facility] 12/22/2015, 4:49 PM  [facility] [address] [place], [place], [address] Phone: [phone]   Fax:  [phone]  Name: Dakota [name] MRN: [id number] Date of Birth: 07-Jun-1986  Vianne Bulls, OTR/L [facility] [address] [place], [place]  [address] [phone] phone [phone] 12/22/2015 4:49 PM

## 2015-12-26 ENCOUNTER — Encounter: Payer: Self-pay | Admitting: Physical Medicine & Rehabilitation

## 2015-12-26 ENCOUNTER — Encounter
Payer: BLUE CROSS/BLUE SHIELD | Attending: Physical Medicine & Rehabilitation | Admitting: Physical Medicine & Rehabilitation

## 2015-12-26 VITALS — BP 128/72 | HR 75

## 2015-12-26 DIAGNOSIS — Z87891 Personal history of nicotine dependence: Secondary | ICD-10-CM | POA: Diagnosis not present

## 2015-12-26 DIAGNOSIS — S066X4S Traumatic subarachnoid hemorrhage with loss of consciousness of 6 hours to 24 hours, sequela: Secondary | ICD-10-CM | POA: Diagnosis not present

## 2015-12-26 DIAGNOSIS — F329 Major depressive disorder, single episode, unspecified: Secondary | ICD-10-CM | POA: Diagnosis not present

## 2015-12-26 DIAGNOSIS — G8110 Spastic hemiplegia affecting unspecified side: Secondary | ICD-10-CM | POA: Diagnosis not present

## 2015-12-26 DIAGNOSIS — S066X9A Traumatic subarachnoid hemorrhage with loss of consciousness of unspecified duration, initial encounter: Secondary | ICD-10-CM | POA: Diagnosis not present

## 2015-12-26 DIAGNOSIS — Z87442 Personal history of urinary calculi: Secondary | ICD-10-CM | POA: Diagnosis not present

## 2015-12-26 DIAGNOSIS — G8111 Spastic hemiplegia affecting right dominant side: Secondary | ICD-10-CM

## 2015-12-26 NOTE — Patient Instructions (Signed)
  PLEASE CALL ME WITH ANY PROBLEMS OR QUESTIONS (#[phone]).      

## 2015-12-26 NOTE — Progress Notes (Signed)
Subjective:    Patient ID: Dakota [name], male    DOB: 06/24/1986, 30 y.o.   MRN: [id number]  HPI   Dakota [name] here in follow up of his TBI and associated deficits. He had benefits with the botox to his right fingers/wrist but he feels the benefits are wearing off. He was able to move his right hand better with improved grip. His forearm supination was also improved.   He [name] walking fairly well. He occasionally stumbles but hasn't fallen.   He would like to go back to work this fall. He works for a [employer].  His mood has been improved. He has no pain. He's off all oral meds currently.  Pain Inventory Average Pain 0 Pain Right Now 0 My pain [name] no pain  In the last 24 hours, has pain interfered with the following? General activity 0 Relation with others 0 Enjoyment of life 0 What TIME of day [name] your pain at its worst? no pain Sleep (in general) Fair  Pain [name] worse with: no pain Pain improves with: no pain Relief from Meds: no pain  Mobility walk without assistance how many minutes can you walk? 120 ability to climb steps?  yes do you drive?  no  Function disabled: date disabled .  Neuro/Psych No problems in this area  Prior Studies Any changes since last visit?  no  Physicians involved in your care Any changes since last visit?  no   Family History  Problem Relation Age of Onset  . Healthy Mother   . Healthy Father   . Cancer Maternal Grandmother   . Healthy Maternal Grandfather   . Heart disease Paternal Grandfather    Social History   Social History  . Marital Status: Married    Spouse Name: N/A  . Number of Children: N/A  . Years of Education: N/A   Social History Main Topics  . Smoking status: Former Smoker -- 0.50 packs/day    Types: Cigarettes  . Smokeless tobacco: Former NeurosurgeonUser    Quit date: 06/16/2015  . Alcohol Use: No  . Drug Use: No  . Sexual Activity: Not Asked   Other Topics Concern  . None   Social History Narrative    History reviewed. No pertinent past surgical history. Past Medical History  Diagnosis Date  . Hx of renal calculi    BP 128/72 mmHg  Pulse 75  SpO2 100%  Opioid Risk Score:   Fall Risk Score:  `1  Depression screen PHQ 2/9  [facility] 12/26/2015 08/09/2015 08/09/2015  Decreased Interest 0 0 0  Down, Depressed, Hopeless 1 1 0  PHQ - 2 Score 1 1 0     Review of Systems  All other systems reviewed and are negative.      Objective:   Physical Exam  HENT:  Head: Normocephalic. Left eye and face trauma Eyes: Right eye exhibits no discharge.  Left ptosis. .  Neck: Normal range of motion. Neck supple.  Cardiovascular: Normal rate and regular rhythm.  No murmur heard.  Respiratory: Effort normal and breath sounds normal. No respiratory distress. He has no wheezes.  GI: Soft. Bowel sounds are normal. He exhibits no distension. There [name] no tenderness. No rebound Musculoskeletal: He exhibits tenderness. Minimal edema in LUE.  Neurological: He [name] alert and oriented. able to open lid to about 90% currently on left.. Left pupil dilated and now dose constrict slightly to light. Tracks almost completely in medial/lateral planes. Does lack  some upward movement on left eye.  Right facial weakness with mild to moderate dysarthria and right tongue deviation. RUE: 3/5 shoulder, 3biceps, triceps, 2+ to 3 finger and wrist flexion, 1- extension/ 2 HI. Tone right wrist/finger flexors/pronators 1-2/4. RLE: grossly 4+/5 HF,4/5 KE, 4/5 ADF/APF.  5/5 on the left upper and left lower extremity. Gait mechanics are fairly good. He utilizes excessive hip swing in advancing the right leg but overall it's mild.  Sensation intact to light touch.  Psychiatric: His affect [name] more dynamic. Improved insight and awareness.    Assessment & Plan:   1. 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  2. ?Reactive depression/imsomnia: off of medicatoin now.  3. Left retrobulbar hemorrhage with RAPD: had had substantial natural recovery  -per optho.  4. Spasticity: tone in RLE improved with AFO and increase mobility, stretching with therapies and on his own-  -off baclofen  -splinting/ROM  - botox to right wrist/finger flexors and pronator teres---200 units.   Fifteen minutes of face to face patient care time were spent during this visit. All questions were encouraged and answered.

## 2016-01-17 ENCOUNTER — Ambulatory Visit: Payer: Medicaid Other | Attending: Physical Medicine & Rehabilitation | Admitting: Occupational Therapy

## 2016-01-17 DIAGNOSIS — G8191 Hemiplegia, unspecified affecting right dominant side: Secondary | ICD-10-CM | POA: Insufficient documentation

## 2016-01-17 DIAGNOSIS — R41840 Attention and concentration deficit: Secondary | ICD-10-CM | POA: Diagnosis present

## 2016-01-17 DIAGNOSIS — R278 Other lack of coordination: Secondary | ICD-10-CM | POA: Diagnosis present

## 2016-01-17 NOTE — Therapy (Signed)
[facility] [address] [place], [place], [address] Phone: [phone]   Fax:  [phone]  Occupational Therapy Treatment  Patient Details  Name: Adel Burch MRN: [id number] Date of Birth: 11/26/1985 No Data Recorded  Encounter Date: 01/17/2016      OT End of Session - 01/17/16 1525    Visit Number 23   Number of Visits 31  21+10   Date for OT Re-Evaluation 02/23/16   Authorization Type BCBS (30 visit limit combined OT/PT), Medicaid secondary (20 visits 1/20-4/13/17)   Authorization Time Period recert.  12/16/15 wk 3/10, Medicaid visit ?11/20   Authorization - Visit Number 15  PT used 7 visits this year Nurse, mental health)   Authorization - Number of Visits 23  BCBS 15+8 from PT=23   OT Start Time 1455   OT Stop Time 1545   OT Time Calculation (min) 50 min   Activity Tolerance Patient tolerated treatment well   Behavior During Therapy WFL for tasks assessed/performed      Past Medical History  Diagnosis Date  . Hx of renal calculi     No past surgical history on file.  There were no vitals filed for this visit.      Subjective Assessment - 01/17/16 1703    Subjective  Pt reports that he did not receive Botox yet.  Scheduled for 5/22.  "I'm glad to hear I'm making improvement, because I can't always tell"   Patient is accompained by: Family member  grandmother   Pertinent History Pt hospitalized on 06/16/15 for TBI with SAH, L eye injury (retrobulbar hemorrhage). Transferred to inpatient rehab on 10/13, where he remained until discharged home on 07/14/15.   Patient Stated Goals use RUE again   Currently in Pain? No/denies             Neuro re-ed:   Scapular ex in prone with shoulders in ext x10 each.  Wt. Bearing in cat/cow positions for decr tone, incr scapular/core mobility/stability and body on arm movements with min v.c. Then progressed to sliding/stepping LUE forward while stabilizing RUE in  quadraped.  Wt. Bearing in modified side plank on R elbow with trunk/chest lift with min cueing.  Wt. Bearing in plank on elbows with min facilitation initially and min cueing.  Wt. Bearing through R hand on mat in sitting with body on arm movements to stabilize RUE and maintaining elbow ext with min v.c.  AAROM shoulder flex with PVC frame in supine and sitting with min facilitation/cueing in sitting for decr shoulder/neck compensation  Low range functional reaching to grasp/release cylinder objects to place in shoulder flex and ER with min difficulty/cues for normal movement patters/avoid trunk compensation.  Arm bike x77mn for reciprocal movement without R hand being wrapped.  Pt able to maintain good control and RUE grasp (improved attention/grasp)  AAROM chest press in supine with 1lb weighted ball with min cueing.   Self-care:  Writing name with built-up grip with approx 75% legibility (improved from last attempts).  Using BUEs to don sock with min-mod difficulty/incr time, then simulated donning pants with BUEs using theraband loop with min cuing/difficulty but improved with 2nd repetition.     Discussed progress/improvements                   OT Education - 01/17/16 1705    Education Details Updated HEP--see pt instructions   Person(s) Educated Patient   Methods Explanation;Demonstration;Verbal cues   Comprehension Verbalized understanding;Returned demonstration  OT Short Term Goals - 12/01/15 1725    OT SHORT TERM GOAL #2   Title Pt will demo at least 80* R shoulder flex with minimal compensation in prep for functional reach.   Baseline 70*   Time 6   Status On-going  08/22/15  80-90 with min-mod compensation   OT SHORT TERM GOAL #3   Title Pt will demo at least 50% gross finger ext for grasp/release of objects.   Baseline 30-50% on 09/23/15   Time 6   Period Weeks   Status Achieved  09/22/14  30-50% varies with spasticity; 10/20/15 50%  inconsistently due to spasticity;  11/17/15 fluctuates due to spasticity but up to 75% at times; met 12/01/15   OT SHORT TERM GOAL #7   Title Pt will increase RUE functional use as evidenced by performing 4 blocks on box/ blocks test.   Baseline 1 block, 3 blocks on 09/23/15   Time 6   Period Weeks   Status Achieved  12/01/15:  14 blocks   OT SHORT TERM GOAL #8   Title Pt will be independent with newly updated HEP.    Baseline independent with previsous HEP-09/23/15   Time 6   Period Weeks   Status Achieved  10/20/15  updated HEP; met 11/17/15   OT SHORT TERM GOAL  #9   TITLE Pt will use RUE as a gross assist 60% of the time for ADLs/ IADLs.   Baseline uses grossly 50% of the time as gross A/ stabilizer-09/23/15   Time 6   Period Weeks   Status On-going  12/01/15:  50% per pt            OT Long Term Goals - 12/16/15 1544    OT LONG TERM GOAL #2   Title Pt will demo at least 90* R shoulder flex with minimal compensation in prep for functional reach.   Baseline 80-90* with mod compensations-09/23/15   Time 12   Period Weeks   Status On-going  09/23/15-80-90* with mod compensations   OT LONG TERM GOAL #3   Title Pt will improve RUE functional reaching and coordination as shown by scoring at least 20 on box and blocks test.   Baseline 1,3 blocks on 09/23/15   Time 12   Period Weeks   Status Revised  09/23/15- 1, 3 blocks- avg 2 blocks; revised 12/08/15   OT LONG TERM GOAL #5   Title Pt will demo good safety awareness during functional tasks and per family report.   Baseline Pt continues to require cueing for safety 09/23/15   Time 12   Period Weeks   Status On-going  12/15/15 needs occasional min cueing for safety with exercise attempts (trying to lift weights, push-up)--pt instructed against this.   OT LONG TERM GOAL #6   Title Pt will verbalize understanding of cognitive and visual compensation strategies prn.   Baseline Pt continues to require assistance and is not consistently using  strategies, Pt requires verbal cues for strategies-09/23/15   Time 12   Period Weeks   Status On-going  12/15/15: improving   OT LONG TERM GOAL #7   Title Pt will use RUE as a gross A  75% of the time for ADLs/ IADLS    Baseline Pt uses grossly 50% of the time-09/23/15   Time 12   Period Weeks   Status On-going               Plan - 01/17/16 1707    Clinical Impression  Statement Pt continues to make progress with RUE functional use, improved attention to RUE, and incr shoulder stability for wt. bearing.  Pt continues to demo limited active wrist and composite wrist/finger extension which affects ability to grasp/release objects.   Rehab Potential Good   OT Frequency 1x / week   OT Duration Other (comment)  10 weeks   OT Treatment/Interventions Self-care/ADL training;Therapeutic exercise;Functional Mobility Training;Patient/family education;Balance training;Splinting;Manual Therapy;Neuromuscular education;Ultrasound;Iontophoresis;Energy conservation;Therapeutic exercises;Therapeutic activities;DME and/or AE instruction;Parrafin;Cryotherapy;Electrical Stimulation;Fluidtherapy;Cognitive remediation/compensation;Visual/perceptual remediation/compensation;Passive range of motion;Moist Heat;Contrast Bath   Plan hold until after Botox 5/22 (hold at least 2 weeks s/p Botox); continue with neuro re-ed, RUE functional use; assess need for cognitive/visual strategies prn   OT Home Exercise Plan Education issued:  initial HEP, updated HEP; RUE functional use HEP 12/08/15; added to HEP 12/28/15 and 01/17/16   Consulted and Agree with Plan of Care Patient   Family Member Consulted --      Patient will benefit from skilled therapeutic intervention in order to improve the following deficits and impairments:  Decreased cognition, Decreased mobility, Decreased strength, Impaired vision/preception, Impaired UE functional use, Pain, Decreased knowledge of use of DME, Decreased balance, Impaired tone, Decreased  safety awareness, Decreased coordination, Decreased range of motion, Impaired sensation  Visit Diagnosis: Hemiplegia, unspecified affecting right dominant side (HCC)  Other lack of coordination  Attention and concentration deficit    Problem List Patient Active Problem List   Diagnosis Date Noted  . 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 Urinary retention 06/23/2015  . Acute respiratory failure ([place]) 06/23/2015  . Traumatic subarachnoid bleed with LOC of 6 hours to 24 hours ([place]) 06/23/2015  . Right spastic hemiparesis ([place]) 06/23/2015  . Fall 06/22/2015  . Ocular trauma of right eye 06/22/2015  . TBI (traumatic brain injury) ([place]) 06/16/2015    [facility] 01/17/2016, 5:21 PM  [facility] [address] [place], [place], [address] Phone: [phone]   Fax:  [phone]  Name: Rivers Hamrick MRN: [id number] Date of Birth: [birth year]  Vianne Bulls, OTR/L [facility] [address] [place], [place]  [address] [phone] phone [phone] 01/17/2016 5:21 PM

## 2016-01-17 NOTE — Patient Instructions (Signed)
   1.  Try to use both hands to put on pants (sitting) and socks.  2.  Upper Extremity Extension (All-Fours)   Tighten stomach slide left arm parallel to forward. Keep trunk and right arm rigid. Repeat 10 times per set.  Do 1 sessions per day.   Oblique Abdominal Side Plank: Lowering (Eccentric)    Lie on right side on knees (bent) and elbow. Lift trunk, chest. Slowly lower trunk for 3-5 seconds. 10 reps per set, 10 times/day.  Copyright  VHI. All rights reserved.    Prone Plank (Eccentric)    On toes and elbows, pull abdomen in while stabilizing trunk. Slowly lower downward without arching back. 10 reps per set, 1 sets per day, Hold 5-10sec (count out loud and don't hold breath).  http://ecce.exer.us/242   Copyright  VHI. All rights reserved.

## 2016-01-25 ENCOUNTER — Ambulatory Visit: Payer: BLUE CROSS/BLUE SHIELD | Admitting: Physical Medicine & Rehabilitation

## 2016-01-30 ENCOUNTER — Encounter: Payer: Medicaid Other | Attending: Physical Medicine & Rehabilitation | Admitting: Physical Medicine & Rehabilitation

## 2016-01-30 ENCOUNTER — Encounter: Payer: Self-pay | Admitting: Physical Medicine & Rehabilitation

## 2016-01-30 VITALS — BP 126/60 | HR 90 | Resp 15

## 2016-01-30 DIAGNOSIS — Z87891 Personal history of nicotine dependence: Secondary | ICD-10-CM | POA: Insufficient documentation

## 2016-01-30 DIAGNOSIS — F329 Major depressive disorder, single episode, unspecified: Secondary | ICD-10-CM | POA: Insufficient documentation

## 2016-01-30 DIAGNOSIS — Z87442 Personal history of urinary calculi: Secondary | ICD-10-CM | POA: Diagnosis not present

## 2016-01-30 DIAGNOSIS — G8111 Spastic hemiplegia affecting right dominant side: Secondary | ICD-10-CM

## 2016-01-30 DIAGNOSIS — G8110 Spastic hemiplegia affecting unspecified side: Secondary | ICD-10-CM | POA: Diagnosis not present

## 2016-01-30 DIAGNOSIS — S066X4S Traumatic subarachnoid hemorrhage with loss of consciousness of 6 hours to 24 hours, sequela: Secondary | ICD-10-CM

## 2016-01-30 DIAGNOSIS — S066X9A Traumatic subarachnoid hemorrhage with loss of consciousness of unspecified duration, initial encounter: Secondary | ICD-10-CM | POA: Diagnosis present

## 2016-01-30 NOTE — Progress Notes (Signed)
Botox Injection for spasticity using needle EMG guidance Indication: Right spastic hemiparesis (HCC)  Traumatic subarachnoid bleed with LOC of 6 hours to 24 hours, sequela (HCC)   Dilution: 100 Units/ml        Total Units Injected: 200 Indication: Severe spasticity which interferes with ADL,mobility and/or  hygiene and is unresponsive to medication management and other conservative care Informed consent was obtained after describing risks and benefits of the procedure with the patient. This includes bleeding, bruising, infection, excessive weakness, or medication side effects. A REMS form is on file and signed.  Needle: 50mm injectable monopolar needle electrode  Number of units per muscle Pectoralis Major 0 units Pectoralis Minor 0 units Biceps 0 units Brachioradialis 0 units FCR 25 units FCU 25 units FDS 50 units FDP 50 units FPL 0 units Pronator Teres 25 units Pronator Quadratus 25 units   All injections were done after obtaining appropriate EMG activity and after negative drawback for blood. The patient tolerated the procedure well. Post procedure instructions were given. Return in about 3 months (around 05/01/2016).

## 2016-01-30 NOTE — Patient Instructions (Signed)
  PLEASE CALL ME WITH ANY PROBLEMS OR QUESTIONS (#[phone]).      

## 2016-02-21 ENCOUNTER — Ambulatory Visit: Payer: BLUE CROSS/BLUE SHIELD | Admitting: Occupational Therapy

## 2016-03-08 ENCOUNTER — Encounter: Payer: Self-pay | Admitting: Occupational Therapy

## 2016-03-08 NOTE — Therapy (Signed)
[facility] [address] [place], [place], [address] Phone: [phone]   Fax:  [phone]  Patient Details  Name: Dakota [name] MRN: [id number] Date of Birth: 11/28/1985 Referring Provider:  No ref. provider found  Encounter Date: 03/08/2016  OCCUPATIONAL THERAPY DISCHARGE SUMMARY  Visits from Start of Care: 23  Current functional level related to goals / functional outcomes:     OT Short Term Goals - 12/01/15 1725    OT SHORT TERM GOAL #2   Title Pt will demo at least 80* R shoulder flex with minimal compensation in prep for functional reach.   Baseline 70*   Time 6   Status Not met. 08/22/15  80-90 with min-mod compensation   OT SHORT TERM GOAL #3   Title Pt will demo at least 50% gross finger ext for grasp/release of objects.   Baseline 30-50% on 09/23/15   Time 6   Period Weeks   Status Achieved  09/22/14  30-50% varies with spasticity; 10/20/15 50% inconsistently due to spasticity;  11/17/15 fluctuates due to spasticity but up to 75% at times; met 12/01/15   OT SHORT TERM GOAL #7   Title Pt will increase RUE functional use as evidenced by performing 4 blocks on box/ blocks test.   Baseline 1 block, 3 blocks on 09/23/15   Time 6   Period Weeks   Status Achieved  12/01/15:  14 blocks   OT SHORT TERM GOAL #8   Title Pt will be independent with newly updated HEP.    Baseline independent with previsous HEP-09/23/15   Time 6   Period Weeks   Status Achieved  10/20/15  updated HEP; met 11/17/15   OT SHORT TERM GOAL  #9   TITLE Pt will use RUE as a gross assist 60% of the time for ADLs/ IADLs.   Baseline uses grossly 50% of the time as gross A/ stabilizer-09/23/15   Time 6   Period Weeks   Status Not met. 12/01/15:  50% per pt           OT Long Term Goals - 12/16/15 1544    OT LONG TERM GOAL #2   Title Pt will demo at least 90* R shoulder flex with minimal compensation in prep for functional reach.   Baseline  80-90* with mod compensations-09/23/15   Time 12   Period Weeks   Status Not met. 09/23/15-80-90* with mod compensations   OT LONG TERM GOAL #3   Title Pt will improve RUE functional reaching and coordination as shown by scoring at least 20 on box and blocks test.   Baseline 1,3 blocks on 09/23/15   Time 12   Period Weeks   Status Revised, not met 09/23/15- 1, 3 blocks- avg 2 blocks; revised 12/08/15   OT LONG TERM GOAL #5   Title Pt will demo good safety awareness during functional tasks and per family report.   Baseline Pt continues to require cueing for safety 09/23/15   Time 12   Period Weeks   Status Not met. 12/15/15 needs occasional min cueing for safety with exercise attempts (trying to lift weights, push-up)--pt instructed against this.   OT LONG TERM GOAL #6   Title Pt will verbalize understanding of cognitive and visual compensation strategies prn.   Baseline Pt continues to require assistance and is not consistently using strategies, Pt requires verbal cues for strategies-09/23/15   Time 12   Period Weeks   Status Not met. 12/15/15: improving  OT LONG TERM GOAL #7   Title Pt will use RUE as a gross A  75% of the time for ADLs/ IADLS    Baseline Pt uses grossly 50% of the time-09/23/15   Time 12   Period Weeks   Status Not met        Remaining deficits: Pt continues to demo R hemiparesis with decr dominant RUE functional use, decr coordination, decr AROM.  Visual and cognitive deficits improved, but unable to fully re-assess all goals due to not returning to OT.     Education / Equipment: Pt/family educated in [employer], ways to incr RUE functional use, strategies for ADLs/IADLs.  Pt/family verbalized understanding of education provided.  Plan: Patient agrees to discharge.  Patient goals were partially met. Patient is being discharged due to the patient's request.  Received message to cancel appointments due to financial concerns as pt "no longer has insurance." ?????         [facility] 03/08/2016, 12:56 PM  [facility] [address] [place], [place], [address] Phone: [phone]   Fax:  [place], OTR/L [facility] [address] [place], [place]  [address] [phone] phone [phone] 03/08/2016 12:56 PM

## 2016-05-01 ENCOUNTER — Encounter: Payer: Medicaid Other | Admitting: Physical Medicine & Rehabilitation

## 2016-05-06 IMAGING — DX DG CHEST 1V PORT
1 series · 1 of 1 positions shown · non-contrast
Comparison: 06/18/2015

CLINICAL DATA: Follow of endotracheal tube placement

EXAM:
PORTABLE CHEST 1 VIEW

[chest ap]
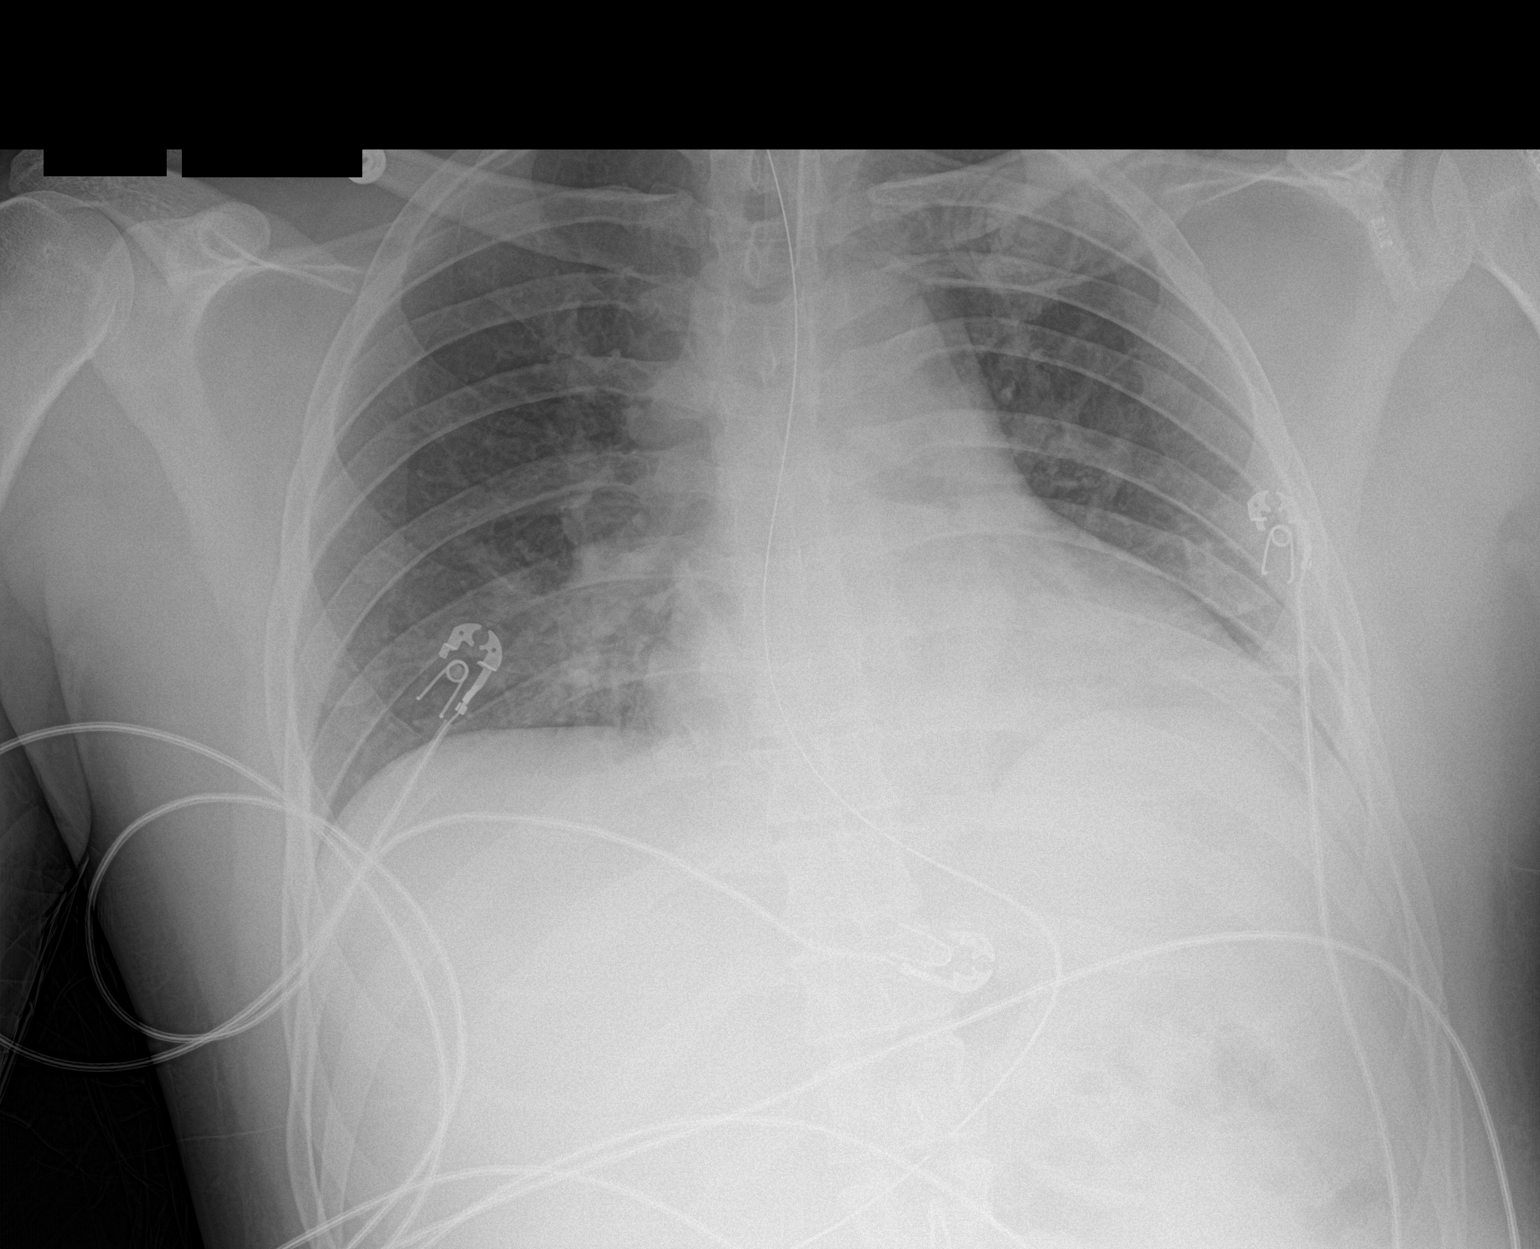

[1 of 1 positions shown; findings below may reference images not displayed]

FINDINGS: Endotracheal tube tip is 4 cm above the carina. Nasogastric tube
enters the stomach. Volume loss persists in both lower lobes but is
improving slightly. No worsening or new findings.
IMPRESSION: Improving infiltrates/ volume loss in the lower lobes.

## 2016-05-07 IMAGING — CR DG CHEST 1V PORT
1 series · 1 of 1 positions shown · non-contrast
Comparison: April 19, 2015

CLINICAL DATA: Shortness of Breath

EXAM:
PORTABLE CHEST 1 VIEW

[AP]
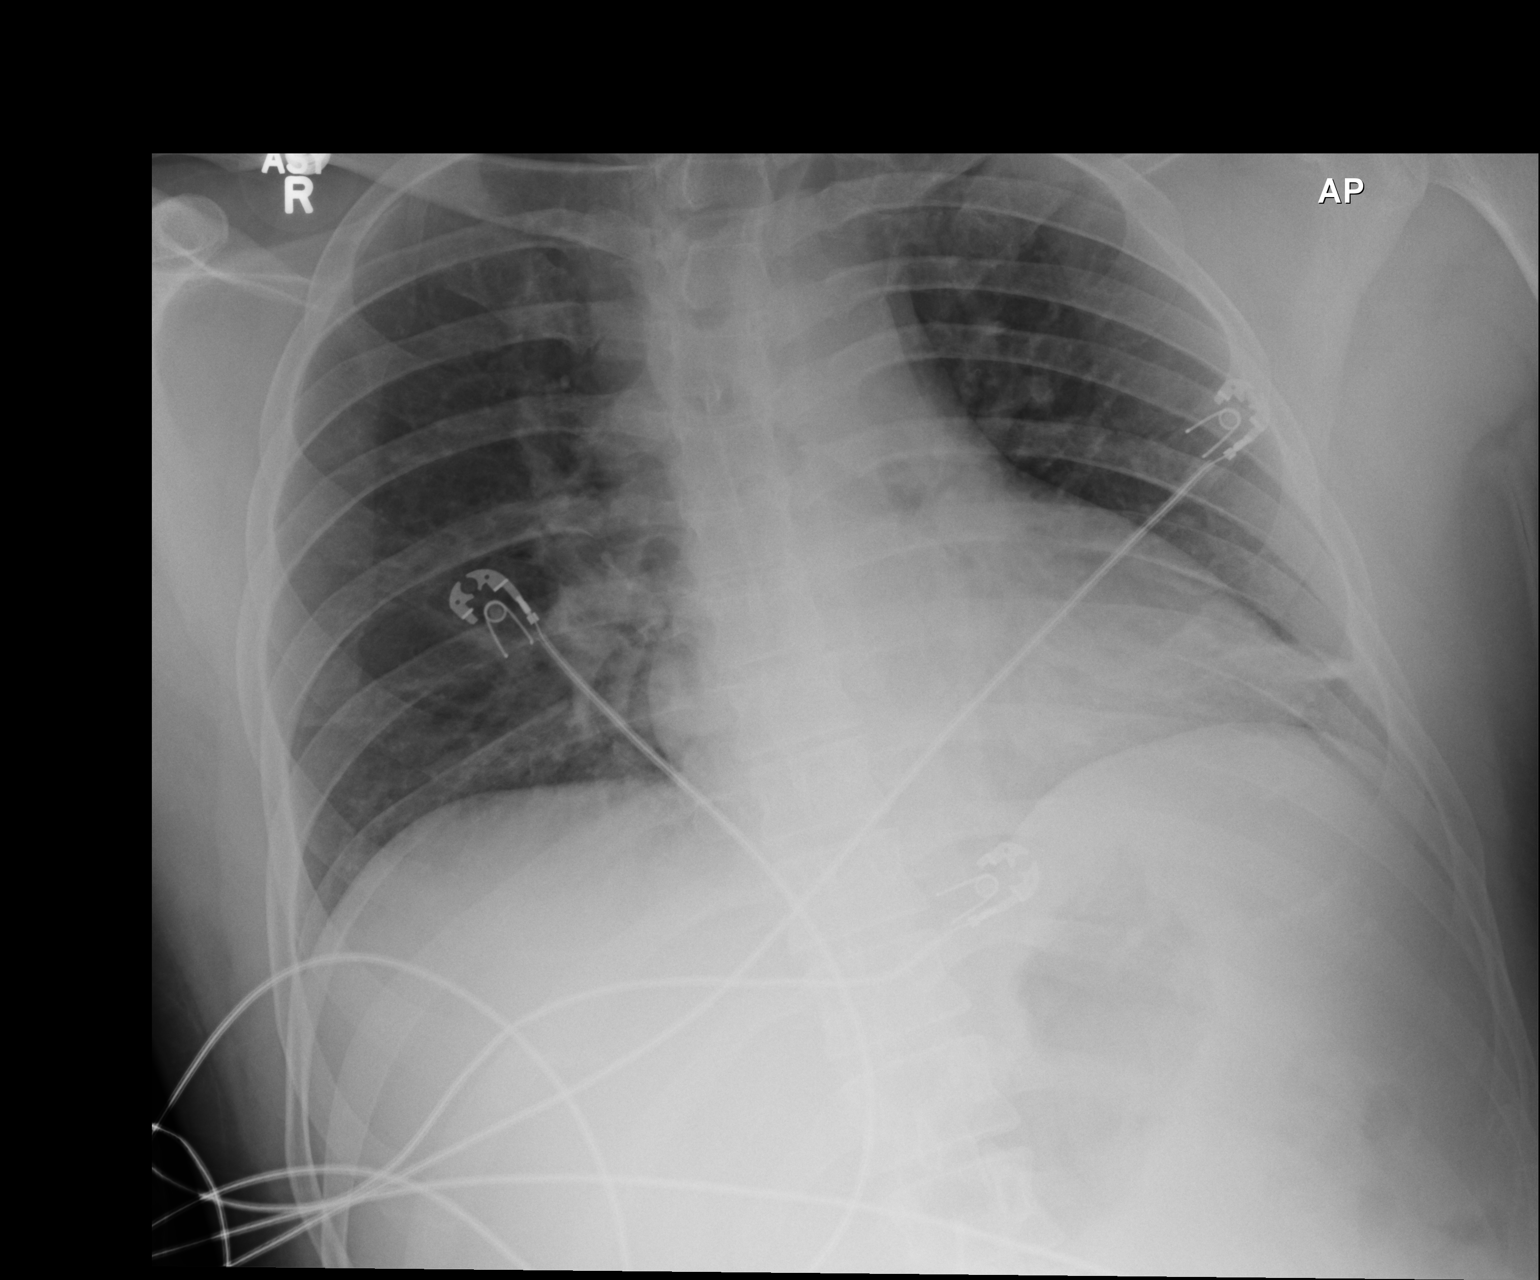

[1 of 1 positions shown; findings below may reference images not displayed]

FINDINGS: Nasogastric tube and endotracheal tube have been removed. No
pneumothorax. There is patchy infiltrate in the left base, stable.
The right lung is now clear. Heart is upper normal in size with
pulmonary vascularity within normal limits. No adenopathy.
IMPRESSION: No pneumothorax. Patchy infiltrate left base, stable. Elsewhere
lungs clear. No new opacity. No change in cardiac silhouette.

## 2016-05-21 ENCOUNTER — Encounter: Payer: Medicaid Other | Attending: Physical Medicine & Rehabilitation | Admitting: Physical Medicine & Rehabilitation

## 2016-05-21 ENCOUNTER — Telehealth: Payer: Self-pay | Admitting: Physical Medicine & Rehabilitation

## 2016-05-21 ENCOUNTER — Encounter: Payer: Self-pay | Admitting: Physical Medicine & Rehabilitation

## 2016-05-21 VITALS — BP 115/75 | HR 83

## 2016-05-21 DIAGNOSIS — Z87891 Personal history of nicotine dependence: Secondary | ICD-10-CM | POA: Diagnosis not present

## 2016-05-21 DIAGNOSIS — F329 Major depressive disorder, single episode, unspecified: Secondary | ICD-10-CM | POA: Diagnosis not present

## 2016-05-21 DIAGNOSIS — M25551 Pain in right hip: Secondary | ICD-10-CM | POA: Diagnosis not present

## 2016-05-21 DIAGNOSIS — Z87820 Personal history of traumatic brain injury: Secondary | ICD-10-CM | POA: Insufficient documentation

## 2016-05-21 DIAGNOSIS — M25561 Pain in right knee: Secondary | ICD-10-CM | POA: Insufficient documentation

## 2016-05-21 DIAGNOSIS — G8111 Spastic hemiplegia affecting right dominant side: Secondary | ICD-10-CM | POA: Insufficient documentation

## 2016-05-21 DIAGNOSIS — I609 Nontraumatic subarachnoid hemorrhage, unspecified: Secondary | ICD-10-CM | POA: Insufficient documentation

## 2016-05-21 DIAGNOSIS — S066X4S Traumatic subarachnoid hemorrhage with loss of consciousness of 6 hours to 24 hours, sequela: Secondary | ICD-10-CM

## 2016-05-21 NOTE — Telephone Encounter (Signed)
patient needs a letter stating his limitations and explaining his condition - please fax letter to [phone] Pt requested this as he was checking out and making his next appt Did not specify who this was for other than it was not for work.

## 2016-05-21 NOTE — Progress Notes (Signed)
Subjective:    Patient ID: Dakota [name], male    DOB: October 29, 1985, 30 y.o.   MRN: [id number]  HPI   Dakota [name] is here in follow up of his TBI and associated deficits. He reports that he has had numerous falls at home. He doesn't "know" what his right foot is going to "do" and it gets caught or tangled up. His knee will then buckle and he'll fall down. He fell last a week or so ago. He reports increasing right hip pain as well as pain in his right knee since the fall. Dakota [name] reports that he's stopped using his AFO also---didn't really give me a reason why.  He is off all medications currently including his antidepressant. He has stopped therapy also. This due to him losing his job and insurance.   Pain Inventory Average Pain 4 Pain Right Now 6 My pain is intermittent, burning and aching  In the last 24 hours, has pain interfered with the following? General activity 6 Relation with others 6 Enjoyment of life 6 What TIME of day is your pain at its worst? night & day Sleep (in general) Fair  Pain is worse with: walking, bending, sitting and standing Pain improves with: rest Relief from Meds: na  Mobility use a cane how many minutes can you walk? 10 ability to climb steps?  yes do you drive?  no Do you have any goals in this area?  yes  Function disabled: date disabled 06-16-15 I need assistance with the following:  meal prep, household duties and shopping  Neuro/Psych weakness numbness trouble walking spasms depression  Prior Studies Any changes since last visit?  no  Physicians involved in your care Any changes since last visit?  no   Family History  Problem Relation Age of Onset  . Healthy Mother   . Healthy Father   . Cancer Maternal Grandmother   . Healthy Maternal Grandfather   . Heart disease Paternal Grandfather    Social History   Social History  . Marital status: Married    Spouse name: N/A  . Number of children: N/A  . Years of education: N/A    Social History Main Topics  . Smoking status: Former Smoker    Packs/day: 0.50    Types: Cigarettes  . Smokeless tobacco: Former NeurosurgeonUser    Quit date: 06/16/2015  . Alcohol use No  . Drug use: No  . Sexual activity: Not Asked   Other Topics Concern  . None   Social History Narrative  . None   History reviewed. No pertinent surgical history. Past Medical History:  Diagnosis Date  . Hx of renal calculi    BP 115/75   Pulse 83   SpO2 97%   Opioid Risk Score:   Fall Risk Score:  `1  Depression screen PHQ 2/9  [facility] 12/26/2015 08/09/2015 08/09/2015  Decreased Interest 0 0 0  Down, Depressed, Hopeless 1 1 0  PHQ - 2 Score 1 1 0    Review of Systems  HENT: Negative.   Eyes: Negative.   Respiratory: Negative.   Cardiovascular: Negative.   Gastrointestinal: Negative.   Endocrine: Negative.   Genitourinary: Negative.   Musculoskeletal: Positive for gait problem.  Skin: Negative.   Allergic/Immunologic: Negative.   Neurological: Positive for weakness and numbness.  Hematological: Negative.   Psychiatric/Behavioral: Positive for dysphoric mood.       Objective:   Physical Exam HENT:  Head: Normocephalic. Left eye and face trauma Eyes: Right  eye exhibits no discharge.  Left ptosis improved.  Neck: Normal range of motion. Neck supple.  Cardiovascular: Normal rate and regular rhythm.  No murmur heard.  Respiratory: Effort normal and breath sounds normal. No respiratory distress. He has no wheezes.  GI: Soft. Bowel sounds are normal. He exhibits no distension. There is no tenderness. No rebound Musculoskeletal: He exhibits tenderness. Minimal edema in LUE.  Neurological: He is alert and oriented. able to open lid to about 90% currently on left.. Left pupil dilated and now dose constrict slightly to light. Tracks almost completely in medial/lateral planes. Does lack some upward movement on left eye.  Right facial weakness with mild to moderate  dysarthria and right tongue deviation. RUE: 3+/5 shoulder, 3+biceps, triceps, 2+ to 3 finger and wrist flexion,  2extension/ 2 HI. Tone right wrist/finger flexors/pronators 1to 1+/4. RLE: grossly 4+/5 HF,4/5 KE, 4/5 ADF/APF.  5/5 on the left upper and left lower extremity. Gait mechanics reveal circumduction of the right hip/leg. Sometimes heyperextends knee also.  Sometimes utilizes a steppage gait also due to heel cord tightness.   Sensation intact to light touch.  Psychiatric: His affect is more flat today    Assessment & Plan:   1. 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 ?Reactive depression/imsomnia: off of medicatoin now.  3. Left retrobulbar hemorrhage with RAPD: had had substantial natural recovery  -per optho.  4. Spasticity: tone in RLE improved with AFO and increase mobility, stretching with therapies and on his own-  -off baclofen  -splinting/ROM  - consider follow up botox to right wrist/fingers in the Fall 5. Right knee and hip pain---due to compensatory strategies and falls. -needs to start wearing AFO again today -reviewed stretches for right heel and hip -addressed improved mechanics of gait, better knee bend and heel strike -naproxen 220mg  OTC bid for pain for the short term   25 minutes of face to face patient care time were spent during this visit. All questions were encouraged and answered.

## 2016-05-21 NOTE — Patient Instructions (Signed)
  1. WORK ON YOUR RIGHT LEG FORM WHEN WALKING---USE GOOD KNEE BEND AND TOE LIFT TO CLEAR YOU FOOT---AVOID THE LATERAL HIP SWING. ?POOL----WALKING  2. ICE TO YOUR KNEE AND HIP AS NEEDED FOR PAIN  3. NAPROXEN 220MG  (ANTI-INFLAMMATORY)---TAKE ONE PILL TWICE DAILY WITH FOOD OR DRINK  4. USE YOUR ANKLE BRACE!!!!

## 2016-05-23 NOTE — Telephone Encounter (Signed)
Letter written and signed---plesae fax to [phone]

## 2016-05-24 NOTE — Telephone Encounter (Signed)
Letter faxed. Pt aware.

## 2016-05-25 ENCOUNTER — Telehealth: Payer: Self-pay | Admitting: Physical Medicine & Rehabilitation

## 2016-05-25 NOTE — Telephone Encounter (Signed)
A letter was written for patient for his disability.  They are telling him that he needs a more detailed letter that describes his medical condition and limitations.  Any questions please call patient at [phone].

## 2016-05-25 NOTE — Telephone Encounter (Signed)
"  They" will need to specifically explain via a faxed communication what information they need. This is why I don't type letters like this without knowing what these people want.

## 2016-05-28 NOTE — Telephone Encounter (Signed)
I gave [name] [name] the fax number to our office and asked that he have the disability specifics faxed to our office.

## 2016-08-20 ENCOUNTER — Encounter: Payer: Self-pay | Admitting: Physical Medicine & Rehabilitation

## 2016-08-20 ENCOUNTER — Encounter: Payer: Self-pay | Attending: Physical Medicine & Rehabilitation | Admitting: Physical Medicine & Rehabilitation

## 2016-08-20 VITALS — BP 138/83 | HR 110

## 2016-08-20 DIAGNOSIS — G8111 Spastic hemiplegia affecting right dominant side: Secondary | ICD-10-CM | POA: Insufficient documentation

## 2016-08-20 DIAGNOSIS — M25561 Pain in right knee: Secondary | ICD-10-CM | POA: Insufficient documentation

## 2016-08-20 DIAGNOSIS — Z87891 Personal history of nicotine dependence: Secondary | ICD-10-CM | POA: Insufficient documentation

## 2016-08-20 DIAGNOSIS — M25551 Pain in right hip: Secondary | ICD-10-CM | POA: Insufficient documentation

## 2016-08-20 DIAGNOSIS — I609 Nontraumatic subarachnoid hemorrhage, unspecified: Secondary | ICD-10-CM | POA: Insufficient documentation

## 2016-08-20 DIAGNOSIS — Z87820 Personal history of traumatic brain injury: Secondary | ICD-10-CM | POA: Insufficient documentation

## 2016-08-20 DIAGNOSIS — F329 Major depressive disorder, single episode, unspecified: Secondary | ICD-10-CM

## 2016-08-20 MED ORDER — CITALOPRAM HYDROBROMIDE 10 MG PO TABS: 10.0000 mg | ORAL_TABLET | Freq: Every day | ORAL | 2 refills | Status: DC

## 2016-08-20 NOTE — Patient Instructions (Signed)
PLEASE CALL ME WITH ANY PROBLEMS OR QUESTIONS ([phone])   HAPPY HOLIDAYS!!!!                    *                * *             *   *   *         *  *   *  *  *     *  *  *  *  *  *  * *  *  *  *  *  *  *  *  *  * *               *  *               *  *               *  *  

## 2016-08-20 NOTE — Progress Notes (Signed)
Subjective:    Patient ID: Dakota PartridgeJessie [name], male    DOB: [birth year], 30 y.o.   MRN: [id number]  HPI   [name] [name] is here in follow up of his TBI. He hasn't had any falls. He has been wearing his AFO which has helped his balance. He doesn't feel that he walks "normal" with it however. His right hip and knee can bother him when he walks longer distances. He has alos had some chest pain at times after he's exercises--he thinks it might be anxiety. He also admits to being depressed although it can be situational.   He is doing some things around the house but is not involved with anything outside the home. He doesn't seem to be active in church. He is not volunteering.      Pain Inventory Average Pain 5 Pain Right Now 0 My pain is intermittent, burning and aching  In the last 24 hours, has pain interfered with the following? General activity 5 Relation with others 5 Enjoyment of life 5 What TIME of day is your pain at its worst? daytime Sleep (in general) Fair  Pain is worse with: walking, bending, sitting and standing Pain improves with: rest Relief from Meds: 0  Mobility walk with assistance ability to climb steps?  yes do you drive?  no  Function disabled: date disabled 2016 I need assistance with the following:  meal prep and household duties  Neuro/Psych weakness anxiety  Prior Studies Any changes since last visit?  no  Physicians involved in your care Any changes since last visit?  no   Family History  Problem Relation Age of Onset  . Healthy Mother   . Healthy Father   . Cancer Maternal Grandmother   . Healthy Maternal Grandfather   . Heart disease Paternal Grandfather    Social History   Social History  . Marital status: Married    Spouse name: N/A  . Number of children: N/A  . Years of education: N/A   Social History Main Topics  . Smoking status: Former Smoker    Packs/day: 0.50    Types: Cigarettes  . Smokeless tobacco: Former NeurosurgeonUser    Quit  date: 06/16/2015  . Alcohol use No  . Drug use: No  . Sexual activity: Not on file   Other Topics Concern  . Not on file   Social History Narrative  . No narrative on file   No past surgical history on file. Past Medical History:  Diagnosis Date  . Hx of renal calculi    There were no vitals taken for this visit.  Opioid Risk Score:   Fall Risk Score:  `1  Depression screen PHQ 2/9  [facility] 12/26/2015 08/09/2015 08/09/2015  Decreased Interest 0 0 0  Down, Depressed, Hopeless 1 1 0  PHQ - 2 Score 1 1 0   Review of Systems  HENT: Negative.   Eyes: Negative.   Respiratory: Negative.   Cardiovascular: Negative.   Gastrointestinal: Negative.   Endocrine: Negative.   Genitourinary: Negative.   Musculoskeletal: Negative.   Skin: Negative.   Allergic/Immunologic: Negative.   Neurological: Positive for weakness.  Hematological: Negative.   Psychiatric/Behavioral: The patient is nervous/anxious.   All other systems reviewed and are negative.      Objective:   Physical Exam HENT:  Head: Normocephalic. Left eye and face trauma healed Eyes: Right eye exhibits no discharge.  Left ptosis improved.  Neck: Normal range of motion. Neck supple.  Cardiovascular: Normal rate  and regular rhythm.  No murmur heard.  Respiratory: Effort normal and breath sounds normal. No respiratory distress. He has no wheezes.  GI: Soft. Bowel sounds are normal. He exhibits no distension. There is no tenderness. No rebound Musculoskeletal: He exhibits tenderness. Minimal edema in LUE.  Neurological: He is alert and oriented. able to open lid to about 90% currently on left.. Left pupil dilated and now dose constrict slightly to light. Tracks almost completely in medial/lateral planes. Does lack some upward movement on left eye.  Right facial weakness with mild to moderate dysarthria and right tongue deviation. RUE: 3+/5 shoulder, 3+biceps, triceps, 2+ to 3 finger and wrist  flexion,  2extension/ 2 HI. Tone right wrist/finger flexors/pronators 1to 1+/4. RLE: grossly 4+/5 HF,4/5 KE, 4/5 ADF/APF. 5/5 on the left upper and left lower extremity. Gait mechanics reveal circumduction of the right hip/leg. Sometimes heyperextends knee also.  Sometimes utilizes a steppage gait also due to heel cord tightness.   Sensation intact to light touch.  Psychiatric: His affect is very flat today    Assessment & Plan:  1. 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 ?Reactive depression/imsomnia: I think this is an issue again. Will try low dose celexa which he should have access to, 10mg  qhs. Should help with anxiety alo 3. Left retrobulbar hemorrhage with RAPD: had had substantial natural recovery  -per optho.  4. Spasticity: tone in RLE improved with AFO and increase mobility, stretching with therapies and on his own-  -off baclofen  Gait much better with AFO   5. Right knee and hip pain---due to compensatory strategies and falls. -needs to continue wearing AFO   -reiterated the ongoing need for improved mechanics of gait, better knee bend and heel strike. However he's made progress there -right hip strengthening exercises were provided. -naproxen 220mg  OTC bid prn 15 minutes of face to face patient care time were spent during this visit. All questions were encouraged and answered.

## 2016-11-26 ENCOUNTER — Telehealth: Payer: Self-pay | Admitting: Physical Medicine & Rehabilitation

## 2016-11-26 NOTE — Telephone Encounter (Signed)
[employer] called needs med records -

## 2016-11-27 NOTE — Telephone Encounter (Signed)
Error

## 2016-12-19 ENCOUNTER — Ambulatory Visit: Payer: Self-pay | Admitting: Physical Medicine & Rehabilitation

## 2017-01-21 ENCOUNTER — Encounter: Payer: Self-pay | Admitting: Physical Medicine & Rehabilitation

## 2017-02-18 ENCOUNTER — Encounter: Payer: Self-pay | Admitting: Physical Medicine & Rehabilitation

## 2017-03-27 ENCOUNTER — Ambulatory Visit: Payer: Self-pay | Admitting: Physical Medicine & Rehabilitation

## 2017-05-06 ENCOUNTER — Encounter: Payer: Self-pay | Admitting: Physical Medicine & Rehabilitation

## 2017-05-06 ENCOUNTER — Encounter: Payer: Self-pay | Attending: Physical Medicine & Rehabilitation | Admitting: Physical Medicine & Rehabilitation

## 2017-05-06 VITALS — BP 109/71 | HR 87 | Resp 14

## 2017-05-06 DIAGNOSIS — F329 Major depressive disorder, single episode, unspecified: Secondary | ICD-10-CM | POA: Insufficient documentation

## 2017-05-06 DIAGNOSIS — M25561 Pain in right knee: Secondary | ICD-10-CM | POA: Insufficient documentation

## 2017-05-06 DIAGNOSIS — M25551 Pain in right hip: Secondary | ICD-10-CM | POA: Insufficient documentation

## 2017-05-06 DIAGNOSIS — I69151 Hemiplegia and hemiparesis following nontraumatic intracerebral hemorrhage affecting right dominant side: Secondary | ICD-10-CM | POA: Insufficient documentation

## 2017-05-06 DIAGNOSIS — R252 Cramp and spasm: Secondary | ICD-10-CM | POA: Insufficient documentation

## 2017-05-06 DIAGNOSIS — G8111 Spastic hemiplegia affecting right dominant side: Secondary | ICD-10-CM

## 2017-05-06 DIAGNOSIS — Z87442 Personal history of urinary calculi: Secondary | ICD-10-CM | POA: Insufficient documentation

## 2017-05-06 DIAGNOSIS — F419 Anxiety disorder, unspecified: Secondary | ICD-10-CM | POA: Insufficient documentation

## 2017-05-06 DIAGNOSIS — Z8673 Personal history of transient ischemic attack (TIA), and cerebral infarction without residual deficits: Secondary | ICD-10-CM | POA: Insufficient documentation

## 2017-05-06 DIAGNOSIS — Z87891 Personal history of nicotine dependence: Secondary | ICD-10-CM | POA: Insufficient documentation

## 2017-05-06 NOTE — Progress Notes (Signed)
Subjective:    Patient ID: Dakota [name], male    DOB: [birth year], 31 y.o.   MRN: [id number]  HPI   Kyion is here in follow up of his TBI and polytrauma. He is here for a "routine" check up. He is trying to exercise more, lifting weights, using thera-bands, walking. He tells me he stretches his hand/wrist daily. He denies any falls.   He uses a right knee brace, and his right AFO for gait/balance.   He remains married and has two kids. He helps at home. His wife just graduated as a CMA.  His kids are 4 and 62 yo.        Pain Inventory Average Pain 2 Pain Right Now 2 My pain is sharp and burning  In the last 24 hours, has pain interfered with the following? General activity 3 Relation with others 2 Enjoyment of life 2 What TIME of day is your pain at its worst? morning, daytime  Sleep (in general) Fair  Pain is worse with: walking, bending, sitting, inactivity and standing Pain improves with: no selection Relief from Meds: no selection  Mobility walk without assistance how many minutes can you walk? 15 ability to climb steps?  yes  Function disabled: date disabled . I need assistance with the following:  dressing, meal prep, household duties and shopping  Neuro/Psych anxiety  Prior Studies Any changes since last visit?  no  Physicians involved in your care Any changes since last visit?  no   Family History  Problem Relation Age of Onset  . Healthy Mother   . Healthy Father   . Cancer Maternal Grandmother   . Healthy Maternal Grandfather   . Heart disease Paternal Grandfather    Social History   Social History  . Marital status: Married    Spouse name: N/A  . Number of children: N/A  . Years of education: N/A   Social History Main Topics  . Smoking status: Former Smoker    Packs/day: 0.50    Types: Cigarettes  . Smokeless tobacco: Former Neurosurgeon    Quit date: 06/16/2015  . Alcohol use No  . Drug use: No  . Sexual activity: Not Asked   Other  Topics Concern  . None   Social History Narrative  . None   History reviewed. No pertinent surgical history. Past Medical History:  Diagnosis Date  . Hx of renal calculi    BP 109/71 (BP Location: Left Arm, Patient Position: Sitting, Cuff Size: Normal)   Pulse 87   Resp 14   SpO2 95%   Opioid Risk Score:   Fall Risk Score:  `1  Depression screen PHQ 2/9  Depression screen [facility] 2/9 12/26/2015 08/09/2015 08/09/2015  Decreased Interest 0 0 0  Down, Depressed, Hopeless 1 1 0  PHQ - 2 Score 1 1 0    Review of Systems  Constitutional: Negative.   HENT: Negative.   Eyes: Negative.   Respiratory: Negative.   Cardiovascular: Negative.   Gastrointestinal: Negative.   Endocrine: Negative.   Genitourinary: Negative.   Musculoskeletal: Positive for arthralgias.  Skin: Negative.   Allergic/Immunologic: Negative.   Neurological: Negative.   Hematological: Negative.   Psychiatric/Behavioral: The patient is nervous/anxious.        Objective:   Physical Exam  HENT:  Head: Normocephalic. Left eye and face trauma healed Eyes: Right eye exhibits no discharge.  Left ptosis improving.  Neck: Normal range of motion. Neck supple.  Cardiovascular: RRR.    Respiratory: CTA  B GI: Soft. Bowel sounds are normal. He exhibits no distension. There is no tenderness. No rebound Musculoskeletal: He exhibits tenderness. Minimal edema in LUE.  Neurological: He is alert and oriented. able to open lid to about 90% currently on left.. Left pupil dilated and now dose constrict slightly to light. Tracks almost completely in medial/lateral planes. Does lack some upward movement on left eye.  RUE 3+ to 4/5 biceps, triceps, shoulder, 2+ to 3- HI/wrist flexors. Tone right wrist/finger flexors/pronators 1+/4. RLE: remains grossly 4+/5 HF,4/5 KE, 4/5 ADF/APF. 5/5 on the left upper and left lower extremity. Gait mechanics improved with Knee brace and AFO. Sometimes heyperextends knee also. Sometimes  utilizes a steppage gait also due to heel cord tightness.  Sensation intact to light touch.  Psychiatric: His affect is very flat today   Assessment & Plan:  1. 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  -made a referral to voc rehab. Asked him to be thinking about goals.  2. ?Reactive depression/imsomnia: pt stopped his celexa  -I still believe this is a problem  -monitor for now 3. Left retrobulbar hemorrhage with RAPD: had had substantial natural recovery  -per optho.  4. Spasticity: tone in RLE improved with AFO and increase mobility, stretching with therapies and on his own-  -off baclofen  Gait much better with AFO/knee brace -will try to set up with 500u dysport sample for next month to right wrist/fingers   5. Right knee and hip pain---due to compensatory strategies, fallls---has shown some improvement -needs to continue wearing AFO   -right hip strengthening exercises were provided. -naproxen 220mg  OTC bid prn of face to face patient care time were spent during this visit. All questions were encouraged and answered.

## 2017-05-06 NOTE — Patient Instructions (Signed)
PLEASE FEEL FREE TO CALL OUR OFFICE WITH ANY PROBLEMS OR QUESTIONS ([phone])      

## 2017-06-03 ENCOUNTER — Encounter: Payer: Self-pay | Attending: Physical Medicine & Rehabilitation | Admitting: Physical Medicine & Rehabilitation

## 2017-06-03 ENCOUNTER — Telehealth: Payer: Self-pay | Admitting: Physical Medicine & Rehabilitation

## 2017-06-03 DIAGNOSIS — Z87891 Personal history of nicotine dependence: Secondary | ICD-10-CM | POA: Insufficient documentation

## 2017-06-03 DIAGNOSIS — R252 Cramp and spasm: Secondary | ICD-10-CM | POA: Insufficient documentation

## 2017-06-03 DIAGNOSIS — Z87442 Personal history of urinary calculi: Secondary | ICD-10-CM | POA: Insufficient documentation

## 2017-06-03 DIAGNOSIS — M25551 Pain in right hip: Secondary | ICD-10-CM | POA: Insufficient documentation

## 2017-06-03 DIAGNOSIS — F419 Anxiety disorder, unspecified: Secondary | ICD-10-CM | POA: Insufficient documentation

## 2017-06-03 DIAGNOSIS — M25561 Pain in right knee: Secondary | ICD-10-CM | POA: Insufficient documentation

## 2017-06-03 DIAGNOSIS — F329 Major depressive disorder, single episode, unspecified: Secondary | ICD-10-CM | POA: Insufficient documentation

## 2017-06-03 DIAGNOSIS — Z8673 Personal history of transient ischemic attack (TIA), and cerebral infarction without residual deficits: Secondary | ICD-10-CM | POA: Insufficient documentation

## 2017-06-03 DIAGNOSIS — I69151 Hemiplegia and hemiparesis following nontraumatic intracerebral hemorrhage affecting right dominant side: Secondary | ICD-10-CM | POA: Insufficient documentation

## 2017-06-03 NOTE — Telephone Encounter (Signed)
Before this patient is re-scheduled here again, it will need to be cleared with me.

## 2017-11-18 ENCOUNTER — Telehealth: Payer: Self-pay | Admitting: Physical Medicine & Rehabilitation

## 2017-11-19 NOTE — Telephone Encounter (Signed)
error 

## 2017-12-02 ENCOUNTER — Encounter: Payer: Medicaid Other | Attending: Physical Medicine & Rehabilitation | Admitting: Physical Medicine & Rehabilitation

## 2017-12-02 ENCOUNTER — Encounter: Payer: Self-pay | Admitting: Physical Medicine & Rehabilitation

## 2017-12-02 VITALS — BP 127/85 | HR 104 | Resp 16

## 2017-12-02 DIAGNOSIS — S066X9S Traumatic subarachnoid hemorrhage with loss of consciousness of unspecified duration, sequela: Secondary | ICD-10-CM | POA: Insufficient documentation

## 2017-12-02 DIAGNOSIS — F329 Major depressive disorder, single episode, unspecified: Secondary | ICD-10-CM | POA: Insufficient documentation

## 2017-12-02 DIAGNOSIS — G8111 Spastic hemiplegia affecting right dominant side: Secondary | ICD-10-CM | POA: Insufficient documentation

## 2017-12-02 DIAGNOSIS — M25561 Pain in right knee: Secondary | ICD-10-CM | POA: Insufficient documentation

## 2017-12-02 DIAGNOSIS — M25551 Pain in right hip: Secondary | ICD-10-CM | POA: Insufficient documentation

## 2017-12-02 DIAGNOSIS — Z09 Encounter for follow-up examination after completed treatment for conditions other than malignant neoplasm: Secondary | ICD-10-CM | POA: Insufficient documentation

## 2017-12-02 DIAGNOSIS — X58XXXS Exposure to other specified factors, sequela: Secondary | ICD-10-CM | POA: Insufficient documentation

## 2017-12-02 DIAGNOSIS — S069X3S Unspecified intracranial injury with loss of consciousness of 1 hour to 5 hours 59 minutes, sequela: Secondary | ICD-10-CM

## 2017-12-02 DIAGNOSIS — F419 Anxiety disorder, unspecified: Secondary | ICD-10-CM | POA: Insufficient documentation

## 2017-12-02 DIAGNOSIS — Z87891 Personal history of nicotine dependence: Secondary | ICD-10-CM | POA: Insufficient documentation

## 2017-12-02 DIAGNOSIS — Z87442 Personal history of urinary calculi: Secondary | ICD-10-CM | POA: Insufficient documentation

## 2017-12-02 NOTE — Patient Instructions (Signed)
PLEASE FEEL FREE TO CALL OUR OFFICE WITH ANY PROBLEMS OR QUESTIONS ([phone])      

## 2017-12-02 NOTE — Progress Notes (Signed)
Subjective:    Patient ID: Dakota [name], male    DOB: Jan 21, 1986, 32 y.o.   MRN: [id number]  HPI Dakota [name] is here in follow-up of his traumatic brain injury.  I last saw him in August last year.  He states not much has changed since I saw him.  He is getting around the home and taking care of his basic self-care needs.  He states that he does have problems with anxiety when he goes out of the house and when he deals with other people.  Concentration can be an issue also.  He is still wearing his AFO for his right lower extremity which is helped with his balance.  I asked him if he was depressed and he stated that he was sometimes but that he felt he was more anxious in general.  He stopped taking the Celexa that I have prescribed some time ago.  Pain Inventory Average Pain 5 Pain Right Now 1 My pain is intermittent, sharp and burning  In the last 24 hours, has pain interfered with the following? General activity 5 Relation with others 0 Enjoyment of life 5 What TIME of day is your pain at its worst? daytime Sleep (in general) Fair  Pain is worse with: walking, bending, standing and some activites Pain improves with: rest Relief from Meds: 0  Mobility walk without assistance how many minutes can you walk? 20 ability to climb steps?  yes  Function disabled: date disabled . I need assistance with the following:  meal prep and household duties  Neuro/Psych depression anxiety  Prior Studies Any changes since last visit?  no  Physicians involved in your care Any changes since last visit?  no   Family History  Problem Relation Age of Onset  . Healthy Mother   . Healthy Father   . Cancer Maternal Grandmother   . Healthy Maternal Grandfather   . Heart disease Paternal Grandfather    Social History   Socioeconomic History  . Marital status: Married    Spouse name: Not on file  . Number of children: Not on file  . Years of education: Not on file  . Highest education  level: Not on file  Occupational History  . Not on file  Social Needs  . Financial resource strain: Not on file  . Food insecurity:    Worry: Not on file    Inability: Not on file  . Transportation needs:    Medical: Not on file    Non-medical: Not on file  Tobacco Use  . Smoking status: Former Smoker    Packs/day: 0.50    Types: Cigarettes  . Smokeless tobacco: Former Neurosurgeon    Quit date: 06/16/2015  Substance and Sexual Activity  . Alcohol use: No    Alcohol/week: 0.0 oz  . Drug use: No  . Sexual activity: Not on file  Lifestyle  . Physical activity:    Days per week: Not on file    Minutes per session: Not on file  . Stress: Not on file  Relationships  . Social connections:    Talks on phone: Not on file    Gets together: Not on file    Attends religious service: Not on file    Active member of club or organization: Not on file    Attends meetings of clubs or organizations: Not on file    Relationship status: Not on file  Other Topics Concern  . Not on file  Social History Narrative  .  Not on file   History reviewed. No pertinent surgical history. Past Medical History:  Diagnosis Date  . Hx of renal calculi    There were no vitals taken for this visit.  Opioid Risk Score:   Fall Risk Score:  `1  Depression screen PHQ 2/9  Depression screen [facility] 2/9 12/26/2015 08/09/2015 08/09/2015  Decreased Interest 0 0 0  Down, Depressed, Hopeless 1 1 0  PHQ - 2 Score 1 1 0    Review of Systems  Constitutional: Negative.   Eyes: Negative.   Respiratory: Negative.   Cardiovascular: Negative.   Gastrointestinal: Negative.   Endocrine: Negative.   Genitourinary: Negative.   Musculoskeletal: Negative.   Skin: Negative.   Allergic/Immunologic: Negative.   Neurological: Negative.   Hematological: Negative.   Psychiatric/Behavioral: Positive for dysphoric mood. The patient is nervous/anxious.   All other systems reviewed and are negative.      Objective:    Physical Exam  General: No acute distress HEENT: EOMI, oral membranes moist Cards: reg rate  Chest: normal effort Abdomen: Soft, NT, ND Skin: dry, intact Extremities: no edema Musculoskeletal: He exhibits tenderness. Minimal edema in LUE.  Neurological: He is alert and oriented. able to open lid to about 90% currently on left.. Left pupil dilated and now dose constrict slightly to light. Tracks almost completely in medial/lateral planes. Does lack some upward movement on left eye. Able to see OS.  Right facial weakness with mild to moderate dysarthria and right tongue deviation. RUE: 3+/5 shoulder, 3+biceps, triceps, 2+ to 3 finger and wrist flexion, 2extension/ 2 HI. Tone right wrist/finger flexors/pronators 1to 1+/4. RLE: grossly 4+/5 HF,4/5 KE, 4/5 ADF/APF. 5/5 on the left upper and left lower extremity. Motor exam stable.  Gait generally functional with AFO. Still circumducts. Sensation intact to light touch.  Psychiatric: His affect is very flat today   Assessment & Plan:  1. 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 ?Reactive depression/imsomnia:  -still have concerns about mood/depression.  I asked him to call me if he would like to resume an antidepressant.  I think that would be in his best interest to do so.  However I told him I would not force him to start the medication. 3. Left retrobulbar hemorrhage with RAPD: had had substantial natural recovery  -per optho.  4. Spasticity: tone in RLE improved with AFO and increase mobility, stretching with therapies and on his own-  -HEP  Gait is functional with AFO   5. Right knee and hip pain---have reviewed mechanics and appropriate technique with pt. -needs to continue wearing AFO   -naproxen 220mg  OTC bid prn   15minutes of face to face patient care time were spent during this visit. All questions were encouraged and answered.  Extensive was spent completing  paperwork for insurance company today.

## 2018-06-02 ENCOUNTER — Encounter: Payer: Self-pay | Admitting: Physical Medicine & Rehabilitation

## 2018-06-02 ENCOUNTER — Encounter: Payer: Medicaid Other | Attending: Physical Medicine & Rehabilitation | Admitting: Physical Medicine & Rehabilitation

## 2018-06-02 VITALS — BP 118/64 | HR 77 | Resp 14 | Ht 71.0 in | Wt 221.0 lb

## 2018-06-02 DIAGNOSIS — F329 Major depressive disorder, single episode, unspecified: Secondary | ICD-10-CM | POA: Insufficient documentation

## 2018-06-02 DIAGNOSIS — Z87820 Personal history of traumatic brain injury: Secondary | ICD-10-CM

## 2018-06-02 DIAGNOSIS — M25561 Pain in right knee: Secondary | ICD-10-CM | POA: Insufficient documentation

## 2018-06-02 DIAGNOSIS — Z87891 Personal history of nicotine dependence: Secondary | ICD-10-CM | POA: Insufficient documentation

## 2018-06-02 DIAGNOSIS — G8111 Spastic hemiplegia affecting right dominant side: Secondary | ICD-10-CM | POA: Insufficient documentation

## 2018-06-02 DIAGNOSIS — M25551 Pain in right hip: Secondary | ICD-10-CM | POA: Insufficient documentation

## 2018-06-02 MED ORDER — CITALOPRAM HYDROBROMIDE 10 MG PO TABS: 10.0000 mg | ORAL_TABLET | Freq: Every day | ORAL | 2 refills | Status: AC

## 2018-06-02 NOTE — Patient Instructions (Signed)
PLEASE FEEL FREE TO CALL OUR OFFICE WITH ANY PROBLEMS OR QUESTIONS ([phone])      

## 2018-06-02 NOTE — Progress Notes (Signed)
Subjective:    Patient ID: Dakota [name], male    DOB: 1986/01/22, 32 y.o.   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 He states that things are "about the same". His right knee and hip are manageable. He is sleeping fairly well. His mood "comes and goes" as he feels that he doesn't have any patience with anything. He went to live with his father for a couple months, having just come back a few days ago. The behavior has been stressful for him and his wife. He has been seeing a counselor which he thinks has helped a little with his ability to cope with things.    Pain Inventory Average Pain 6 Pain Right Now 1 My pain is sharp and burning  In the last 24 hours, has pain interfered with the following? General activity 5 Relation with others 5 Enjoyment of life 5 What TIME of day is your pain at its worst? daytime Sleep (in general) Fair  Pain is worse with: walking, bending, sitting, standing and some activites Pain improves with: rest Relief from Meds: .  Mobility how many minutes can you walk? 20 ability to climb steps?  yes do you drive?  yes  Function disabled: date disabled . I need assistance with the following:  dressing, meal prep, household duties and shopping  Neuro/Psych No problems in this area  Prior Studies Any changes since last visit?  no  Physicians involved in your care Any changes since last visit?  no   Family History  Problem Relation Age of Onset  . Healthy Mother   . Healthy Father   . Cancer Maternal Grandmother   . Healthy Maternal Grandfather   . Heart disease Paternal Grandfather    Social History   Socioeconomic History  . Marital status: Married    Spouse name: Not on file  . Number of children: Not on file  . Years of education: Not on file  . Highest education level: Not on file  Occupational History  . Not on file  Social Needs  . Financial resource strain: Not on file  . Food  insecurity:    Worry: Not on file    Inability: Not on file  . Transportation needs:    Medical: Not on file    Non-medical: Not on file  Tobacco Use  . Smoking status: Former Smoker    Packs/day: 0.50    Types: Cigarettes  . Smokeless tobacco: Former NeurosurgeonUser    Quit date: 06/16/2015  Substance and Sexual Activity  . Alcohol use: No    Alcohol/week: 0.0 standard drinks  . Drug use: No  . Sexual activity: Not on file  Lifestyle  . Physical activity:    Days per week: Not on file    Minutes per session: Not on file  . Stress: Not on file  Relationships  . Social connections:    Talks on phone: Not on file    Gets together: Not on file    Attends religious service: Not on file    Active member of club or organization: Not on file    Attends meetings of clubs or organizations: Not on file    Relationship status: Not on file  Other Topics Concern  . Not on file  Social History Narrative  . Not on file   History reviewed. No pertinent surgical history. Past Medical History:  Diagnosis Date  . Hx of renal calculi  BP 118/64 (BP Location: Left Arm, Patient Position: Sitting, Cuff Size: Normal)   Pulse 77   Resp 14   Ht 5\' 11"  (1.803 m)   Wt 221 lb (100.2 kg)   SpO2 94%   BMI 30.82 kg/m   Opioid Risk Score:   Fall Risk Score:  `1  Depression screen PHQ 2/9  Depression screen [facility] 2/9 12/26/2015 08/09/2015 08/09/2015  Decreased Interest 0 0 0  Down, Depressed, Hopeless 1 1 0  PHQ - 2 Score 1 1 0    Review of Systems  Constitutional: Negative.   HENT: Negative.   Eyes: Negative.   Respiratory: Negative.   Cardiovascular: Negative.   Gastrointestinal: Negative.   Endocrine: Negative.   Genitourinary: Negative.   Musculoskeletal: Positive for arthralgias.  Skin: Negative.   Neurological: Negative.   Hematological: Negative.   Psychiatric/Behavioral: Negative.        Objective:   Physical Exam  General: No acute distress HEENT: EOMI, oral membranes  moist Cards: reg rate  Chest: normal effort Abdomen: Soft, NT, ND Skin: dry, intact Extremities: no edema Musculoskeletal: He exhibits tenderness. Minimal edema in LUE.  Neurological: He is alert and oriented. able to open lid to about 90% currently on left.. Left pupil constricted, OS tracks better Does lack some upward movement on left eye. Able to see OS.  Mild right facial droop. RUE: 4/5 shoulder, 4 biceps, triceps, 3 finger and wrist flexion,   . RLE: grossly 4+/5 HF,4/5 KE, 4/5 ADF/APF. 5/5 on the left upper and left lower extremity. Motor exam stable.  Gait improved with improved clearance of right foot. Minimal recurvatum at knee  Psychiatric: His affect is flat but more dynamic   Assessment & Plan:  1. 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 ?Reactive depression/imsomnia: -discussed his mood at length. It has been an issue since his injury. We discussed how is depression in the setting of the TBI could result in anxiety and anger as well -continue counseling -trial of celexa 10mg  Q[facility]. 3. Left retrobulbar hemorrhage with RAPD: had had substantial natural recovery  -follow up per optho.  4. Spasticity: manageable 5. Right knee and hip pain--improved with better gait mechanics   of face to face patient care time were spent during this visit. All questions were encouraged and answered.

## 2018-08-11 ENCOUNTER — Encounter: Payer: Medicare Other | Admitting: Physical Medicine & Rehabilitation

## 2020-02-16 ENCOUNTER — Emergency Department (HOSPITAL_COMMUNITY)
Admission: EM | Admit: 2020-02-16 | Discharge: 2020-02-16 | Disposition: A | Discharge status: Home / Self Care | Payer: Medicare Other | Attending: Emergency Medicine | Admitting: Emergency Medicine

## 2020-02-16 ENCOUNTER — Encounter (HOSPITAL_COMMUNITY): Payer: Self-pay | Admitting: Emergency Medicine

## 2020-02-16 DIAGNOSIS — Z5321 Procedure and treatment not carried out due to patient leaving prior to being seen by health care provider: Secondary | ICD-10-CM | POA: Insufficient documentation

## 2020-02-16 DIAGNOSIS — R1032 Left lower quadrant pain: Secondary | ICD-10-CM | POA: Insufficient documentation

## 2020-02-16 LAB — URINALYSIS, ROUTINE W REFLEX MICROSCOPIC
Bacteria, UA: NONE SEEN
Bilirubin Urine: NEGATIVE
Glucose, UA: NEGATIVE mg/dL
Ketones, ur: NEGATIVE mg/dL
Leukocytes,Ua: NEGATIVE
Nitrite: NEGATIVE
Protein, ur: NEGATIVE mg/dL
RBC / HPF: >50 RBC/hpf — ABNORMAL HIGH (ref 0–5)
Specific Gravity, Urine: 1.013 (ref 1.005–1.030)
pH: 6 (ref 5.0–8.0)

## 2020-02-16 NOTE — ED Triage Notes (Signed)
Pt states he woke up at 0600 with a burning in left flank hx of kidney stones, pain has been constant

## 2020-02-16 NOTE — ED Notes (Signed)
Pt called for room x3, no answer 

## 2023-07-10 ENCOUNTER — Ambulatory Visit: Payer: Self-pay | Admitting: Family Medicine

## 2024-05-19 ENCOUNTER — Ambulatory Visit: Payer: Self-pay

## 2024-05-19 NOTE — Telephone Encounter (Signed)
 Pt father calling, states that pt needs appt ASAP for severe low back pain, possible kidney stones as well as possible gallstones, pt gets upset stomach after eating greasy foods. Father states that he would like appt to be at the [facility] location, next available new pt appt is October.  Pt father states that is to far out, caller advised that RN can schedule at other [facility] clinics. Caller states, let me talk to his mother, she is also working on this and may have found something sooner. Caller advised that pt would have to utilize UC or ED for the severe low back pain.  Copied from CRM #[id number]. Topic: Clinical - Red Word Triage >> May 19, 2024  8:49 AM Viola FALCON wrote: Red Word that prompted transfer to Nurse Triage: Patient father Vinie called regarding patients pain in lower back - says he may have kidney stones/gall stones - new patient
